# Patient Record
Sex: Female | Born: 1935 | Race: Black or African American | Hispanic: No | Smoking: Never smoker
Health system: Southern US, Community
[De-identification: ages and names within clinical notes are randomized; demographics above are authoritative.]

## PROBLEM LIST (undated history)

## (undated) DIAGNOSIS — E785 Hyperlipidemia, unspecified: Secondary | ICD-10-CM

## (undated) DIAGNOSIS — T7840XA Allergy, unspecified, initial encounter: Secondary | ICD-10-CM

## (undated) DIAGNOSIS — M1990 Unspecified osteoarthritis, unspecified site: Secondary | ICD-10-CM

## (undated) DIAGNOSIS — E119 Type 2 diabetes mellitus without complications: Secondary | ICD-10-CM

## (undated) DIAGNOSIS — I10 Essential (primary) hypertension: Secondary | ICD-10-CM

## (undated) HISTORY — DX: Hyperlipidemia, unspecified: E78.5

## (undated) HISTORY — DX: Unspecified osteoarthritis, unspecified site: M19.90

## (undated) HISTORY — DX: Essential (primary) hypertension: I10

## (undated) HISTORY — DX: Type 2 diabetes mellitus without complications: E11.9

## (undated) HISTORY — PX: BUNIONECTOMY: SHX129

## (undated) HISTORY — PX: COLONOSCOPY: SHX174

## (undated) HISTORY — DX: Allergy, unspecified, initial encounter: T78.40XA

## (undated) HISTORY — PX: ABDOMINAL HYSTERECTOMY: SHX81

## (undated) HISTORY — PX: HERNIA REPAIR: SHX51

---

## 2005-02-02 ENCOUNTER — Ambulatory Visit: Payer: Self-pay | Admitting: Family Medicine

## 2006-06-26 DIAGNOSIS — I10 Essential (primary) hypertension: Secondary | ICD-10-CM | POA: Insufficient documentation

## 2006-06-26 DIAGNOSIS — E7800 Pure hypercholesterolemia, unspecified: Secondary | ICD-10-CM | POA: Insufficient documentation

## 2007-09-06 ENCOUNTER — Ambulatory Visit: Payer: Self-pay | Admitting: Family Medicine

## 2007-09-06 IMAGING — CR DG CHEST 2V
1 series · 2 of 2 positions shown · non-contrast
Comparison: none

REASON FOR EXAM: bronchitis and cough
COMMENTS:

[Series 1: view not recorded · 0.17mm/px · 2 of 2 slices shown]
[im 1/2]
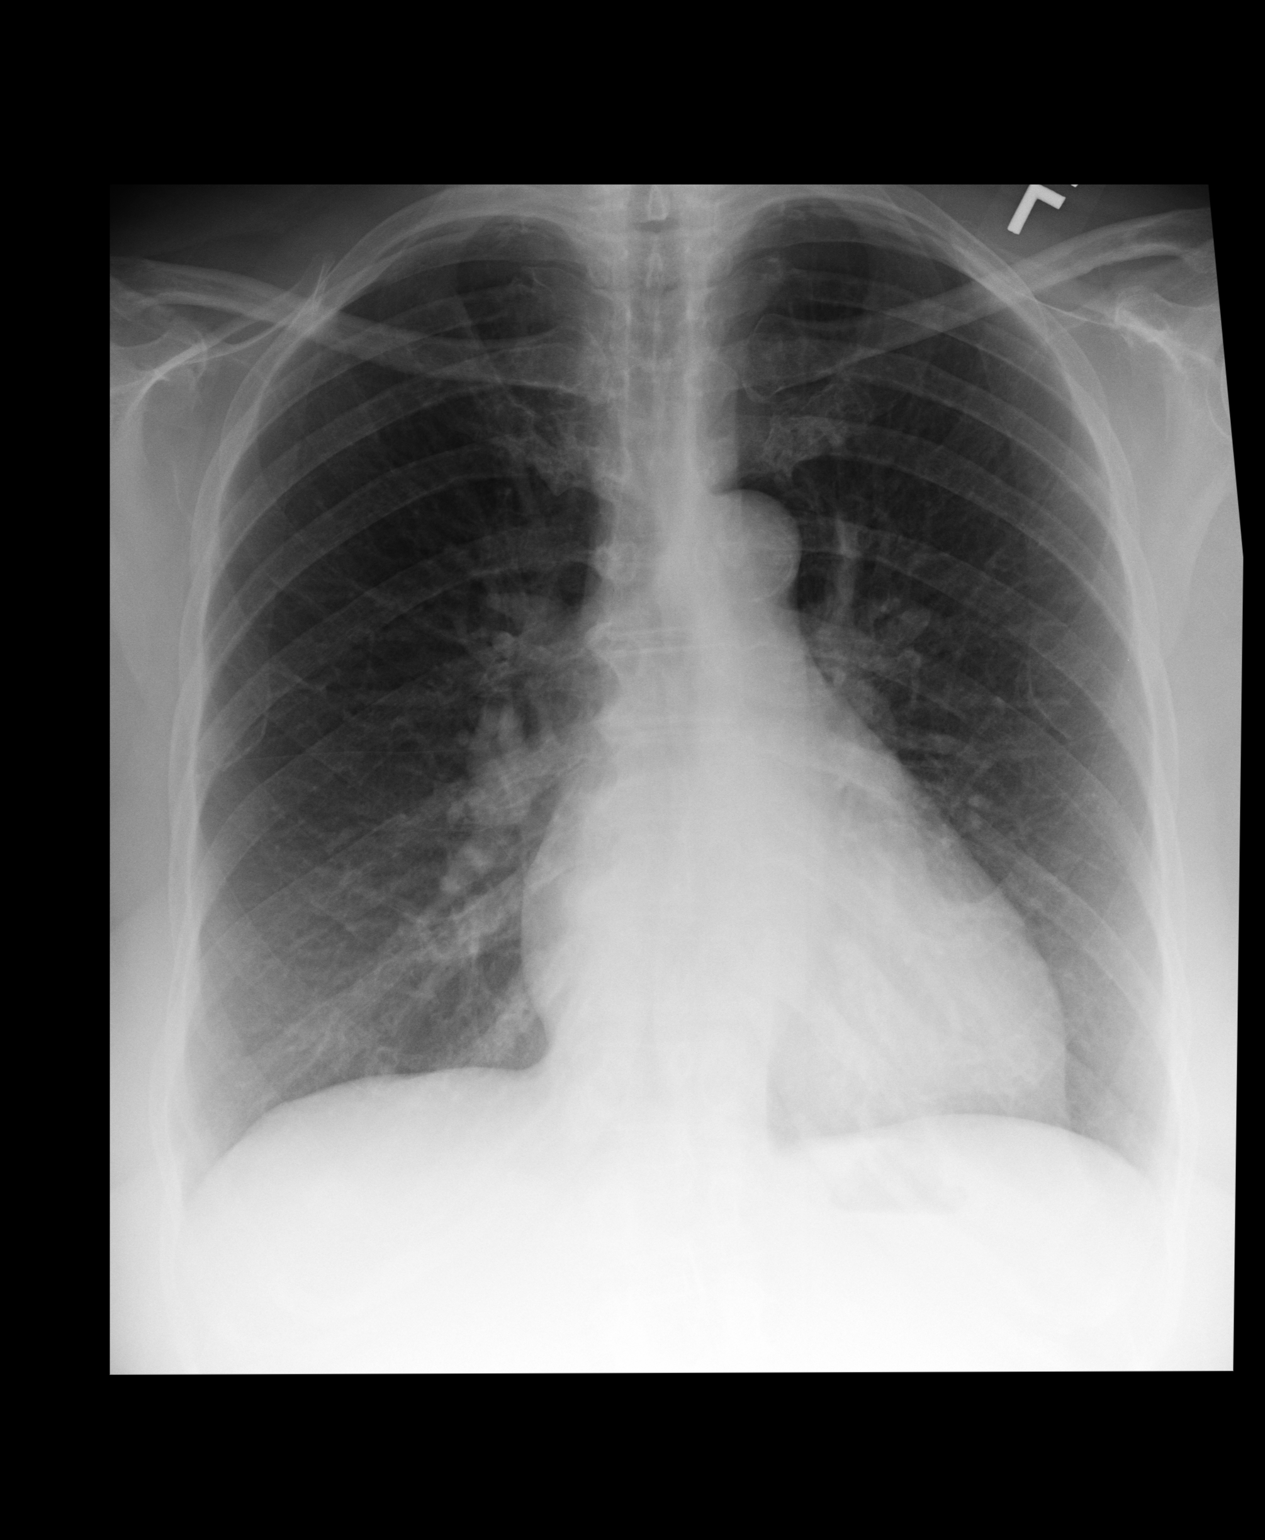
[im 2/2]
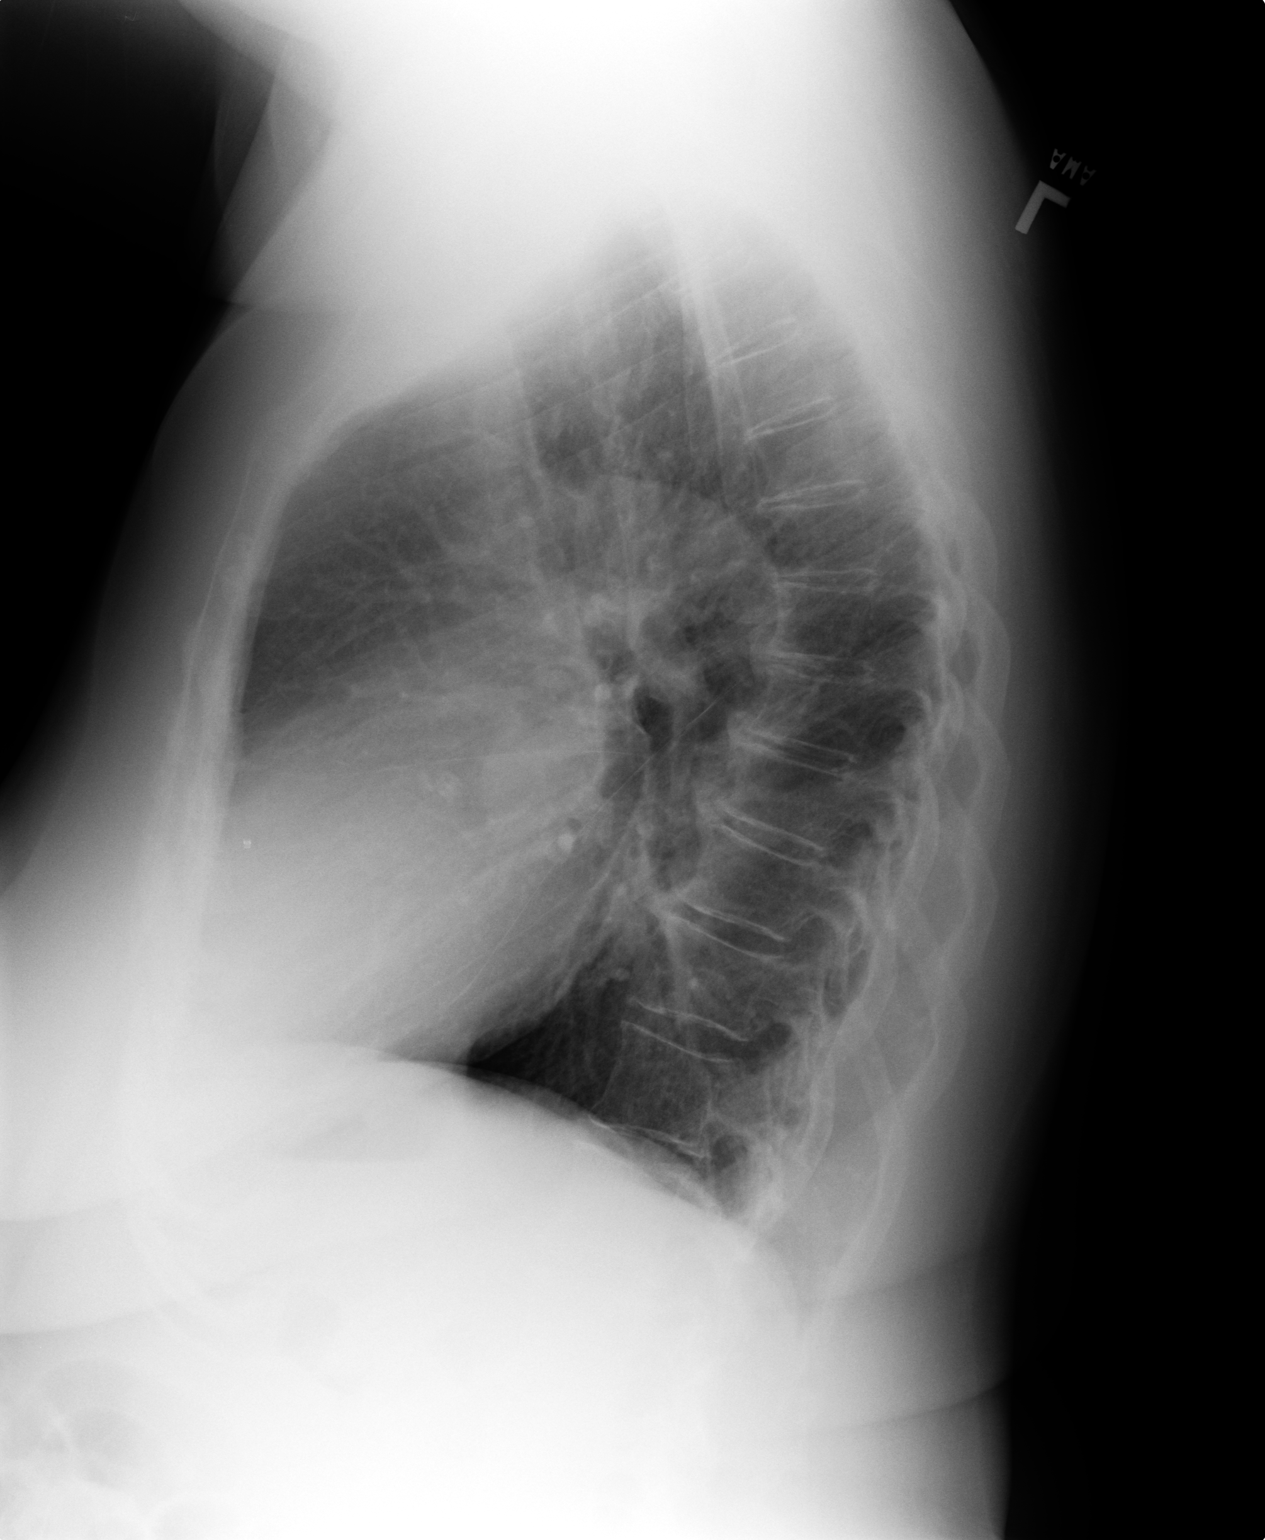

[2 of 2 positions shown; findings below may reference images not displayed]

PROCEDURE:     KDR - KDXR CHEST PA (OR AP) AND LAT  - [DATE] [DATE]

RESULT:     There is no previous exam for comparison. The heart is
borderline enlarged. Degenerative spurring is present within the spine.
Apical fibrotic changes are present in both lungs. There is no infiltrate,
effusion or pneumothorax. There is mild hyperinflation.
IMPRESSION: 1. Borderline cardiomegaly.
2. Degenerative spurring in the spine.
3. Mild hyperinflation.

## 2008-01-02 ENCOUNTER — Ambulatory Visit: Payer: Self-pay | Admitting: Ophthalmology

## 2008-01-09 ENCOUNTER — Ambulatory Visit: Payer: Self-pay | Admitting: Ophthalmology

## 2009-01-20 ENCOUNTER — Ambulatory Visit: Payer: Self-pay | Admitting: Ophthalmology

## 2009-02-03 ENCOUNTER — Ambulatory Visit: Payer: Self-pay | Admitting: Ophthalmology

## 2009-03-05 ENCOUNTER — Ambulatory Visit: Payer: Self-pay | Admitting: Ophthalmology

## 2009-03-17 ENCOUNTER — Ambulatory Visit: Payer: Self-pay | Admitting: Ophthalmology

## 2009-05-29 ENCOUNTER — Ambulatory Visit: Payer: Self-pay | Admitting: Ophthalmology

## 2009-06-10 ENCOUNTER — Ambulatory Visit: Payer: Self-pay | Admitting: Ophthalmology

## 2011-01-05 DIAGNOSIS — M5137 Other intervertebral disc degeneration, lumbosacral region: Secondary | ICD-10-CM | POA: Diagnosis not present

## 2011-01-05 DIAGNOSIS — M999 Biomechanical lesion, unspecified: Secondary | ICD-10-CM | POA: Diagnosis not present

## 2011-01-05 DIAGNOSIS — M546 Pain in thoracic spine: Secondary | ICD-10-CM | POA: Diagnosis not present

## 2011-01-28 DIAGNOSIS — B351 Tinea unguium: Secondary | ICD-10-CM | POA: Diagnosis not present

## 2011-01-28 DIAGNOSIS — L851 Acquired keratosis [keratoderma] palmaris et plantaris: Secondary | ICD-10-CM | POA: Diagnosis not present

## 2011-01-28 DIAGNOSIS — E119 Type 2 diabetes mellitus without complications: Secondary | ICD-10-CM | POA: Diagnosis not present

## 2011-02-08 DIAGNOSIS — M546 Pain in thoracic spine: Secondary | ICD-10-CM | POA: Diagnosis not present

## 2011-02-08 DIAGNOSIS — M999 Biomechanical lesion, unspecified: Secondary | ICD-10-CM | POA: Diagnosis not present

## 2011-02-08 DIAGNOSIS — M5137 Other intervertebral disc degeneration, lumbosacral region: Secondary | ICD-10-CM | POA: Diagnosis not present

## 2011-02-15 DIAGNOSIS — M546 Pain in thoracic spine: Secondary | ICD-10-CM | POA: Diagnosis not present

## 2011-02-15 DIAGNOSIS — M5137 Other intervertebral disc degeneration, lumbosacral region: Secondary | ICD-10-CM | POA: Diagnosis not present

## 2011-02-15 DIAGNOSIS — M999 Biomechanical lesion, unspecified: Secondary | ICD-10-CM | POA: Diagnosis not present

## 2011-02-23 DIAGNOSIS — M546 Pain in thoracic spine: Secondary | ICD-10-CM | POA: Diagnosis not present

## 2011-02-23 DIAGNOSIS — M999 Biomechanical lesion, unspecified: Secondary | ICD-10-CM | POA: Diagnosis not present

## 2011-02-23 DIAGNOSIS — M5137 Other intervertebral disc degeneration, lumbosacral region: Secondary | ICD-10-CM | POA: Diagnosis not present

## 2011-03-02 DIAGNOSIS — M546 Pain in thoracic spine: Secondary | ICD-10-CM | POA: Diagnosis not present

## 2011-03-02 DIAGNOSIS — M999 Biomechanical lesion, unspecified: Secondary | ICD-10-CM | POA: Diagnosis not present

## 2011-03-02 DIAGNOSIS — M5137 Other intervertebral disc degeneration, lumbosacral region: Secondary | ICD-10-CM | POA: Diagnosis not present

## 2011-03-09 DIAGNOSIS — M999 Biomechanical lesion, unspecified: Secondary | ICD-10-CM | POA: Diagnosis not present

## 2011-03-09 DIAGNOSIS — M5137 Other intervertebral disc degeneration, lumbosacral region: Secondary | ICD-10-CM | POA: Diagnosis not present

## 2011-03-09 DIAGNOSIS — M546 Pain in thoracic spine: Secondary | ICD-10-CM | POA: Diagnosis not present

## 2011-03-11 DIAGNOSIS — E1139 Type 2 diabetes mellitus with other diabetic ophthalmic complication: Secondary | ICD-10-CM | POA: Diagnosis not present

## 2011-03-11 DIAGNOSIS — E11359 Type 2 diabetes mellitus with proliferative diabetic retinopathy without macular edema: Secondary | ICD-10-CM | POA: Diagnosis not present

## 2011-03-16 DIAGNOSIS — M999 Biomechanical lesion, unspecified: Secondary | ICD-10-CM | POA: Diagnosis not present

## 2011-03-16 DIAGNOSIS — M5137 Other intervertebral disc degeneration, lumbosacral region: Secondary | ICD-10-CM | POA: Diagnosis not present

## 2011-03-16 DIAGNOSIS — M546 Pain in thoracic spine: Secondary | ICD-10-CM | POA: Diagnosis not present

## 2011-03-30 DIAGNOSIS — M5137 Other intervertebral disc degeneration, lumbosacral region: Secondary | ICD-10-CM | POA: Diagnosis not present

## 2011-03-30 DIAGNOSIS — M546 Pain in thoracic spine: Secondary | ICD-10-CM | POA: Diagnosis not present

## 2011-03-30 DIAGNOSIS — M999 Biomechanical lesion, unspecified: Secondary | ICD-10-CM | POA: Diagnosis not present

## 2011-04-04 DIAGNOSIS — E119 Type 2 diabetes mellitus without complications: Secondary | ICD-10-CM | POA: Diagnosis not present

## 2011-04-04 DIAGNOSIS — B351 Tinea unguium: Secondary | ICD-10-CM | POA: Diagnosis not present

## 2011-04-04 DIAGNOSIS — L851 Acquired keratosis [keratoderma] palmaris et plantaris: Secondary | ICD-10-CM | POA: Diagnosis not present

## 2011-04-05 DIAGNOSIS — M546 Pain in thoracic spine: Secondary | ICD-10-CM | POA: Diagnosis not present

## 2011-04-05 DIAGNOSIS — M999 Biomechanical lesion, unspecified: Secondary | ICD-10-CM | POA: Diagnosis not present

## 2011-04-05 DIAGNOSIS — M5137 Other intervertebral disc degeneration, lumbosacral region: Secondary | ICD-10-CM | POA: Diagnosis not present

## 2011-04-13 DIAGNOSIS — M999 Biomechanical lesion, unspecified: Secondary | ICD-10-CM | POA: Diagnosis not present

## 2011-04-13 DIAGNOSIS — M546 Pain in thoracic spine: Secondary | ICD-10-CM | POA: Diagnosis not present

## 2011-04-13 DIAGNOSIS — M5137 Other intervertebral disc degeneration, lumbosacral region: Secondary | ICD-10-CM | POA: Diagnosis not present

## 2011-04-19 DIAGNOSIS — E119 Type 2 diabetes mellitus without complications: Secondary | ICD-10-CM | POA: Diagnosis not present

## 2011-04-19 DIAGNOSIS — J301 Allergic rhinitis due to pollen: Secondary | ICD-10-CM | POA: Diagnosis not present

## 2011-04-19 DIAGNOSIS — E7800 Pure hypercholesterolemia, unspecified: Secondary | ICD-10-CM | POA: Diagnosis not present

## 2011-04-19 DIAGNOSIS — I10 Essential (primary) hypertension: Secondary | ICD-10-CM | POA: Diagnosis not present

## 2011-04-27 DIAGNOSIS — M999 Biomechanical lesion, unspecified: Secondary | ICD-10-CM | POA: Diagnosis not present

## 2011-04-27 DIAGNOSIS — M546 Pain in thoracic spine: Secondary | ICD-10-CM | POA: Diagnosis not present

## 2011-04-27 DIAGNOSIS — M5137 Other intervertebral disc degeneration, lumbosacral region: Secondary | ICD-10-CM | POA: Diagnosis not present

## 2011-06-06 DIAGNOSIS — L851 Acquired keratosis [keratoderma] palmaris et plantaris: Secondary | ICD-10-CM | POA: Diagnosis not present

## 2011-06-06 DIAGNOSIS — B351 Tinea unguium: Secondary | ICD-10-CM | POA: Diagnosis not present

## 2011-06-06 DIAGNOSIS — E119 Type 2 diabetes mellitus without complications: Secondary | ICD-10-CM | POA: Diagnosis not present

## 2011-08-15 DIAGNOSIS — E119 Type 2 diabetes mellitus without complications: Secondary | ICD-10-CM | POA: Diagnosis not present

## 2011-08-15 DIAGNOSIS — B351 Tinea unguium: Secondary | ICD-10-CM | POA: Diagnosis not present

## 2011-08-15 DIAGNOSIS — L851 Acquired keratosis [keratoderma] palmaris et plantaris: Secondary | ICD-10-CM | POA: Diagnosis not present

## 2011-08-22 DIAGNOSIS — E7800 Pure hypercholesterolemia, unspecified: Secondary | ICD-10-CM | POA: Diagnosis not present

## 2011-08-22 DIAGNOSIS — I10 Essential (primary) hypertension: Secondary | ICD-10-CM | POA: Diagnosis not present

## 2011-08-22 DIAGNOSIS — M109 Gout, unspecified: Secondary | ICD-10-CM | POA: Diagnosis not present

## 2011-10-03 DIAGNOSIS — Z23 Encounter for immunization: Secondary | ICD-10-CM | POA: Diagnosis not present

## 2011-10-17 DIAGNOSIS — B351 Tinea unguium: Secondary | ICD-10-CM | POA: Diagnosis not present

## 2011-10-17 DIAGNOSIS — E119 Type 2 diabetes mellitus without complications: Secondary | ICD-10-CM | POA: Diagnosis not present

## 2011-10-17 DIAGNOSIS — L851 Acquired keratosis [keratoderma] palmaris et plantaris: Secondary | ICD-10-CM | POA: Diagnosis not present

## 2011-12-19 DIAGNOSIS — E119 Type 2 diabetes mellitus without complications: Secondary | ICD-10-CM | POA: Diagnosis not present

## 2011-12-19 DIAGNOSIS — L851 Acquired keratosis [keratoderma] palmaris et plantaris: Secondary | ICD-10-CM | POA: Diagnosis not present

## 2011-12-19 DIAGNOSIS — B351 Tinea unguium: Secondary | ICD-10-CM | POA: Diagnosis not present

## 2012-01-02 DIAGNOSIS — E7800 Pure hypercholesterolemia, unspecified: Secondary | ICD-10-CM | POA: Diagnosis not present

## 2012-01-02 DIAGNOSIS — E119 Type 2 diabetes mellitus without complications: Secondary | ICD-10-CM | POA: Diagnosis not present

## 2012-01-02 DIAGNOSIS — M109 Gout, unspecified: Secondary | ICD-10-CM | POA: Diagnosis not present

## 2012-01-02 DIAGNOSIS — I10 Essential (primary) hypertension: Secondary | ICD-10-CM | POA: Diagnosis not present

## 2012-01-10 DIAGNOSIS — E7800 Pure hypercholesterolemia, unspecified: Secondary | ICD-10-CM | POA: Diagnosis not present

## 2012-01-10 DIAGNOSIS — M109 Gout, unspecified: Secondary | ICD-10-CM | POA: Diagnosis not present

## 2012-02-20 DIAGNOSIS — L851 Acquired keratosis [keratoderma] palmaris et plantaris: Secondary | ICD-10-CM | POA: Diagnosis not present

## 2012-02-20 DIAGNOSIS — B351 Tinea unguium: Secondary | ICD-10-CM | POA: Diagnosis not present

## 2012-02-20 DIAGNOSIS — E119 Type 2 diabetes mellitus without complications: Secondary | ICD-10-CM | POA: Diagnosis not present

## 2012-04-23 DIAGNOSIS — E119 Type 2 diabetes mellitus without complications: Secondary | ICD-10-CM | POA: Diagnosis not present

## 2012-04-23 DIAGNOSIS — B351 Tinea unguium: Secondary | ICD-10-CM | POA: Diagnosis not present

## 2012-04-23 DIAGNOSIS — L851 Acquired keratosis [keratoderma] palmaris et plantaris: Secondary | ICD-10-CM | POA: Diagnosis not present

## 2012-05-29 IMAGING — CR DG HIP COMPLETE 2+V*L*
1 series · 3 of 3 positions shown · non-contrast
Comparison: None.

CLINICAL DATA: Lower back pain radiating into the left hip.

EXAM:
LEFT HIP - COMPLETE 2+ VIEW

[Series 1: ap · 0.17mm/px · 3 of 3 slices shown]
[im 1/3]
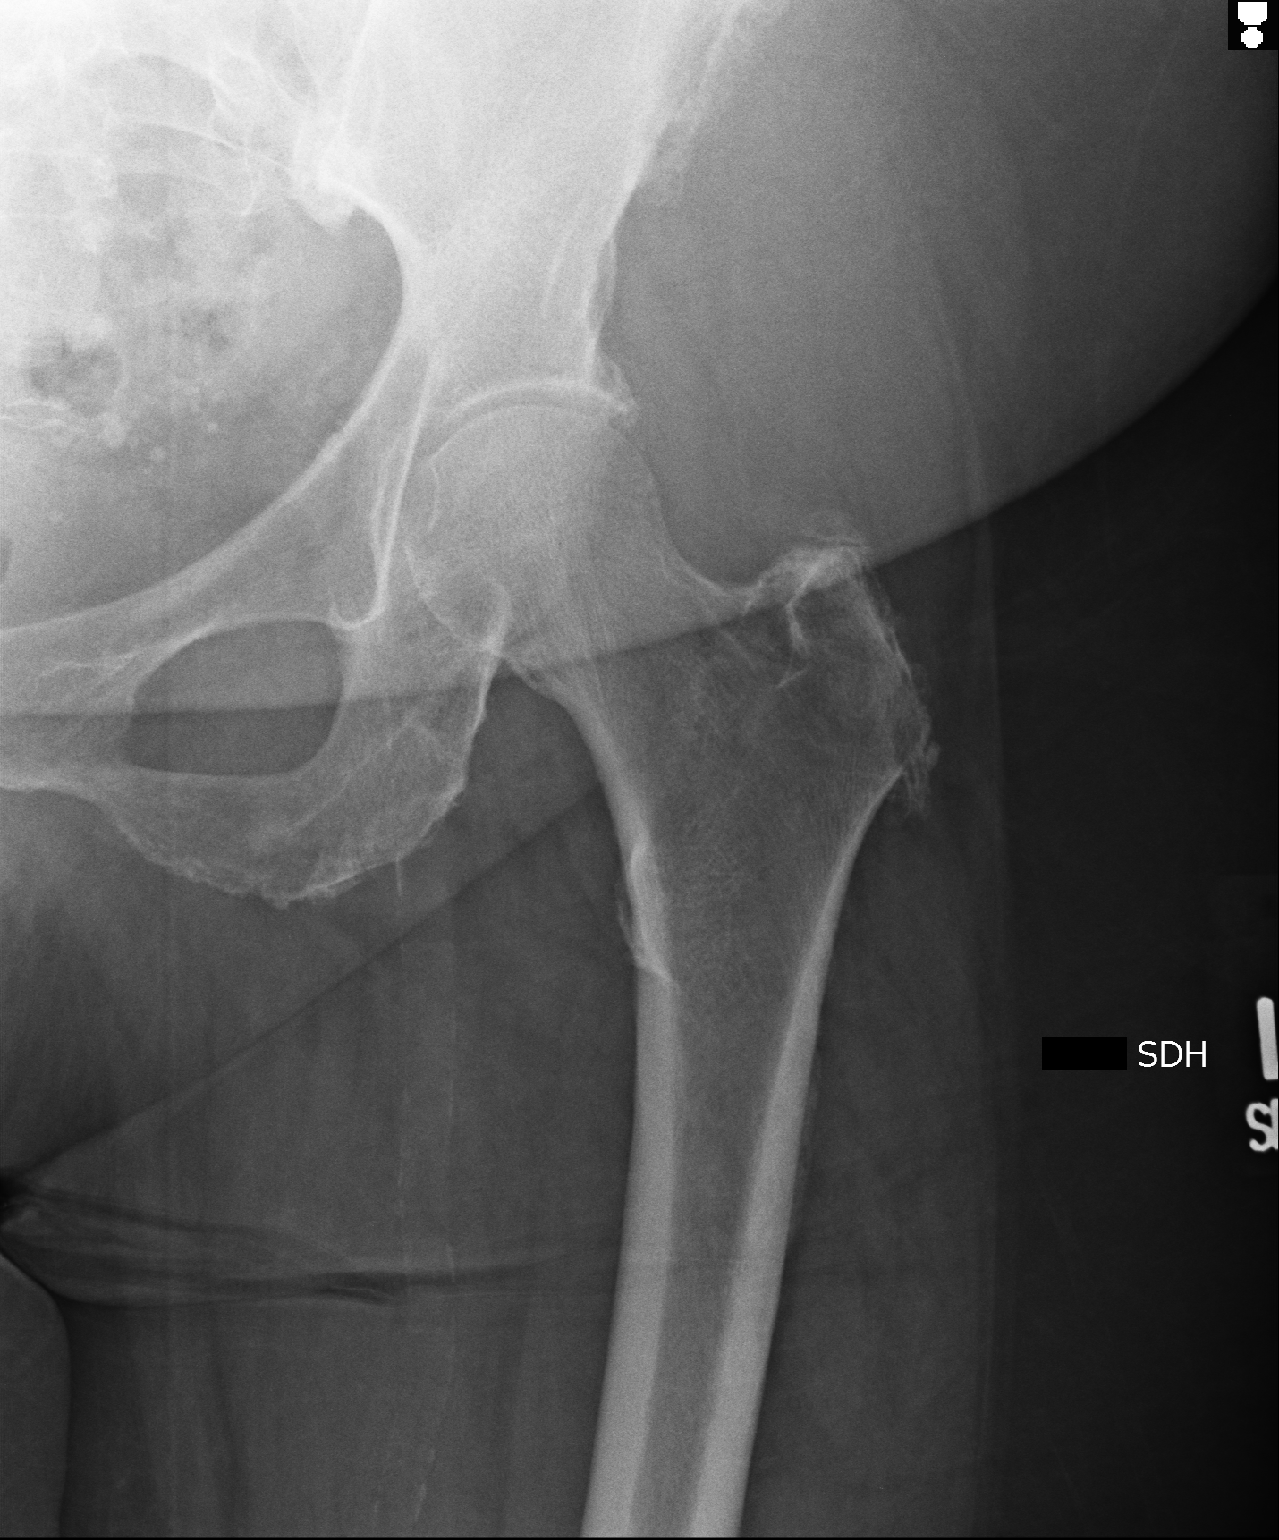
[im 2/3]
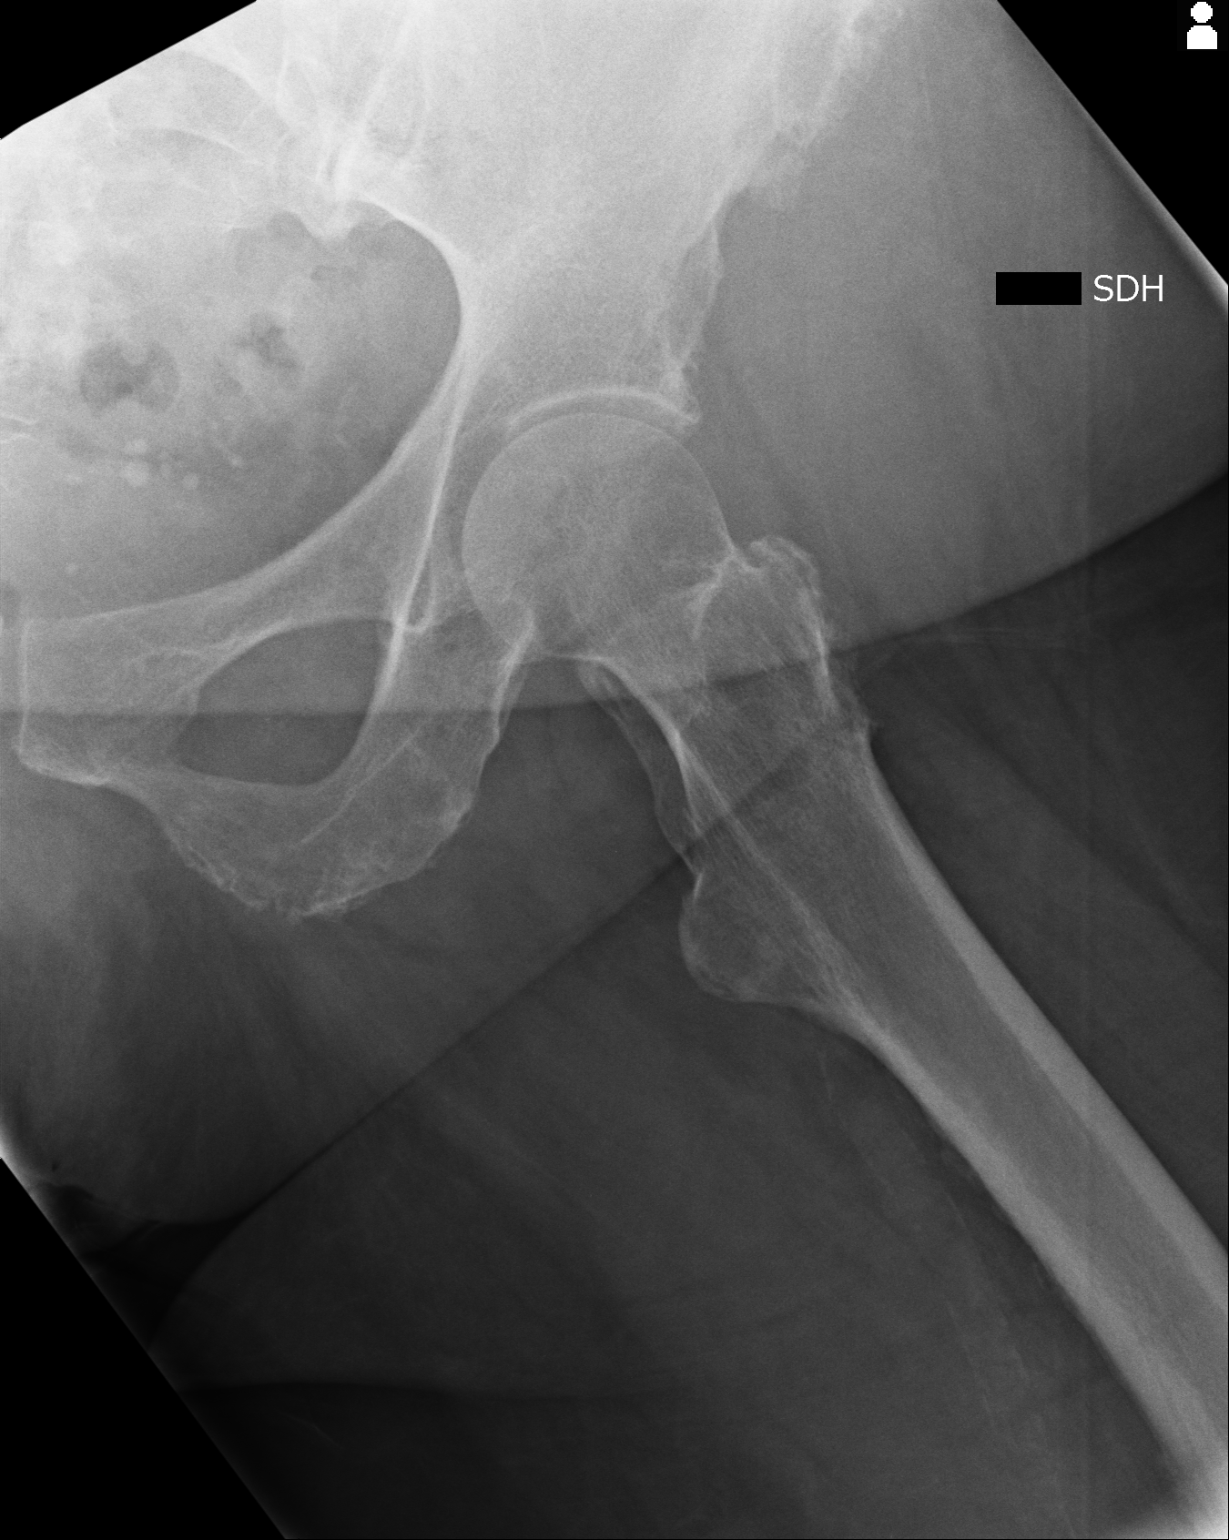
[im 3/3]
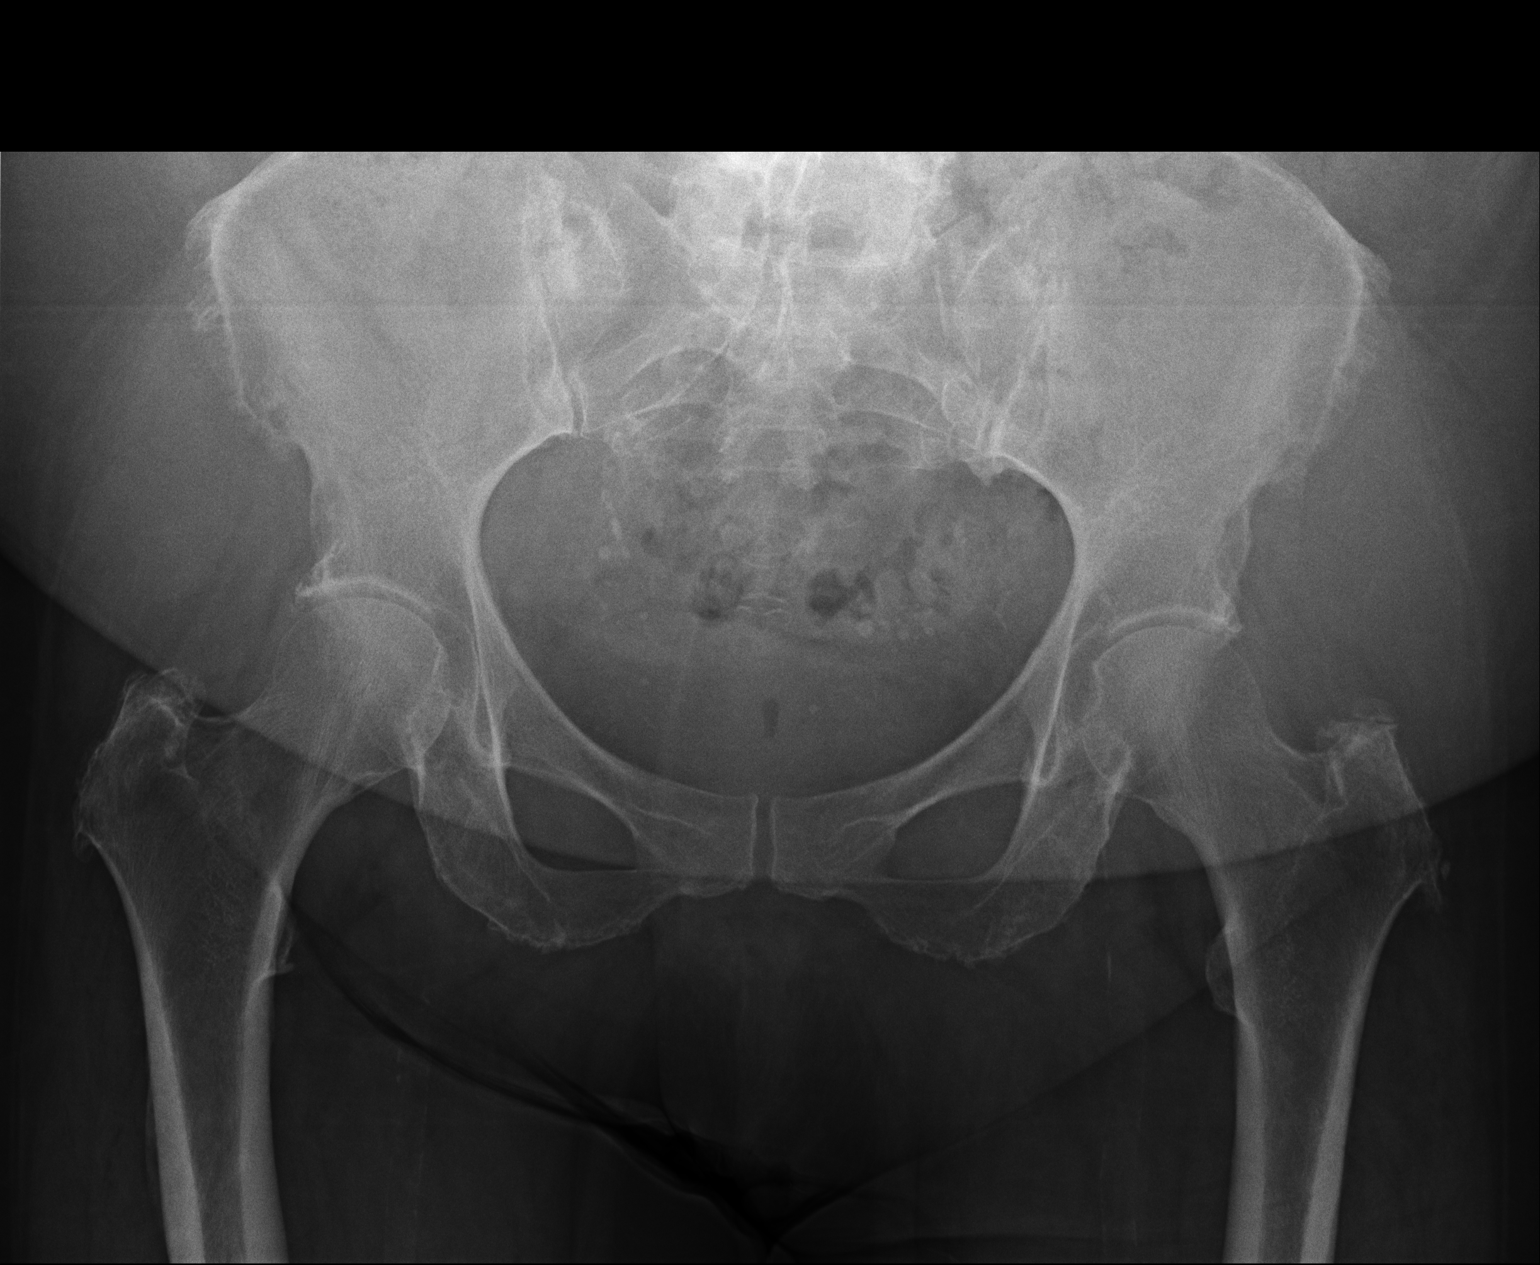

[3 of 3 positions shown; findings below may reference images not displayed]

FINDINGS: There is no evidence of hip fracture or dislocation. There is no
evidence of arthropathy or other focal bone abnormality.
Degenerative changes are seen in the lower lumbar spine. Phleboliths
are seen in the pelvis.
IMPRESSION: No evidence for acute  abnormality.

## 2012-06-25 DIAGNOSIS — E119 Type 2 diabetes mellitus without complications: Secondary | ICD-10-CM | POA: Diagnosis not present

## 2012-06-25 DIAGNOSIS — B351 Tinea unguium: Secondary | ICD-10-CM | POA: Diagnosis not present

## 2012-06-25 DIAGNOSIS — L851 Acquired keratosis [keratoderma] palmaris et plantaris: Secondary | ICD-10-CM | POA: Diagnosis not present

## 2012-08-28 ENCOUNTER — Ambulatory Visit: Payer: Self-pay | Admitting: Family Medicine

## 2012-08-28 DIAGNOSIS — R928 Other abnormal and inconclusive findings on diagnostic imaging of breast: Secondary | ICD-10-CM | POA: Diagnosis not present

## 2012-08-28 DIAGNOSIS — B351 Tinea unguium: Secondary | ICD-10-CM | POA: Diagnosis not present

## 2012-08-28 DIAGNOSIS — L851 Acquired keratosis [keratoderma] palmaris et plantaris: Secondary | ICD-10-CM | POA: Diagnosis not present

## 2012-08-28 DIAGNOSIS — Z1231 Encounter for screening mammogram for malignant neoplasm of breast: Secondary | ICD-10-CM | POA: Diagnosis not present

## 2012-08-28 DIAGNOSIS — E119 Type 2 diabetes mellitus without complications: Secondary | ICD-10-CM | POA: Diagnosis not present

## 2012-09-12 ENCOUNTER — Ambulatory Visit: Payer: Self-pay | Admitting: Family Medicine

## 2012-09-12 DIAGNOSIS — N63 Unspecified lump in unspecified breast: Secondary | ICD-10-CM | POA: Diagnosis not present

## 2012-10-02 DIAGNOSIS — L989 Disorder of the skin and subcutaneous tissue, unspecified: Secondary | ICD-10-CM | POA: Diagnosis not present

## 2012-10-02 DIAGNOSIS — Z23 Encounter for immunization: Secondary | ICD-10-CM | POA: Diagnosis not present

## 2012-10-17 DIAGNOSIS — L723 Sebaceous cyst: Secondary | ICD-10-CM | POA: Diagnosis not present

## 2012-10-30 DIAGNOSIS — B351 Tinea unguium: Secondary | ICD-10-CM | POA: Diagnosis not present

## 2012-10-30 DIAGNOSIS — E119 Type 2 diabetes mellitus without complications: Secondary | ICD-10-CM | POA: Diagnosis not present

## 2012-10-30 DIAGNOSIS — L851 Acquired keratosis [keratoderma] palmaris et plantaris: Secondary | ICD-10-CM | POA: Diagnosis not present

## 2013-01-08 DIAGNOSIS — L851 Acquired keratosis [keratoderma] palmaris et plantaris: Secondary | ICD-10-CM | POA: Diagnosis not present

## 2013-01-08 DIAGNOSIS — E669 Obesity, unspecified: Secondary | ICD-10-CM | POA: Diagnosis not present

## 2013-01-08 DIAGNOSIS — E7800 Pure hypercholesterolemia, unspecified: Secondary | ICD-10-CM | POA: Diagnosis not present

## 2013-01-08 DIAGNOSIS — B351 Tinea unguium: Secondary | ICD-10-CM | POA: Diagnosis not present

## 2013-01-08 DIAGNOSIS — E119 Type 2 diabetes mellitus without complications: Secondary | ICD-10-CM | POA: Diagnosis not present

## 2013-01-08 DIAGNOSIS — I10 Essential (primary) hypertension: Secondary | ICD-10-CM | POA: Diagnosis not present

## 2013-03-12 DIAGNOSIS — L851 Acquired keratosis [keratoderma] palmaris et plantaris: Secondary | ICD-10-CM | POA: Diagnosis not present

## 2013-03-12 DIAGNOSIS — B351 Tinea unguium: Secondary | ICD-10-CM | POA: Diagnosis not present

## 2013-03-12 DIAGNOSIS — E119 Type 2 diabetes mellitus without complications: Secondary | ICD-10-CM | POA: Diagnosis not present

## 2013-05-14 DIAGNOSIS — B351 Tinea unguium: Secondary | ICD-10-CM | POA: Diagnosis not present

## 2013-05-14 DIAGNOSIS — E119 Type 2 diabetes mellitus without complications: Secondary | ICD-10-CM | POA: Diagnosis not present

## 2013-05-14 DIAGNOSIS — E7800 Pure hypercholesterolemia, unspecified: Secondary | ICD-10-CM | POA: Diagnosis not present

## 2013-05-14 DIAGNOSIS — L851 Acquired keratosis [keratoderma] palmaris et plantaris: Secondary | ICD-10-CM | POA: Diagnosis not present

## 2013-05-14 DIAGNOSIS — I10 Essential (primary) hypertension: Secondary | ICD-10-CM | POA: Diagnosis not present

## 2013-06-10 ENCOUNTER — Ambulatory Visit: Payer: Self-pay | Admitting: Family Medicine

## 2013-06-10 DIAGNOSIS — R109 Unspecified abdominal pain: Secondary | ICD-10-CM | POA: Diagnosis not present

## 2013-06-10 DIAGNOSIS — M76899 Other specified enthesopathies of unspecified lower limb, excluding foot: Secondary | ICD-10-CM | POA: Diagnosis not present

## 2013-06-10 DIAGNOSIS — M25559 Pain in unspecified hip: Secondary | ICD-10-CM | POA: Diagnosis not present

## 2013-06-10 DIAGNOSIS — M5137 Other intervertebral disc degeneration, lumbosacral region: Secondary | ICD-10-CM | POA: Diagnosis not present

## 2013-06-10 DIAGNOSIS — M47817 Spondylosis without myelopathy or radiculopathy, lumbosacral region: Secondary | ICD-10-CM | POA: Diagnosis not present

## 2013-06-10 DIAGNOSIS — IMO0002 Reserved for concepts with insufficient information to code with codable children: Secondary | ICD-10-CM | POA: Diagnosis not present

## 2013-06-10 DIAGNOSIS — M4310 Spondylolisthesis, site unspecified: Secondary | ICD-10-CM | POA: Diagnosis not present

## 2013-06-10 IMAGING — CR DG LUMBAR SPINE COMPLETE 4+V
1 series · 5 of 5 positions shown · non-contrast
Comparison: None.

CLINICAL DATA: Lower back pain radiating into the left hip.

EXAM:
LUMBAR SPINE - COMPLETE 4+ VIEW

[Series 1: ap · 0.17mm/px · 5 of 5 slices shown]
[im 1/5]
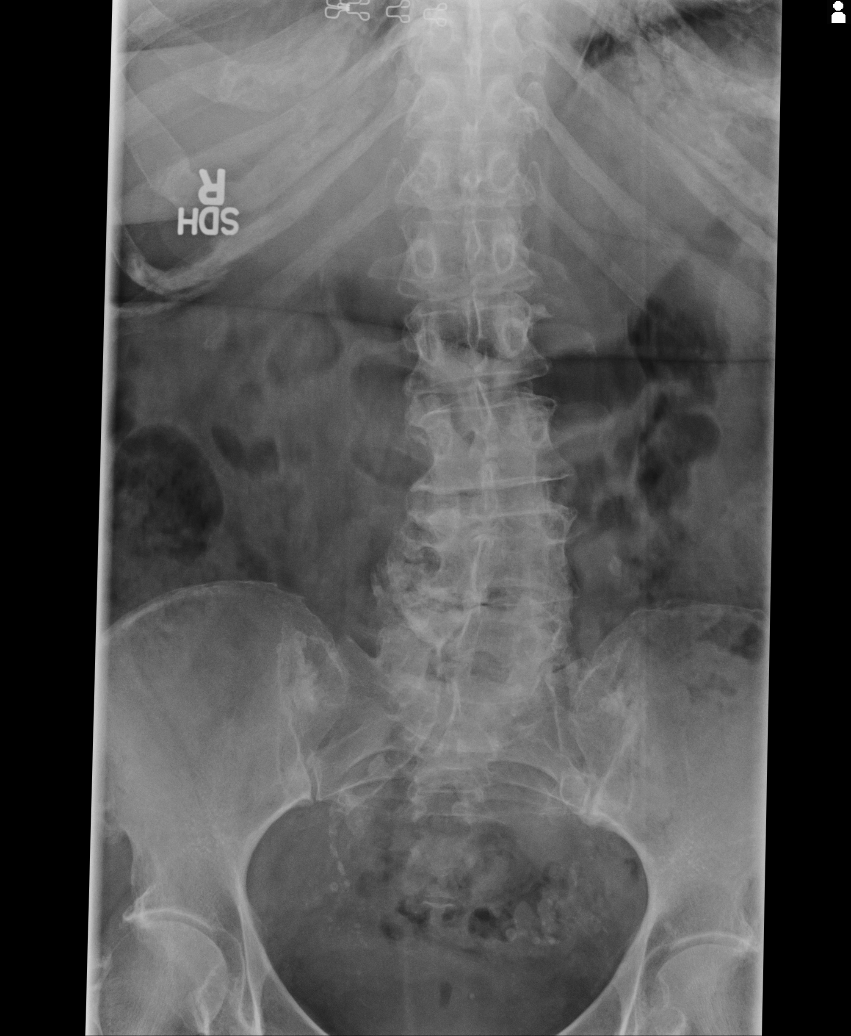
[im 2/5]
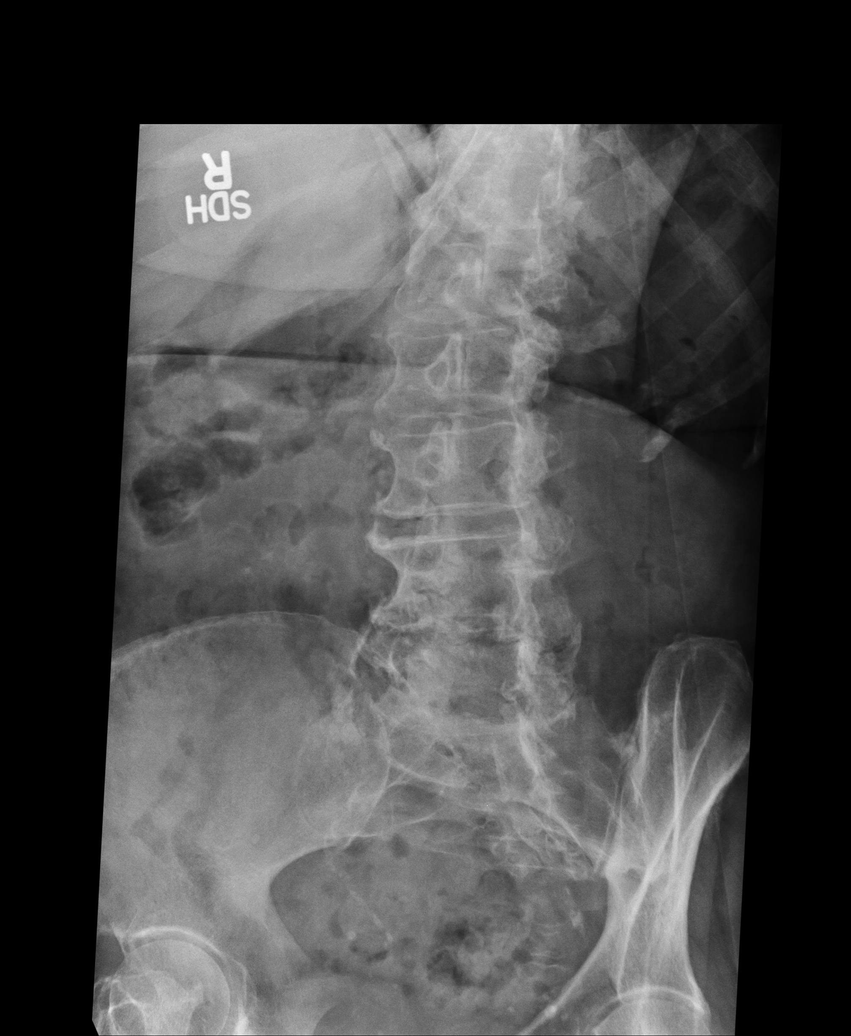
[im 3/5]
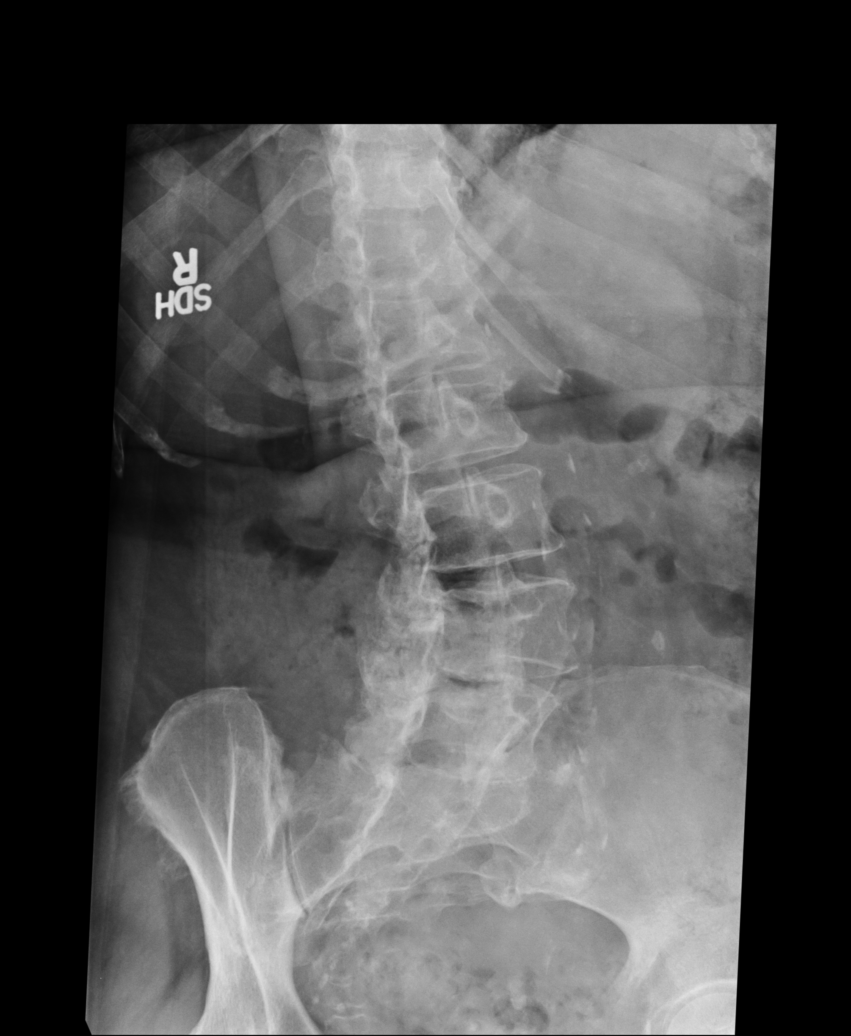
[im 4/5]
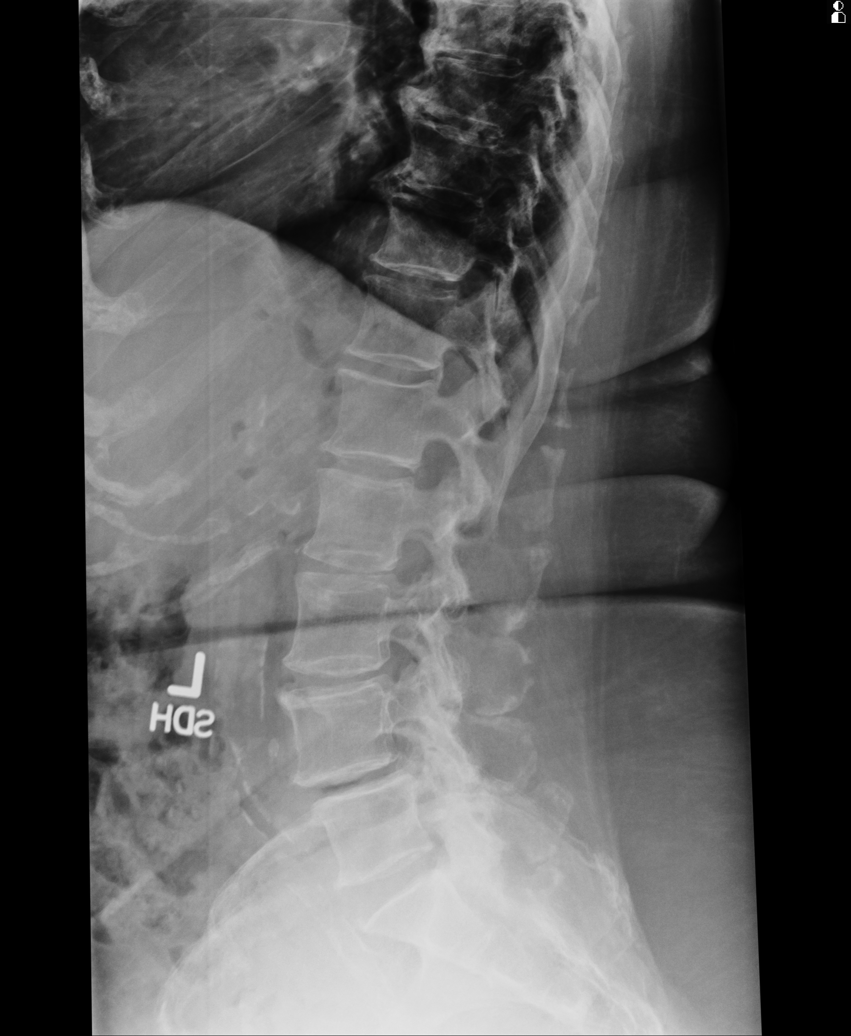
[im 5/5]
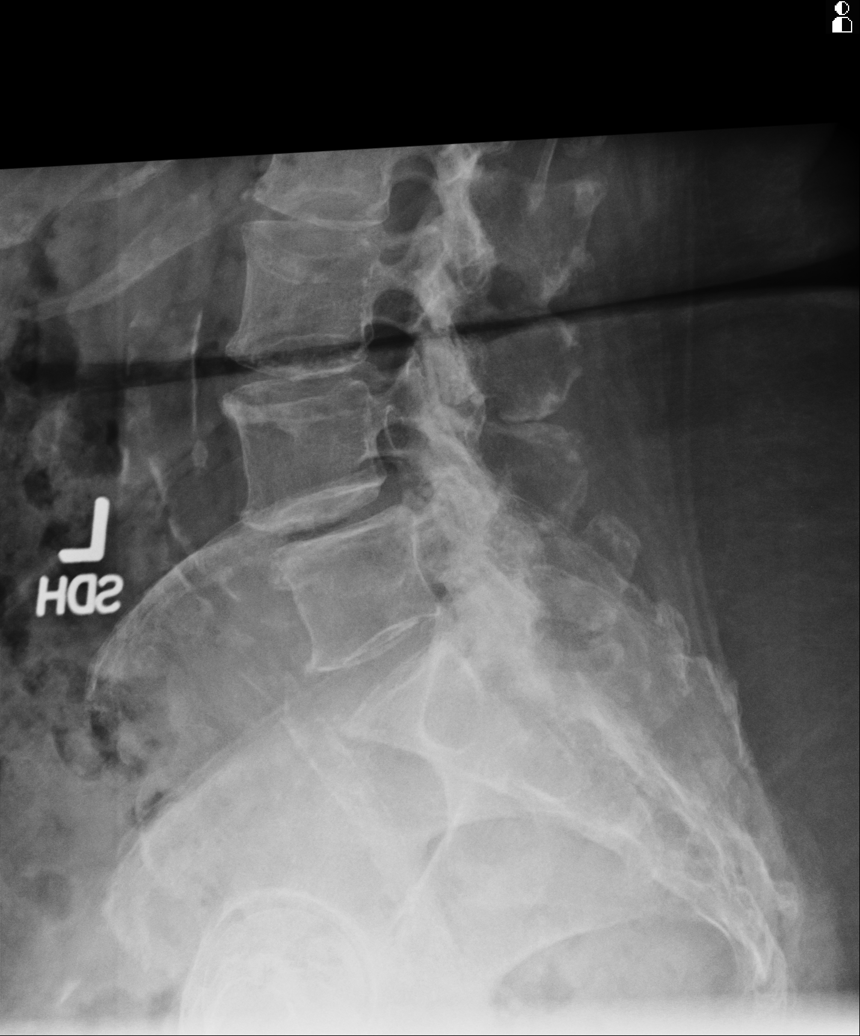

[5 of 5 positions shown; findings below may reference images not displayed]

FINDINGS: Degenerative changes are seen in the lower lumbar spine, with
notable significant facet hypertrophy at L3-4, L4-5, and L5-S1.
There is 5 mm of anterolisthesis of L4 on L5. No acute fracture. No
suspicious lytic or blastic lesions identified. There is
atherosclerotic calcification of the abdominal aorta.
IMPRESSION: 1. Degenerative changes.
2.  No evidence for acute  abnormality.

## 2013-06-12 DIAGNOSIS — M546 Pain in thoracic spine: Secondary | ICD-10-CM | POA: Diagnosis not present

## 2013-06-12 DIAGNOSIS — M9981 Other biomechanical lesions of cervical region: Secondary | ICD-10-CM | POA: Diagnosis not present

## 2013-06-12 DIAGNOSIS — M545 Low back pain, unspecified: Secondary | ICD-10-CM | POA: Diagnosis not present

## 2013-06-12 DIAGNOSIS — M999 Biomechanical lesion, unspecified: Secondary | ICD-10-CM | POA: Diagnosis not present

## 2013-06-12 DIAGNOSIS — M542 Cervicalgia: Secondary | ICD-10-CM | POA: Diagnosis not present

## 2013-06-14 DIAGNOSIS — M545 Low back pain, unspecified: Secondary | ICD-10-CM | POA: Diagnosis not present

## 2013-06-14 DIAGNOSIS — M999 Biomechanical lesion, unspecified: Secondary | ICD-10-CM | POA: Diagnosis not present

## 2013-06-14 DIAGNOSIS — M542 Cervicalgia: Secondary | ICD-10-CM | POA: Diagnosis not present

## 2013-06-14 DIAGNOSIS — M546 Pain in thoracic spine: Secondary | ICD-10-CM | POA: Diagnosis not present

## 2013-06-14 DIAGNOSIS — M9981 Other biomechanical lesions of cervical region: Secondary | ICD-10-CM | POA: Diagnosis not present

## 2013-06-17 DIAGNOSIS — M999 Biomechanical lesion, unspecified: Secondary | ICD-10-CM | POA: Diagnosis not present

## 2013-06-17 DIAGNOSIS — M542 Cervicalgia: Secondary | ICD-10-CM | POA: Diagnosis not present

## 2013-06-17 DIAGNOSIS — M546 Pain in thoracic spine: Secondary | ICD-10-CM | POA: Diagnosis not present

## 2013-06-17 DIAGNOSIS — M545 Low back pain, unspecified: Secondary | ICD-10-CM | POA: Diagnosis not present

## 2013-06-17 DIAGNOSIS — M9981 Other biomechanical lesions of cervical region: Secondary | ICD-10-CM | POA: Diagnosis not present

## 2013-06-19 DIAGNOSIS — M545 Low back pain, unspecified: Secondary | ICD-10-CM | POA: Diagnosis not present

## 2013-06-19 DIAGNOSIS — M546 Pain in thoracic spine: Secondary | ICD-10-CM | POA: Diagnosis not present

## 2013-06-19 DIAGNOSIS — M542 Cervicalgia: Secondary | ICD-10-CM | POA: Diagnosis not present

## 2013-06-19 DIAGNOSIS — M9981 Other biomechanical lesions of cervical region: Secondary | ICD-10-CM | POA: Diagnosis not present

## 2013-06-19 DIAGNOSIS — M999 Biomechanical lesion, unspecified: Secondary | ICD-10-CM | POA: Diagnosis not present

## 2013-06-21 DIAGNOSIS — M546 Pain in thoracic spine: Secondary | ICD-10-CM | POA: Diagnosis not present

## 2013-06-21 DIAGNOSIS — M999 Biomechanical lesion, unspecified: Secondary | ICD-10-CM | POA: Diagnosis not present

## 2013-06-21 DIAGNOSIS — M545 Low back pain, unspecified: Secondary | ICD-10-CM | POA: Diagnosis not present

## 2013-06-21 DIAGNOSIS — M9981 Other biomechanical lesions of cervical region: Secondary | ICD-10-CM | POA: Diagnosis not present

## 2013-06-21 DIAGNOSIS — M542 Cervicalgia: Secondary | ICD-10-CM | POA: Diagnosis not present

## 2013-06-24 DIAGNOSIS — M9981 Other biomechanical lesions of cervical region: Secondary | ICD-10-CM | POA: Diagnosis not present

## 2013-06-24 DIAGNOSIS — M545 Low back pain, unspecified: Secondary | ICD-10-CM | POA: Diagnosis not present

## 2013-06-24 DIAGNOSIS — M542 Cervicalgia: Secondary | ICD-10-CM | POA: Diagnosis not present

## 2013-06-24 DIAGNOSIS — M546 Pain in thoracic spine: Secondary | ICD-10-CM | POA: Diagnosis not present

## 2013-06-24 DIAGNOSIS — M999 Biomechanical lesion, unspecified: Secondary | ICD-10-CM | POA: Diagnosis not present

## 2013-06-25 DIAGNOSIS — M999 Biomechanical lesion, unspecified: Secondary | ICD-10-CM | POA: Diagnosis not present

## 2013-06-25 DIAGNOSIS — M9981 Other biomechanical lesions of cervical region: Secondary | ICD-10-CM | POA: Diagnosis not present

## 2013-06-25 DIAGNOSIS — M545 Low back pain, unspecified: Secondary | ICD-10-CM | POA: Diagnosis not present

## 2013-06-25 DIAGNOSIS — M542 Cervicalgia: Secondary | ICD-10-CM | POA: Diagnosis not present

## 2013-06-25 DIAGNOSIS — M546 Pain in thoracic spine: Secondary | ICD-10-CM | POA: Diagnosis not present

## 2013-06-26 DIAGNOSIS — M546 Pain in thoracic spine: Secondary | ICD-10-CM | POA: Diagnosis not present

## 2013-06-26 DIAGNOSIS — M999 Biomechanical lesion, unspecified: Secondary | ICD-10-CM | POA: Diagnosis not present

## 2013-06-26 DIAGNOSIS — M545 Low back pain, unspecified: Secondary | ICD-10-CM | POA: Diagnosis not present

## 2013-06-26 DIAGNOSIS — M542 Cervicalgia: Secondary | ICD-10-CM | POA: Diagnosis not present

## 2013-06-26 DIAGNOSIS — M9981 Other biomechanical lesions of cervical region: Secondary | ICD-10-CM | POA: Diagnosis not present

## 2013-07-01 DIAGNOSIS — M542 Cervicalgia: Secondary | ICD-10-CM | POA: Diagnosis not present

## 2013-07-01 DIAGNOSIS — M9981 Other biomechanical lesions of cervical region: Secondary | ICD-10-CM | POA: Diagnosis not present

## 2013-07-01 DIAGNOSIS — M546 Pain in thoracic spine: Secondary | ICD-10-CM | POA: Diagnosis not present

## 2013-07-01 DIAGNOSIS — M999 Biomechanical lesion, unspecified: Secondary | ICD-10-CM | POA: Diagnosis not present

## 2013-07-01 DIAGNOSIS — M545 Low back pain, unspecified: Secondary | ICD-10-CM | POA: Diagnosis not present

## 2013-07-03 DIAGNOSIS — M545 Low back pain, unspecified: Secondary | ICD-10-CM | POA: Diagnosis not present

## 2013-07-03 DIAGNOSIS — M546 Pain in thoracic spine: Secondary | ICD-10-CM | POA: Diagnosis not present

## 2013-07-03 DIAGNOSIS — M542 Cervicalgia: Secondary | ICD-10-CM | POA: Diagnosis not present

## 2013-07-03 DIAGNOSIS — M999 Biomechanical lesion, unspecified: Secondary | ICD-10-CM | POA: Diagnosis not present

## 2013-07-03 DIAGNOSIS — M9981 Other biomechanical lesions of cervical region: Secondary | ICD-10-CM | POA: Diagnosis not present

## 2013-07-05 DIAGNOSIS — M545 Low back pain, unspecified: Secondary | ICD-10-CM | POA: Diagnosis not present

## 2013-07-05 DIAGNOSIS — M542 Cervicalgia: Secondary | ICD-10-CM | POA: Diagnosis not present

## 2013-07-05 DIAGNOSIS — M9981 Other biomechanical lesions of cervical region: Secondary | ICD-10-CM | POA: Diagnosis not present

## 2013-07-05 DIAGNOSIS — M999 Biomechanical lesion, unspecified: Secondary | ICD-10-CM | POA: Diagnosis not present

## 2013-07-05 DIAGNOSIS — M546 Pain in thoracic spine: Secondary | ICD-10-CM | POA: Diagnosis not present

## 2013-08-20 DIAGNOSIS — L851 Acquired keratosis [keratoderma] palmaris et plantaris: Secondary | ICD-10-CM | POA: Diagnosis not present

## 2013-08-20 DIAGNOSIS — B351 Tinea unguium: Secondary | ICD-10-CM | POA: Diagnosis not present

## 2013-08-20 DIAGNOSIS — E119 Type 2 diabetes mellitus without complications: Secondary | ICD-10-CM | POA: Diagnosis not present

## 2013-09-17 DIAGNOSIS — Z23 Encounter for immunization: Secondary | ICD-10-CM | POA: Diagnosis not present

## 2013-10-15 ENCOUNTER — Ambulatory Visit: Payer: Self-pay | Admitting: Family Medicine

## 2013-10-15 DIAGNOSIS — E785 Hyperlipidemia, unspecified: Secondary | ICD-10-CM | POA: Diagnosis not present

## 2013-10-15 DIAGNOSIS — E669 Obesity, unspecified: Secondary | ICD-10-CM | POA: Diagnosis not present

## 2013-10-15 DIAGNOSIS — I10 Essential (primary) hypertension: Secondary | ICD-10-CM | POA: Diagnosis not present

## 2013-10-15 DIAGNOSIS — E119 Type 2 diabetes mellitus without complications: Secondary | ICD-10-CM | POA: Diagnosis not present

## 2013-10-15 DIAGNOSIS — Z713 Dietary counseling and surveillance: Secondary | ICD-10-CM | POA: Diagnosis not present

## 2013-10-22 DIAGNOSIS — L851 Acquired keratosis [keratoderma] palmaris et plantaris: Secondary | ICD-10-CM | POA: Diagnosis not present

## 2013-10-22 DIAGNOSIS — B351 Tinea unguium: Secondary | ICD-10-CM | POA: Diagnosis not present

## 2013-10-22 DIAGNOSIS — E119 Type 2 diabetes mellitus without complications: Secondary | ICD-10-CM | POA: Diagnosis not present

## 2013-10-23 DIAGNOSIS — Z0000 Encounter for general adult medical examination without abnormal findings: Secondary | ICD-10-CM | POA: Diagnosis not present

## 2013-10-23 DIAGNOSIS — E780 Pure hypercholesterolemia: Secondary | ICD-10-CM | POA: Diagnosis not present

## 2013-10-23 DIAGNOSIS — M1990 Unspecified osteoarthritis, unspecified site: Secondary | ICD-10-CM | POA: Diagnosis not present

## 2013-10-23 DIAGNOSIS — E119 Type 2 diabetes mellitus without complications: Secondary | ICD-10-CM | POA: Diagnosis not present

## 2013-10-23 DIAGNOSIS — I10 Essential (primary) hypertension: Secondary | ICD-10-CM | POA: Diagnosis not present

## 2013-10-23 DIAGNOSIS — L299 Pruritus, unspecified: Secondary | ICD-10-CM | POA: Diagnosis not present

## 2013-11-06 ENCOUNTER — Ambulatory Visit: Payer: Self-pay | Admitting: Family Medicine

## 2013-11-06 DIAGNOSIS — M85852 Other specified disorders of bone density and structure, left thigh: Secondary | ICD-10-CM | POA: Diagnosis not present

## 2013-11-06 DIAGNOSIS — M810 Age-related osteoporosis without current pathological fracture: Secondary | ICD-10-CM | POA: Diagnosis not present

## 2013-11-07 ENCOUNTER — Ambulatory Visit: Payer: Self-pay | Admitting: Family Medicine

## 2014-01-09 DIAGNOSIS — B351 Tinea unguium: Secondary | ICD-10-CM | POA: Diagnosis not present

## 2014-01-09 DIAGNOSIS — L851 Acquired keratosis [keratoderma] palmaris et plantaris: Secondary | ICD-10-CM | POA: Diagnosis not present

## 2014-01-09 DIAGNOSIS — E119 Type 2 diabetes mellitus without complications: Secondary | ICD-10-CM | POA: Diagnosis not present

## 2014-01-22 DIAGNOSIS — E780 Pure hypercholesterolemia: Secondary | ICD-10-CM | POA: Diagnosis not present

## 2014-01-22 DIAGNOSIS — E119 Type 2 diabetes mellitus without complications: Secondary | ICD-10-CM | POA: Diagnosis not present

## 2014-01-22 DIAGNOSIS — I10 Essential (primary) hypertension: Secondary | ICD-10-CM | POA: Diagnosis not present

## 2014-01-28 DIAGNOSIS — E780 Pure hypercholesterolemia: Secondary | ICD-10-CM | POA: Diagnosis not present

## 2014-02-06 DIAGNOSIS — E11359 Type 2 diabetes mellitus with proliferative diabetic retinopathy without macular edema: Secondary | ICD-10-CM | POA: Diagnosis not present

## 2014-04-10 DIAGNOSIS — B351 Tinea unguium: Secondary | ICD-10-CM | POA: Diagnosis not present

## 2014-04-10 DIAGNOSIS — E119 Type 2 diabetes mellitus without complications: Secondary | ICD-10-CM | POA: Diagnosis not present

## 2014-04-10 DIAGNOSIS — L851 Acquired keratosis [keratoderma] palmaris et plantaris: Secondary | ICD-10-CM | POA: Diagnosis not present

## 2014-05-27 DIAGNOSIS — E119 Type 2 diabetes mellitus without complications: Secondary | ICD-10-CM | POA: Diagnosis not present

## 2014-05-27 DIAGNOSIS — M179 Osteoarthritis of knee, unspecified: Secondary | ICD-10-CM | POA: Diagnosis not present

## 2014-05-27 DIAGNOSIS — I10 Essential (primary) hypertension: Secondary | ICD-10-CM | POA: Diagnosis not present

## 2014-05-27 DIAGNOSIS — E780 Pure hypercholesterolemia: Secondary | ICD-10-CM | POA: Diagnosis not present

## 2014-06-12 DIAGNOSIS — E119 Type 2 diabetes mellitus without complications: Secondary | ICD-10-CM | POA: Diagnosis not present

## 2014-06-12 DIAGNOSIS — B351 Tinea unguium: Secondary | ICD-10-CM | POA: Diagnosis not present

## 2014-06-12 DIAGNOSIS — L851 Acquired keratosis [keratoderma] palmaris et plantaris: Secondary | ICD-10-CM | POA: Diagnosis not present

## 2014-06-15 ENCOUNTER — Other Ambulatory Visit: Payer: Self-pay | Admitting: Family Medicine

## 2014-06-28 ENCOUNTER — Other Ambulatory Visit: Payer: Self-pay | Admitting: Family Medicine

## 2014-07-02 ENCOUNTER — Other Ambulatory Visit: Payer: Self-pay | Admitting: Family Medicine

## 2014-07-15 ENCOUNTER — Other Ambulatory Visit: Payer: Self-pay

## 2014-07-15 MED ORDER — METFORMIN HCL 1000 MG PO TABS: 1000.0000 mg | ORAL_TABLET | Freq: Two times a day (BID) | ORAL | Status: DC

## 2014-07-18 ENCOUNTER — Other Ambulatory Visit: Payer: Self-pay | Admitting: Family Medicine

## 2014-07-22 ENCOUNTER — Other Ambulatory Visit: Payer: Self-pay | Admitting: Family Medicine

## 2014-07-24 ENCOUNTER — Other Ambulatory Visit: Payer: Self-pay

## 2014-07-24 MED ORDER — LOSARTAN POTASSIUM-HCTZ 100-12.5 MG PO TABS: 1.0000 | ORAL_TABLET | Freq: Every day | ORAL | Status: DC

## 2014-08-14 DIAGNOSIS — L851 Acquired keratosis [keratoderma] palmaris et plantaris: Secondary | ICD-10-CM | POA: Diagnosis not present

## 2014-08-14 DIAGNOSIS — E119 Type 2 diabetes mellitus without complications: Secondary | ICD-10-CM | POA: Diagnosis not present

## 2014-08-14 DIAGNOSIS — B351 Tinea unguium: Secondary | ICD-10-CM | POA: Diagnosis not present

## 2014-10-30 ENCOUNTER — Other Ambulatory Visit: Payer: Self-pay | Admitting: Family Medicine

## 2014-11-06 DIAGNOSIS — E113493 Type 2 diabetes mellitus with severe nonproliferative diabetic retinopathy without macular edema, bilateral: Secondary | ICD-10-CM | POA: Diagnosis not present

## 2014-11-13 DIAGNOSIS — E119 Type 2 diabetes mellitus without complications: Secondary | ICD-10-CM | POA: Diagnosis not present

## 2014-11-13 DIAGNOSIS — B351 Tinea unguium: Secondary | ICD-10-CM | POA: Diagnosis not present

## 2014-11-13 DIAGNOSIS — L851 Acquired keratosis [keratoderma] palmaris et plantaris: Secondary | ICD-10-CM | POA: Diagnosis not present

## 2014-12-20 ENCOUNTER — Other Ambulatory Visit: Payer: Self-pay | Admitting: Family Medicine

## 2014-12-23 ENCOUNTER — Other Ambulatory Visit: Payer: Self-pay | Admitting: Family Medicine

## 2015-01-16 ENCOUNTER — Other Ambulatory Visit: Payer: Self-pay | Admitting: Family Medicine

## 2015-01-19 ENCOUNTER — Other Ambulatory Visit: Payer: Self-pay | Admitting: Family Medicine

## 2015-01-19 DIAGNOSIS — L851 Acquired keratosis [keratoderma] palmaris et plantaris: Secondary | ICD-10-CM | POA: Diagnosis not present

## 2015-01-19 DIAGNOSIS — B351 Tinea unguium: Secondary | ICD-10-CM | POA: Diagnosis not present

## 2015-01-19 DIAGNOSIS — E119 Type 2 diabetes mellitus without complications: Secondary | ICD-10-CM | POA: Diagnosis not present

## 2015-02-03 ENCOUNTER — Other Ambulatory Visit: Payer: Self-pay | Admitting: Family Medicine

## 2015-03-01 ENCOUNTER — Other Ambulatory Visit: Payer: Self-pay | Admitting: Family Medicine

## 2015-03-02 ENCOUNTER — Other Ambulatory Visit: Payer: Self-pay | Admitting: Family Medicine

## 2015-03-09 ENCOUNTER — Telehealth: Payer: Self-pay | Admitting: Family Medicine

## 2015-03-09 NOTE — Telephone Encounter (Signed)
Patient scheduled appointment for 05-12-15 and is requesting refills on Losartan 100-.12.5mg , Repaglinide 2mg , Montelukast 10mg . Please send to [facility]

## 2015-03-10 ENCOUNTER — Other Ambulatory Visit: Payer: Self-pay | Admitting: Family Medicine

## 2015-03-13 ENCOUNTER — Encounter: Payer: Self-pay | Admitting: *Deleted

## 2015-03-26 DIAGNOSIS — B351 Tinea unguium: Secondary | ICD-10-CM | POA: Diagnosis not present

## 2015-03-26 DIAGNOSIS — E1149 Type 2 diabetes mellitus with other diabetic neurological complication: Secondary | ICD-10-CM | POA: Diagnosis not present

## 2015-03-26 DIAGNOSIS — L851 Acquired keratosis [keratoderma] palmaris et plantaris: Secondary | ICD-10-CM | POA: Diagnosis not present

## 2015-04-16 ENCOUNTER — Other Ambulatory Visit: Payer: Self-pay | Admitting: Family Medicine

## 2015-04-20 ENCOUNTER — Other Ambulatory Visit: Payer: Self-pay | Admitting: Family Medicine

## 2015-04-30 ENCOUNTER — Other Ambulatory Visit: Payer: Self-pay | Admitting: Family Medicine

## 2015-05-07 DIAGNOSIS — H40003 Preglaucoma, unspecified, bilateral: Secondary | ICD-10-CM | POA: Diagnosis not present

## 2015-05-12 ENCOUNTER — Ambulatory Visit: Payer: Self-pay | Admitting: Family Medicine

## 2015-05-15 DIAGNOSIS — E113553 Type 2 diabetes mellitus with stable proliferative diabetic retinopathy, bilateral: Secondary | ICD-10-CM | POA: Diagnosis not present

## 2015-06-02 ENCOUNTER — Ambulatory Visit (INDEPENDENT_AMBULATORY_CARE_PROVIDER_SITE_OTHER): Payer: Medicare Other | Admitting: Family Medicine

## 2015-06-02 ENCOUNTER — Encounter: Payer: Self-pay | Admitting: Family Medicine

## 2015-06-02 VITALS — BP 128/66 | HR 100 | Temp 98.4°F | Resp 18 | Ht 65.0 in | Wt 225.0 lb

## 2015-06-02 DIAGNOSIS — J309 Allergic rhinitis, unspecified: Secondary | ICD-10-CM | POA: Diagnosis not present

## 2015-06-02 DIAGNOSIS — M9979 Connective tissue and disc stenosis of intervertebral foramina of abdomen and other regions: Secondary | ICD-10-CM | POA: Insufficient documentation

## 2015-06-02 DIAGNOSIS — I87303 Chronic venous hypertension (idiopathic) without complications of bilateral lower extremity: Secondary | ICD-10-CM | POA: Diagnosis not present

## 2015-06-02 DIAGNOSIS — B351 Tinea unguium: Secondary | ICD-10-CM | POA: Diagnosis not present

## 2015-06-02 DIAGNOSIS — E7800 Pure hypercholesterolemia, unspecified: Secondary | ICD-10-CM

## 2015-06-02 DIAGNOSIS — E1169 Type 2 diabetes mellitus with other specified complication: Secondary | ICD-10-CM | POA: Insufficient documentation

## 2015-06-02 DIAGNOSIS — I10 Essential (primary) hypertension: Secondary | ICD-10-CM | POA: Diagnosis not present

## 2015-06-02 DIAGNOSIS — E785 Hyperlipidemia, unspecified: Secondary | ICD-10-CM

## 2015-06-02 DIAGNOSIS — L851 Acquired keratosis [keratoderma] palmaris et plantaris: Secondary | ICD-10-CM | POA: Diagnosis not present

## 2015-06-02 DIAGNOSIS — E1165 Type 2 diabetes mellitus with hyperglycemia: Secondary | ICD-10-CM

## 2015-06-02 DIAGNOSIS — J3089 Other allergic rhinitis: Secondary | ICD-10-CM

## 2015-06-02 DIAGNOSIS — M8580 Other specified disorders of bone density and structure, unspecified site: Secondary | ICD-10-CM | POA: Diagnosis not present

## 2015-06-02 DIAGNOSIS — E119 Type 2 diabetes mellitus without complications: Secondary | ICD-10-CM | POA: Diagnosis not present

## 2015-06-02 DIAGNOSIS — E669 Obesity, unspecified: Secondary | ICD-10-CM | POA: Insufficient documentation

## 2015-06-02 DIAGNOSIS — M1990 Unspecified osteoarthritis, unspecified site: Secondary | ICD-10-CM | POA: Insufficient documentation

## 2015-06-02 LAB — POCT UA - MICROALBUMIN: Microalbumin Ur, POC: 20 mg/L

## 2015-06-02 LAB — POCT GLYCOSYLATED HEMOGLOBIN (HGB A1C): HEMOGLOBIN A1C: 7.6

## 2015-06-02 MED ORDER — POTASSIUM CHLORIDE ER 20 MEQ PO TBCR: 20.0000 meq | EXTENDED_RELEASE_TABLET | Freq: Every day | ORAL | Status: DC | PRN

## 2015-06-02 MED ORDER — FUROSEMIDE 40 MG PO TABS: 40.0000 mg | ORAL_TABLET | Freq: Every day | ORAL | Status: DC | PRN

## 2015-06-02 MED ORDER — METFORMIN HCL ER 750 MG PO TB24: 750.0000 mg | ORAL_TABLET | Freq: Every day | ORAL | Status: DC

## 2015-06-02 MED ORDER — MONTELUKAST SODIUM 10 MG PO TABS: 10.0000 mg | ORAL_TABLET | Freq: Every evening | ORAL | Status: DC

## 2015-06-02 MED ORDER — LOSARTAN POTASSIUM-HCTZ 100-12.5 MG PO TABS: 1.0000 | ORAL_TABLET | Freq: Every day | ORAL | Status: DC

## 2015-06-02 MED ORDER — ATORVASTATIN CALCIUM 40 MG PO TABS: 40.0000 mg | ORAL_TABLET | Freq: Every day | ORAL | Status: DC

## 2015-06-02 NOTE — Progress Notes (Signed)
Name: Beverly [name]   MRN: [id number]    DOB: [birth year]   Date:06/02/2015       Progress Note  Subjective  Chief Complaint  Chief Complaint  Patient presents with  . Medication Refill    6 month F/U  . Hypertension    Bilateral edema in feet checks BP at home and gets around 116/60's  . Diabetes    checks every 4 days, Averages-95-105 Fasting and Postprandial 150  . Allergic Rhinitis     Well controlled  . Hyperlipidemia    HPI  DMII: she has been taking only one pill of Metformin 1000 mg daily and also Prandin before meals. She states glucose has been well controlled, usually 100's fasting, at mostly around 150-160's post-prandially. Eye exam is up to date, we will obtain copy from [facility]. She denies polyphagia or polydipsia but she has nocturia. No side effects of medication, no recent hypoglycemic episode. No blurred vision. HgbA1C was 7.6% and is at goal for her, we will change to Metformin ER 750 mg to get a 24 hour coverage, discussed hypoglycemic episodes. Urine micro negative  HTN: well controlled, no chest pain, no palpitation. Taking medication as prescribed, no side effects  Hyperlipidemia: taking pravachol, but because of DM, we will change to Lipitor, no myalgias. Due for labs  AR: well controlled, taking singulair and also an OTC allergy medication but she is not sure of the name. She denies rhinorrhea or nasal congestion    Patient Active Problem List   Diagnosis Date Noted  . Morbid obesity ([place]) 06/02/2015  . Osteoarthritis 06/02/2015  . Osteopenia 06/02/2015  . Narrowing of intervertebral disc space 06/02/2015  . Type 2 diabetes mellitus with hyperglycemia, without long-term current use of insulin ([place]) 06/02/2015  . Perennial allergic rhinitis 02/28/2007  . Benign essential HTN 06/26/2006  . Pure hypercholesterolemia 06/26/2006    Past Surgical History  Procedure Laterality Date  . Abdominal hysterectomy      Family History  Problem Relation  Age of Onset  . Hypertension Mother   . Raynaud syndrome Daughter   . Hypertension Daughter     Social History   Social History  . Marital Status: Married    Spouse Name: N/A  . Number of Children: N/A  . Years of Education: N/A   Occupational History  . Not on file.   Social History Main Topics  . Smoking status: Never Smoker   . Smokeless tobacco: Never Used  . Alcohol Use: No  . Drug Use: No  . Sexual Activity: Not Currently   Other Topics Concern  . Not on file   Social History Narrative  . No narrative on file     Current outpatient prescriptions:  .  alendronate (FOSAMAX) 35 MG tablet, TAKE 1 TABLET BY MOUTH WEEKLY, Disp: 12 tablet, Rfl: 2 .  aspirin 81 MG tablet, Take by mouth., Disp: , Rfl:  .  furosemide (LASIX) 40 MG tablet, Take 1 tablet (40 mg total) by mouth daily as needed., Disp: 90 tablet, Rfl: 0 .  Glucosamine Sulfate 500 MG TABS, Take by mouth., Disp: , Rfl:  .  losartan-hydrochlorothiazide (HYZAAR) 100-12.5 MG tablet, Take 1 tablet by mouth daily., Disp: 90 tablet, Rfl: 1 .  montelukast (SINGULAIR) 10 MG tablet, Take 1 tablet (10 mg total) by mouth every evening., Disp: 90 tablet, Rfl: 1 .  potassium chloride 20 MEQ TBCR, Take 20 mEq by mouth daily as needed. With lasix, Disp: 90 tablet, Rfl: 0 .  repaglinide (PRANDIN) 2 MG tablet, TAKE 1 TABLET BY MOUTH 3 TIMES A DAY, Disp: 90 tablet, Rfl: 6 .  Vitamin D, Ergocalciferol, (DRISDOL) 50000 units CAPS capsule, TAKE ONE CAPSULE BY MOUTH WEEKLY FOR 8 WEEKS THEN 1 CAPSULE EVERY MONTH, Disp: 12 capsule, Rfl: 0 .  atorvastatin (LIPITOR) 40 MG tablet, Take 1 tablet (40 mg total) by mouth daily. In place of Pravastatin 80mg , Disp: 90 tablet, Rfl: 0 .  metFORMIN (GLUCOPHAGE-XR) 750 MG 24 hr tablet, Take 1 tablet (750 mg total) by mouth daily with breakfast., Disp: 90 tablet, Rfl: 1  Allergies  Allergen Reactions  . Lisinopril      ROS  Constitutional: Negative for fever or weight change.  Respiratory:  Negative for cough and shortness of breath.   Cardiovascular: Negative for chest pain or palpitations.  Gastrointestinal: Negative for abdominal pain, no bowel changes.  Musculoskeletal: Negative for gait problem or joint swelling.  Skin: Negative for rash.  Neurological: Negative for dizziness or headache.  No other specific complaints in a complete review of systems (except as listed in HPI above).  Objective  Filed Vitals:   06/02/15 1347  BP: 128/66  Pulse: 100  Temp: 98.4 F (36.9 C)  TempSrc: Oral  Resp: 18  Height: 5\' 5"  (1.651 m)  Weight: 225 lb (102.059 kg)  SpO2: 96%    Body mass index is 37.44 kg/(m^2).  Physical Exam  Constitutional: Patient appears well-developed and well-nourished. Obese  No distress.  HEENT: head atraumatic, normocephalic, pupils equal and reactive to light,neck supple, throat within normal limits Cardiovascular: Normal rate, regular rhythm and normal heart sounds.  No murmur heard. Trace  BLE edema. Pulmonary/Chest: Effort normal and breath sounds normal. No respiratory distress. Abdominal: Soft.  There is no tenderness. Psychiatric: Patient has a normal mood and affect. behavior is normal. Judgment and thought content normal.  Recent Results (from the past 2160 hour(s))  POCT HgB A1C     Status: Abnormal   Collection Time: 06/02/15  1:50 PM  Result Value Ref Range   Hemoglobin A1C 7.6   POCT UA - Microalbumin     Status: None   Collection Time: 06/02/15  1:52 PM  Result Value Ref Range   Microalbumin Ur, POC 20 mg/L   Creatinine, POC  mg/dL   Albumin/Creatinine Ratio, Urine, POC      Diabetic Foot Exam: Diabetic Foot Exam - Simple   Simple Foot Form  Diabetic Foot exam was performed with the following findings:  Yes 06/02/2015  2:09 PM  Visual Inspection  See comments:  Yes  Sensation Testing  Intact to touch and monofilament testing bilaterally:  Yes  Pulse Check  Posterior Tibialis and Dorsalis pulse intact bilaterally:  Yes   Comments  Seen by Dr. Jens Som today, dry skin, thick nails and some callus formation on plantar aspect       PHQ2/9: Depression screen PHQ 2/9 06/02/2015  Decreased Interest 0  Down, Depressed, Hopeless 0  PHQ - 2 Score 0     Fall Risk: Fall Risk  06/02/2015  Falls in the past year? No     Functional Status Survey: Is the patient deaf or have difficulty hearing?: No Does the patient have difficulty seeing, even when wearing glasses/contacts?: No Does the patient have difficulty concentrating, remembering, or making decisions?: No Does the patient have difficulty walking or climbing stairs?: No Does the patient have difficulty dressing or bathing?: No Does the patient have difficulty doing errands alone such as visiting a doctor's  office or shopping?: No    Assessment & Plan  1. Type 2 diabetes mellitus with hyperglycemia, without long-term current use of insulin (HCC)  - POCT HgB A1C 7.6% - POCT UA - Microalbumin normal  - metFORMIN (GLUCOPHAGE-XR) 750 MG 24 hr tablet; Take 1 tablet (750 mg total) by mouth daily with breakfast.  Dispense: 90 tablet; Refill: 1  2. Pure hypercholesterolemia  - atorvastatin (LIPITOR) 40 MG tablet; Take 1 tablet (40 mg total) by mouth daily. In place of Pravastatin 80mg   Dispense: 90 tablet; Refill: 0 - Lipid panel  3. Morbid obesity, unspecified obesity type [facility])  Discussed life style modification Discussed with the patient the risk posed by an increased BMI. Discussed importance of portion control, calorie counting and at least 150 minutes of physical activity weekly. Avoid sweet beverages and drink more water. Eat at least 6 servings of fruit and vegetables daily   4. Perennial allergic rhinitis  - montelukast (SINGULAIR) 10 MG tablet; Take 1 tablet (10 mg total) by mouth every evening.  Dispense: 90 tablet; Refill: 1  5. Benign essential HTN  - losartan-hydrochlorothiazide (HYZAAR) 100-12.5 MG tablet; Take 1 tablet by mouth daily.   Dispense: 90 tablet; Refill: 1 - CBC with Differential/Platelet - Comprehensive metabolic panel  6. Stasis edema of both lower extremities  - potassium chloride 20 MEQ TBCR; Take 20 mEq by mouth daily as needed. With lasix  Dispense: 90 tablet; Refill: 0 - furosemide (LASIX) 40 MG tablet; Take 1 tablet (40 mg total) by mouth daily as needed.  Dispense: 90 tablet; Refill: 0  7. Osteopenia  - VITAMIN D 25 Hydroxy (Vit-D Deficiency, Fractures)

## 2015-06-05 DIAGNOSIS — E7800 Pure hypercholesterolemia, unspecified: Secondary | ICD-10-CM | POA: Diagnosis not present

## 2015-06-05 DIAGNOSIS — M8580 Other specified disorders of bone density and structure, unspecified site: Secondary | ICD-10-CM | POA: Diagnosis not present

## 2015-06-05 DIAGNOSIS — I10 Essential (primary) hypertension: Secondary | ICD-10-CM | POA: Diagnosis not present

## 2015-06-06 LAB — CBC WITH DIFFERENTIAL/PLATELET
BASOS ABS: 0 10*3/uL (ref 0.0–0.2)
Basos: 0 %
EOS (ABSOLUTE): 0.1 10*3/uL (ref 0.0–0.4)
Eos: 1 %
Hematocrit: 35.4 % (ref 34.0–46.6)
Hemoglobin: 11.5 g/dL (ref 11.1–15.9)
IMMATURE GRANS (ABS): 0 10*3/uL (ref 0.0–0.1)
Immature Granulocytes: 0 %
LYMPHS: 65 %
Lymphocytes Absolute: 4.1 10*3/uL — ABNORMAL HIGH (ref 0.7–3.1)
MCH: 29.9 pg (ref 26.6–33.0)
MCHC: 32.5 g/dL (ref 31.5–35.7)
MCV: 92 fL (ref 79–97)
Monocytes Absolute: 0.4 10*3/uL (ref 0.1–0.9)
Monocytes: 5 %
NEUTROS ABS: 1.9 10*3/uL (ref 1.4–7.0)
NEUTROS PCT: 29 %
PLATELETS: 226 10*3/uL (ref 150–379)
RBC: 3.84 x10E6/uL (ref 3.77–5.28)
RDW: 15.5 % — ABNORMAL HIGH (ref 12.3–15.4)
WBC: 6.5 10*3/uL (ref 3.4–10.8)

## 2015-06-06 LAB — COMPREHENSIVE METABOLIC PANEL
ALK PHOS: 69 IU/L (ref 39–117)
ALT: 7 IU/L (ref 0–32)
AST: 13 IU/L (ref 0–40)
Albumin/Globulin Ratio: 1.3 (ref 1.2–2.2)
Albumin: 3.8 g/dL (ref 3.5–4.8)
BILIRUBIN TOTAL: 0.5 mg/dL (ref 0.0–1.2)
BUN/Creatinine Ratio: 14 (ref 12–28)
BUN: 13 mg/dL (ref 8–27)
CHLORIDE: 100 mmol/L (ref 96–106)
CO2: 25 mmol/L (ref 18–29)
Calcium: 9.2 mg/dL (ref 8.7–10.3)
Creatinine, Ser: 0.93 mg/dL (ref 0.57–1.00)
GFR calc non Af Amer: 59 mL/min/{1.73_m2} — ABNORMAL LOW (ref >59)
GFR, EST AFRICAN AMERICAN: 68 mL/min/{1.73_m2} (ref >59)
GLUCOSE: 115 mg/dL — AB (ref 65–99)
Globulin, Total: 2.9 g/dL (ref 1.5–4.5)
Potassium: 4 mmol/L (ref 3.5–5.2)
Sodium: 141 mmol/L (ref 134–144)
TOTAL PROTEIN: 6.7 g/dL (ref 6.0–8.5)

## 2015-06-06 LAB — LIPID PANEL
CHOL/HDL RATIO: 2.6 ratio (ref 0.0–4.4)
Cholesterol, Total: 135 mg/dL (ref 100–199)
HDL: 51 mg/dL (ref >39)
LDL CALC: 69 mg/dL (ref 0–99)
TRIGLYCERIDES: 77 mg/dL (ref 0–149)
VLDL Cholesterol Cal: 15 mg/dL (ref 5–40)

## 2015-06-06 LAB — VITAMIN D 25 HYDROXY (VIT D DEFICIENCY, FRACTURES): VIT D 25 HYDROXY: 44.7 ng/mL (ref 30.0–100.0)

## 2015-07-22 ENCOUNTER — Other Ambulatory Visit: Payer: Self-pay | Admitting: Family Medicine

## 2015-08-04 DIAGNOSIS — E119 Type 2 diabetes mellitus without complications: Secondary | ICD-10-CM | POA: Diagnosis not present

## 2015-08-04 DIAGNOSIS — L851 Acquired keratosis [keratoderma] palmaris et plantaris: Secondary | ICD-10-CM | POA: Diagnosis not present

## 2015-08-04 DIAGNOSIS — B351 Tinea unguium: Secondary | ICD-10-CM | POA: Diagnosis not present

## 2015-08-23 ENCOUNTER — Other Ambulatory Visit: Payer: Self-pay | Admitting: Family Medicine

## 2015-10-02 ENCOUNTER — Other Ambulatory Visit: Payer: Self-pay | Admitting: Family Medicine

## 2015-10-02 ENCOUNTER — Telehealth: Payer: Self-pay | Admitting: Family Medicine

## 2015-10-02 MED ORDER — ALENDRONATE SODIUM 35 MG PO TABS: 35.0000 mg | ORAL_TABLET | ORAL | 2 refills | Status: DC

## 2015-10-02 NOTE — Telephone Encounter (Signed)
Pt needs refill on Alendronate sodium 35mg  tabs to be sent to [facility].

## 2015-10-06 ENCOUNTER — Ambulatory Visit (INDEPENDENT_AMBULATORY_CARE_PROVIDER_SITE_OTHER): Payer: Medicare Other | Admitting: Family Medicine

## 2015-10-06 ENCOUNTER — Encounter: Payer: Self-pay | Admitting: Family Medicine

## 2015-10-06 VITALS — BP 124/68 | HR 96 | Temp 98.0°F | Resp 16 | Ht 65.0 in | Wt 228.6 lb

## 2015-10-06 DIAGNOSIS — E1169 Type 2 diabetes mellitus with other specified complication: Secondary | ICD-10-CM

## 2015-10-06 DIAGNOSIS — J3089 Other allergic rhinitis: Secondary | ICD-10-CM

## 2015-10-06 DIAGNOSIS — E785 Hyperlipidemia, unspecified: Secondary | ICD-10-CM

## 2015-10-06 DIAGNOSIS — I10 Essential (primary) hypertension: Secondary | ICD-10-CM | POA: Diagnosis not present

## 2015-10-06 DIAGNOSIS — Z23 Encounter for immunization: Secondary | ICD-10-CM | POA: Diagnosis not present

## 2015-10-06 DIAGNOSIS — I87303 Chronic venous hypertension (idiopathic) without complications of bilateral lower extremity: Secondary | ICD-10-CM | POA: Diagnosis not present

## 2015-10-06 LAB — POCT GLYCOSYLATED HEMOGLOBIN (HGB A1C): HEMOGLOBIN A1C: 8.5

## 2015-10-06 MED ORDER — LORATADINE 10 MG PO TABS: 10.0000 mg | ORAL_TABLET | Freq: Every day | ORAL | 1 refills | Status: AC

## 2015-10-06 MED ORDER — LOSARTAN POTASSIUM-HCTZ 100-12.5 MG PO TABS: 1.0000 | ORAL_TABLET | Freq: Every day | ORAL | 1 refills | Status: DC

## 2015-10-06 MED ORDER — METFORMIN HCL ER 750 MG PO TB24: 750.0000 mg | ORAL_TABLET | Freq: Every day | ORAL | 1 refills | Status: DC

## 2015-10-06 MED ORDER — ATORVASTATIN CALCIUM 40 MG PO TABS: 40.0000 mg | ORAL_TABLET | Freq: Every day | ORAL | 1 refills | Status: DC

## 2015-10-06 MED ORDER — POTASSIUM CHLORIDE ER 20 MEQ PO TBCR: 10.0000 meq | EXTENDED_RELEASE_TABLET | Freq: Every day | ORAL | Status: DC | PRN

## 2015-10-06 MED ORDER — FUROSEMIDE 40 MG PO TABS: 40.0000 mg | ORAL_TABLET | Freq: Every day | ORAL | 0 refills | Status: DC | PRN

## 2015-10-06 MED ORDER — MONTELUKAST SODIUM 10 MG PO TABS: 10.0000 mg | ORAL_TABLET | Freq: Every evening | ORAL | 1 refills | Status: DC

## 2015-10-06 MED ORDER — REPAGLINIDE 2 MG PO TABS: 2.0000 mg | ORAL_TABLET | Freq: Three times a day (TID) | ORAL | 1 refills | Status: DC

## 2015-10-06 NOTE — Progress Notes (Signed)
Name: Beverly [name]   MRN: [id number]    DOB: 17-Jul-1934   Date:10/06/2015       Progress Note  Subjective  Chief Complaint  Chief Complaint  Patient presents with  . Diabetes    low 95 avg 95-105 high 160  . Hyperlipidemia  . Hypertension    pt here for 4 month follow up    HPI  DMII: she has been taking only one pill of Metformin 750 XR  daily and also Prandin before meals, she was on metformin 1000 before last visit. HgbA1 C has gone up, but she will try to cut down on carbohydrates. Fasting 95-105, post- prandially 150-160. Eye exam is up to date She denies polyphagia or polydipsia but she has nocturia. No side effects of medication, no recent hypoglycemic episode. No blurred vision. HgbA1C was 7.6% but is now up to 8.5%. Urine micro done last visit was normal   HTN: well controlled, no chest pain, no palpitation. Taking medication as prescribed, no side effects. Takes Furosemide prn only for severe swelling, discussed compression stocking hoses to control swelling - she states symptoms usually present during the Summer  Hyperlipidemia: taking pravachol, but because of DM, we will change to Lipitor, no myalgias.   AR: well controlled, taking singulair and also an Benadryl otc, discussed BEARS list and we will try Loratadine instead. She is doing well at this time,  denies rhinorrhea, sneezing  or nasal congestion   Obesity: she states her weight has been going up lately, she states average is 220 lbs. Discussed life style modification, she has been going to the gym and will add some walking also   OA: she has right knee pain, it started after a fall 3 years ago, she takes arthritis pain pill otc, not sure of the name but will bring it on her next visit. Pain is described as dull and aching, intermittent   Patient Active Problem List   Diagnosis Date Noted  . Morbid obesity ([place]) 06/02/2015  . Osteoarthritis 06/02/2015  . Osteopenia 06/02/2015  . Narrowing of intervertebral  disc space 06/02/2015  . Dyslipidemia associated with type 2 diabetes mellitus ([place]) 06/02/2015  . Perennial allergic rhinitis 02/28/2007  . Benign essential HTN 06/26/2006  . Pure hypercholesterolemia 06/26/2006    Past Surgical History:  Procedure Laterality Date  . ABDOMINAL HYSTERECTOMY      Family History  Problem Relation Age of Onset  . Hypertension Mother   . Raynaud syndrome Daughter   . Hypertension Daughter     Social History   Social History  . Marital status: Married    Spouse name: N/A  . Number of children: N/A  . Years of education: N/A   Occupational History  . Not on file.   Social History Main Topics  . Smoking status: Never Smoker  . Smokeless tobacco: Never Used  . Alcohol use No  . Drug use: No  . Sexual activity: Not Currently   Other Topics Concern  . Not on file   Social History Narrative  . No narrative on file     Current Outpatient Prescriptions:  .  alendronate (FOSAMAX) 35 MG tablet, Take 1 tablet (35 mg total) by mouth once a week. Take with a full glass of water on an empty stomach., Disp: 12 tablet, Rfl: 2 .  aspirin 81 MG tablet, Take by mouth., Disp: , Rfl:  .  atorvastatin (LIPITOR) 40 MG tablet, Take 1 tablet (40 mg total) by mouth daily., Disp:  90 tablet, Rfl: 1 .  furosemide (LASIX) 40 MG tablet, Take 1 tablet (40 mg total) by mouth daily as needed., Disp: 90 tablet, Rfl: 0 .  Glucosamine Sulfate 500 MG TABS, Take by mouth., Disp: , Rfl:  .  loratadine (CLARITIN) 10 MG tablet, Take 1 tablet (10 mg total) by mouth daily., Disp: 90 tablet, Rfl: 1 .  losartan-hydrochlorothiazide (HYZAAR) 100-12.5 MG tablet, Take 1 tablet by mouth daily., Disp: 90 tablet, Rfl: 1 .  metFORMIN (GLUCOPHAGE-XR) 750 MG 24 hr tablet, Take 1 tablet (750 mg total) by mouth daily with breakfast., Disp: 90 tablet, Rfl: 1 .  montelukast (SINGULAIR) 10 MG tablet, Take 1 tablet (10 mg total) by mouth every evening., Disp: 90 tablet, Rfl: 1 .  Potassium  Chloride ER 20 MEQ TBCR, Take 10 mEq by mouth daily as needed. With lasix, Disp: , Rfl:  .  repaglinide (PRANDIN) 2 MG tablet, Take 1 tablet (2 mg total) by mouth 3 (three) times daily., Disp: 270 tablet, Rfl: 1 .  Vitamin D, Ergocalciferol, (DRISDOL) 50000 units CAPS capsule, TAKE ONE CAPSULE BY MOUTH WEEKLY FOR 8 WEEKS THEN 1 CAPSULE EVERY MONTH, Disp: 12 capsule, Rfl: 0  Allergies  Allergen Reactions  . Lisinopril      ROS  Constitutional: Negative for fever or significant weight change.  Respiratory: Negative for cough and shortness of breath.   Cardiovascular: Negative for chest pain or palpitations.  Gastrointestinal: Negative for abdominal pain, no bowel changes.  Musculoskeletal: Negative for gait problem or joint swelling.  Skin: Negative for rash.  Neurological: Negative for dizziness or headache.  No other specific complaints in a complete review of systems (except as listed in HPI above).   Objective  Vitals:   10/06/15 1018  BP: 124/68  Pulse: 96  Resp: 16  Temp: 98 F (36.7 C)  SpO2: 98%  Weight: 228 lb 9 oz (103.7 kg)  Height: 5\' 5"  (1.651 m)    Body mass index is 38.03 kg/m.  Physical Exam  Constitutional: Patient appears well-developed and well-nourished. Obese No distress.  HEENT: head atraumatic, normocephalic, pupils equal and reactive to light, neck supple, throat within normal limits Cardiovascular: Normal rate, regular rhythm and normal heart sounds.  No murmur heard. non-pitting  BLE edema. Pulmonary/Chest: Effort normal and breath sounds normal. No respiratory distress. Abdominal: Soft.  There is no tenderness. Psychiatric: Patient has a normal mood and affect. behavior is normal. Judgment and thought content normal.  Recent Results (from the past 2160 hour(s))  POCT HgB A1C     Status: None   Collection Time: 10/06/15 10:31 AM  Result Value Ref Range   Hemoglobin A1C 8.5      PHQ2/9: Depression screen [facility] 2/9 10/06/2015 06/02/2015   Decreased Interest 0 0  Down, Depressed, Hopeless 0 0  PHQ - 2 Score 0 0     Fall Risk: Fall Risk  10/06/2015 06/02/2015  Falls in the past year? No No     Functional Status Survey: Is the patient deaf or have difficulty hearing?: No Does the patient have difficulty seeing, even when wearing glasses/contacts?: No Does the patient have difficulty concentrating, remembering, or making decisions?: No Does the patient have difficulty walking or climbing stairs?: Yes Does the patient have difficulty dressing or bathing?: No Does the patient have difficulty doing errands alone such as visiting a doctor's office or shopping?: No   Assessment & Plan  1. Dyslipidemia associated with type 2 diabetes mellitus (HCC)  - POCT HgB A1C -  it has gone up, she seems to be eating a lot of starches, discussed a diabetic diet again - and she will try to get it better controlled - metFORMIN (GLUCOPHAGE-XR) 750 MG 24 hr tablet; Take 1 tablet (750 mg total) by mouth daily with breakfast.  Dispense: 90 tablet; Refill: 1 - atorvastatin (LIPITOR) 40 MG tablet; Take 1 tablet (40 mg total) by mouth daily.  Dispense: 90 tablet; Refill: 1 - repaglinide (PRANDIN) 2 MG tablet; Take 1 tablet (2 mg total) by mouth 3 (three) times daily.  Dispense: 270 tablet; Refill: 1  2. Need for influenza vaccination  - Flu vaccine HIGH DOSE PF (Fluzone High dose)  3. Perennial allergic rhinitis  - montelukast (SINGULAIR) 10 MG tablet; Take 1 tablet (10 mg total) by mouth every evening.  Dispense: 90 tablet; Refill: 1 - loratadine (CLARITIN) 10 MG tablet; Take 1 tablet (10 mg total) by mouth daily.  Dispense: 90 tablet; Refill: 1  4. Benign essential HTN  - losartan-hydrochlorothiazide (HYZAAR) 100-12.5 MG tablet; Take 1 tablet by mouth daily.  Dispense: 90 tablet; Refill: 1  5. Stasis edema of both lower extremities  Continue prn medication  - Potassium Chloride ER 20 MEQ TBCR; Take 10 mEq by mouth daily as needed.  With lasix - furosemide (LASIX) 40 MG tablet; Take 1 tablet (40 mg total) by mouth daily as needed.  Dispense: 90 tablet; Refill: 0  6. Morbid obesity ([place])  She has been going to [facility] and exercising 3 times a week for about 40 minutes,    8. Dyslipidemia  - atorvastatin (LIPITOR) 40 MG tablet; Take 1 tablet (40 mg total) by mouth daily.  Dispense: 90 tablet; Refill: 1

## 2015-10-08 DIAGNOSIS — E119 Type 2 diabetes mellitus without complications: Secondary | ICD-10-CM | POA: Diagnosis not present

## 2015-10-08 DIAGNOSIS — B351 Tinea unguium: Secondary | ICD-10-CM | POA: Diagnosis not present

## 2015-10-08 DIAGNOSIS — L851 Acquired keratosis [keratoderma] palmaris et plantaris: Secondary | ICD-10-CM | POA: Diagnosis not present

## 2015-11-03 DIAGNOSIS — H40003 Preglaucoma, unspecified, bilateral: Secondary | ICD-10-CM | POA: Diagnosis not present

## 2015-11-10 DIAGNOSIS — H40003 Preglaucoma, unspecified, bilateral: Secondary | ICD-10-CM | POA: Diagnosis not present

## 2015-11-12 ENCOUNTER — Encounter: Payer: Self-pay | Admitting: Family Medicine

## 2015-11-12 DIAGNOSIS — E11319 Type 2 diabetes mellitus with unspecified diabetic retinopathy without macular edema: Secondary | ICD-10-CM | POA: Insufficient documentation

## 2015-12-17 DIAGNOSIS — B351 Tinea unguium: Secondary | ICD-10-CM | POA: Diagnosis not present

## 2015-12-17 DIAGNOSIS — L851 Acquired keratosis [keratoderma] palmaris et plantaris: Secondary | ICD-10-CM | POA: Diagnosis not present

## 2015-12-17 DIAGNOSIS — E119 Type 2 diabetes mellitus without complications: Secondary | ICD-10-CM | POA: Diagnosis not present

## 2015-12-22 ENCOUNTER — Other Ambulatory Visit: Payer: Self-pay

## 2015-12-22 MED ORDER — VITAMIN D (ERGOCALCIFEROL) 1.25 MG (50000 UNIT) PO CAPS
ORAL_CAPSULE | ORAL | 0 refills | Status: DC
Start: 2015-12-22 — End: 2016-10-14

## 2015-12-22 NOTE — Telephone Encounter (Signed)
Patient requesting refill of Vit D to [facility] to [facility].

## 2016-02-09 ENCOUNTER — Encounter: Payer: Self-pay | Admitting: Family Medicine

## 2016-02-09 ENCOUNTER — Ambulatory Visit (INDEPENDENT_AMBULATORY_CARE_PROVIDER_SITE_OTHER): Payer: BLUE CROSS/BLUE SHIELD | Admitting: Family Medicine

## 2016-02-09 VITALS — BP 118/64 | HR 103 | Temp 98.0°F | Resp 16 | Ht 65.0 in | Wt 220.3 lb

## 2016-02-09 DIAGNOSIS — J3089 Other allergic rhinitis: Secondary | ICD-10-CM | POA: Diagnosis not present

## 2016-02-09 DIAGNOSIS — I10 Essential (primary) hypertension: Secondary | ICD-10-CM

## 2016-02-09 DIAGNOSIS — E1169 Type 2 diabetes mellitus with other specified complication: Secondary | ICD-10-CM | POA: Diagnosis not present

## 2016-02-09 DIAGNOSIS — E785 Hyperlipidemia, unspecified: Secondary | ICD-10-CM

## 2016-02-09 LAB — POCT GLYCOSYLATED HEMOGLOBIN (HGB A1C): Hemoglobin A1C: 6.8

## 2016-02-09 MED ORDER — LOSARTAN POTASSIUM-HCTZ 100-12.5 MG PO TABS: 1.0000 | ORAL_TABLET | Freq: Every day | ORAL | 1 refills | Status: DC

## 2016-02-09 MED ORDER — MONTELUKAST SODIUM 10 MG PO TABS: 10.0000 mg | ORAL_TABLET | Freq: Every evening | ORAL | 1 refills | Status: DC

## 2016-02-09 MED ORDER — ATORVASTATIN CALCIUM 40 MG PO TABS: 40.0000 mg | ORAL_TABLET | Freq: Every day | ORAL | 1 refills | Status: DC

## 2016-02-09 MED ORDER — REPAGLINIDE 2 MG PO TABS: 2.0000 mg | ORAL_TABLET | Freq: Three times a day (TID) | ORAL | 1 refills | Status: DC

## 2016-02-09 MED ORDER — METFORMIN HCL ER 750 MG PO TB24: 750.0000 mg | ORAL_TABLET | Freq: Every day | ORAL | 1 refills | Status: DC

## 2016-02-09 NOTE — Progress Notes (Signed)
Name: Beverly [name]   MRN: [id number]    DOB: Oct 17, 1934   Date:02/09/2016       Progress Note  Subjective  Chief Complaint  Chief Complaint  Patient presents with  . Diabetes  . Hyperlipidemia  . Obesity    HPI  DMII: she has been taking only one pill of Metformin 750 XR  daily and also Prandin before meals, she has changed her diet and has lost weight since last visit. She is avoiding carbohydrates. Fasting 95-105, post- prandially 150. Eye exam is up to date She denies polyphagia or polydipsia but she has nocturia. No side effects of medication, no recent hypoglycemic episodes. No blurred vision. HgbA1C was 7.6% but is now up to 8.5% and now is down to 6.8%. Urine micro done last visit was normal   HTN: well controlled, no chest pain, no palpitation. Taking medication as prescribed, no side effects. Takes Furosemide prn only for severe swelling, discussed compression stocking hoses to control swelling - she states symptoms usually present during the Summer and has not taken medication lately for swelling   Hyperlipidemia: taking Atorvastatin, no myalgias.   AR: well controlled, taking singulair and is now off Benadryl and taking Loratadine daily.  She is doing well at this time,  denies rhinorrhea, sneezing  or nasal congestion   Obesity: her weight was up, but weight is usually 220 lbs and with life style modification she is back to 220 lbs.   OA: she has right knee pain, it started after a fall 3 years ago, she takes arthritis pain pill otc  Pain is described as dull and aching, intermittent but is doing well on tylenol   Patient Active Problem List   Diagnosis Date Noted  . Diabetic retinopathy ([place]) 11/12/2015  . Morbid obesity ([place]) 06/02/2015  . Osteoarthritis 06/02/2015  . Osteopenia 06/02/2015  . Narrowing of intervertebral disc space 06/02/2015  . Dyslipidemia associated with type 2 diabetes mellitus ([place]) 06/02/2015  . Perennial allergic rhinitis 02/28/2007  .  Benign essential HTN 06/26/2006  . Pure hypercholesterolemia 06/26/2006    Past Surgical History:  Procedure Laterality Date  . ABDOMINAL HYSTERECTOMY      Family History  Problem Relation Age of Onset  . Hypertension Mother   . Raynaud syndrome Daughter   . Hypertension Daughter     Social History   Social History  . Marital status: Married    Spouse name: N/A  . Number of children: N/A  . Years of education: N/A   Occupational History  . Not on file.   Social History Main Topics  . Smoking status: Never Smoker  . Smokeless tobacco: Never Used  . Alcohol use No  . Drug use: No  . Sexual activity: Not Currently   Other Topics Concern  . Not on file   Social History Narrative  . No narrative on file     Current Outpatient Prescriptions:  .  alendronate (FOSAMAX) 35 MG tablet, Take 1 tablet (35 mg total) by mouth once a week. Take with a full glass of water on an empty stomach., Disp: 12 tablet, Rfl: 2 .  aspirin 81 MG tablet, Take by mouth., Disp: , Rfl:  .  atorvastatin (LIPITOR) 40 MG tablet, Take 1 tablet (40 mg total) by mouth daily., Disp: 90 tablet, Rfl: 1 .  furosemide (LASIX) 40 MG tablet, Take 1 tablet (40 mg total) by mouth daily as needed., Disp: 90 tablet, Rfl: 0 .  Glucosamine Sulfate 500 MG TABS,  Take by mouth., Disp: , Rfl:  .  loratadine (CLARITIN) 10 MG tablet, Take 1 tablet (10 mg total) by mouth daily., Disp: 90 tablet, Rfl: 1 .  losartan-hydrochlorothiazide (HYZAAR) 100-12.5 MG tablet, Take 1 tablet by mouth daily., Disp: 90 tablet, Rfl: 1 .  metFORMIN (GLUCOPHAGE-XR) 750 MG 24 hr tablet, Take 1 tablet (750 mg total) by mouth daily with breakfast., Disp: 90 tablet, Rfl: 1 .  montelukast (SINGULAIR) 10 MG tablet, Take 1 tablet (10 mg total) by mouth every evening., Disp: 90 tablet, Rfl: 1 .  Potassium Chloride ER 20 MEQ TBCR, Take 10 mEq by mouth daily as needed. With lasix, Disp: , Rfl:  .  repaglinide (PRANDIN) 2 MG tablet, Take 1 tablet (2 mg  total) by mouth 3 (three) times daily., Disp: 270 tablet, Rfl: 1 .  Vitamin D, Ergocalciferol, (DRISDOL) 50000 units CAPS capsule, TAKE ONE CAPSULE BY MOUTH WEEKLY FOR 8 WEEKS THEN 1 CAPSULE EVERY MONTH, Disp: 12 capsule, Rfl: 0  Allergies  Allergen Reactions  . Lisinopril      ROS  Constitutional: Negative for fever , positive for weight change.  Respiratory: Negative for cough and shortness of breath.   Cardiovascular: Negative for chest pain or palpitations.  Gastrointestinal: Negative for abdominal pain, no bowel changes.  Musculoskeletal: Negative for gait problem or joint swelling.  Skin: Negative for rash.  Neurological: Negative for dizziness or headache.  No other specific complaints in a complete review of systems (except as listed in HPI above).  Objective  Vitals:   02/09/16 1025  BP: 118/64  Pulse: (!) 103  Resp: 16  Temp: 98 F (36.7 C)  SpO2: 99%  Weight: 220 lb 5 oz (99.9 kg)  Height: 5\' 5"  (1.651 m)    Body mass index is 36.66 kg/m.  Physical Exam  Constitutional: Patient appears well-developed and well-nourished. Obese  No distress.  HEENT: head atraumatic, normocephalic, pupils equal and reactive to light,neck supple, throat within normal limits Cardiovascular: Normal rate, regular rhythm and normal heart sounds.  No murmur heard. BLE non-pitting edema. Pulmonary/Chest: Effort normal and breath sounds normal. No respiratory distress. Abdominal: Soft.  There is no tenderness. Psychiatric: Patient has a normal mood and affect. behavior is normal. Judgment and thought content normal.  PHQ2/9: Depression screen [facility] 2/9 02/09/2016 10/06/2015 06/02/2015  Decreased Interest 0 0 0  Down, Depressed, Hopeless 0 0 0  PHQ - 2 Score 0 0 0     Fall Risk: Fall Risk  02/09/2016 10/06/2015 06/02/2015  Falls in the past year? No No No    Functional Status Survey: Is the patient deaf or have difficulty hearing?: No Does the patient have difficulty seeing, even  when wearing glasses/contacts?: No Does the patient have difficulty concentrating, remembering, or making decisions?: No Does the patient have difficulty walking or climbing stairs?: No Does the patient have difficulty dressing or bathing?: No Does the patient have difficulty doing errands alone such as visiting a doctor's office or shopping?: No    Assessment & Plan  1. Dyslipidemia associated with type 2 diabetes mellitus (HCC)  - metFORMIN (GLUCOPHAGE-XR) 750 MG 24 hr tablet; Take 1 tablet (750 mg total) by mouth daily with breakfast.  Dispense: 90 tablet; Refill: 1 - atorvastatin (LIPITOR) 40 MG tablet; Take 1 tablet (40 mg total) by mouth daily.  Dispense: 90 tablet; Refill: 1 - repaglinide (PRANDIN) 2 MG tablet; Take 1 tablet (2 mg total) by mouth 3 (three) times daily.  Dispense: 270 tablet; Refill: 1 -  POCT HgB A1C  2. Benign essential HTN  - losartan-hydrochlorothiazide (HYZAAR) 100-12.5 MG tablet; Take 1 tablet by mouth daily.  Dispense: 90 tablet; Refill: 1  3. Morbid obesity ([place])  Losing weight, discussed getting medication but she wants to hold off for now, states secondary to life style changes  4. Perennial allergic rhinitis  - montelukast (SINGULAIR) 10 MG tablet; Take 1 tablet (10 mg total) by mouth every evening.  Dispense: 90 tablet; Refill: 1  5. Dyslipidemia  - atorvastatin (LIPITOR) 40 MG tablet; Take 1 tablet (40 mg total) by mouth daily.  Dispense: 90 tablet; Refill: 1

## 2016-04-11 ENCOUNTER — Ambulatory Visit (INDEPENDENT_AMBULATORY_CARE_PROVIDER_SITE_OTHER): Payer: Medicare Other

## 2016-04-11 VITALS — BP 126/68 | HR 80 | Temp 97.0°F | Ht 65.0 in | Wt 221.0 lb

## 2016-04-11 DIAGNOSIS — Z0000 Encounter for general adult medical examination without abnormal findings: Secondary | ICD-10-CM

## 2016-04-11 NOTE — Patient Instructions (Signed)
[name] , Thank you for taking time to come for your Medicare Wellness Visit. I appreciate your ongoing commitment to your health goals. Please review the following plan we discussed and let me know if I can assist you in the future.   Screening recommendations/referrals: Bone Density: last completed 11/06/13 Recommended yearly ophthalmology/optometry visit for glaucoma screening and checkup Recommended yearly dental visit for hygiene and checkup  Vaccinations: Influenza vaccine: done 10/06/15 Pneumococcal vaccine: completed series Tdap vaccine: done 05/25/10  Advanced directives: completed  Next appointment: 06/14/16 @ 10:20 AM   Preventive Care 2 Years and Older, Female Preventive care refers to lifestyle choices and visits with your health care provider that can promote health and wellness. What does preventive care include?  A yearly physical exam. This is also called an annual well check.  Dental exams once or twice a year.  Routine eye exams. Ask your health care provider how often you should have your eyes checked.  Personal lifestyle choices, including:  Daily care of your teeth and gums.  Regular physical activity.  Eating a healthy diet.  Avoiding tobacco and drug use.  Limiting alcohol use.  Practicing safe sex.  Taking low-dose aspirin every day.  Taking vitamin and mineral supplements as recommended by your health care provider. What happens during an annual well check? The services and screenings done by your health care provider during your annual well check will depend on your age, overall health, lifestyle risk factors, and family history of disease. Counseling  Your health care provider may ask you questions about your:  Alcohol use.  Tobacco use.  Drug use.  Emotional well-being.  Home and relationship well-being.  Sexual activity.  Eating habits.  History of falls.  Memory and ability to understand (cognition).  Work and work  Statistician.  Reproductive health. Screening  You may have the following tests or measurements:  Height, weight, and BMI.  Blood pressure.  Lipid and cholesterol levels. These may be checked every 5 years, or more frequently if you are over 47 years old.  Skin check.  Lung cancer screening. You may have this screening every year starting at age 88 if you have a 30-pack-year history of smoking and currently smoke or have quit within the past 15 years.  Fecal occult blood test (FOBT) of the stool. You may have this test every year starting at age 40.  Flexible sigmoidoscopy or colonoscopy. You may have a sigmoidoscopy every 5 years or a colonoscopy every 10 years starting at age 49.  Hepatitis C blood test.  Hepatitis B blood test.  Sexually transmitted disease (STD) testing.  Diabetes screening. This is done by checking your blood sugar (glucose) after you have not eaten for a while (fasting). You may have this done every 1-3 years.  Bone density scan. This is done to screen for osteoporosis. You may have this done starting at age 13.  Mammogram. This may be done every 1-2 years. Talk to your health care provider about how often you should have regular mammograms. Talk with your health care provider about your test results, treatment options, and if necessary, the need for more tests. Vaccines  Your health care provider may recommend certain vaccines, such as:  Influenza vaccine. This is recommended every year.  Tetanus, diphtheria, and acellular pertussis (Tdap, Td) vaccine. You may need a Td booster every 10 years.  Zoster vaccine. You may need this after age 35.  Pneumococcal 13-valent conjugate (PCV13) vaccine. One dose is recommended after age  65.  Pneumococcal polysaccharide (PPSV23) vaccine. One dose is recommended after age 41. Talk to your health care provider about which screenings and vaccines you need and how often you need them. This information is not  intended to replace advice given to you by your health care provider. Make sure you discuss any questions you have with your health care provider. Document Released: 01/16/2015 Document Revised: 09/09/2015 Document Reviewed: 10/21/2014 Elsevier Interactive Patient Education  2017 [place] Prevention in the Home Falls can cause injuries. They can happen to people of all ages. There are many things you can do to make your home safe and to help prevent falls. What can I do on the outside of my home?  Regularly fix the edges of walkways and driveways and fix any cracks.  Remove anything that might make you trip as you walk through a door, such as a raised step or threshold.  Trim any bushes or trees on the path to your home.  Use bright outdoor lighting.  Clear any walking paths of anything that might make someone trip, such as rocks or tools.  Regularly check to see if handrails are loose or broken. Make sure that both sides of any steps have handrails.  Any raised decks and porches should have guardrails on the edges.  Have any leaves, snow, or ice cleared regularly.  Use sand or salt on walking paths during winter.  Clean up any spills in your garage right away. This includes oil or grease spills. What can I do in the bathroom?  Use night lights.  Install grab bars by the toilet and in the tub and shower. Do not use towel bars as grab bars.  Use non-skid mats or decals in the tub or shower.  If you need to sit down in the shower, use a plastic, non-slip stool.  Keep the floor dry. Clean up any water that spills on the floor as soon as it happens.  Remove soap buildup in the tub or shower regularly.  Attach bath mats securely with double-sided non-slip rug tape.  Do not have throw rugs and other things on the floor that can make you trip. What can I do in the bedroom?  Use night lights.  Make sure that you have a light by your bed that is easy to reach.  Do  not use any sheets or blankets that are too big for your bed. They should not hang down onto the floor.  Have a firm chair that has side arms. You can use this for support while you get dressed.  Do not have throw rugs and other things on the floor that can make you trip. What can I do in the kitchen?  Clean up any spills right away.  Avoid walking on wet floors.  Keep items that you use a lot in easy-to-reach places.  If you need to reach something above you, use a strong step stool that has a grab bar.  Keep electrical cords out of the way.  Do not use floor polish or wax that makes floors slippery. If you must use wax, use non-skid floor wax.  Do not have throw rugs and other things on the floor that can make you trip. What can I do with my stairs?  Do not leave any items on the stairs.  Make sure that there are handrails on both sides of the stairs and use them. Fix handrails that are broken or loose. Make sure that handrails  are as long as the stairways.  Check any carpeting to make sure that it is firmly attached to the stairs. Fix any carpet that is loose or worn.  Avoid having throw rugs at the top or bottom of the stairs. If you do have throw rugs, attach them to the floor with carpet tape.  Make sure that you have a light switch at the top of the stairs and the bottom of the stairs. If you do not have them, ask someone to add them for you. What else can I do to help prevent falls?  Wear shoes that:  Do not have high heels.  Have rubber bottoms.  Are comfortable and fit you well.  Are closed at the toe. Do not wear sandals.  If you use a stepladder:  Make sure that it is fully opened. Do not climb a closed stepladder.  Make sure that both sides of the stepladder are locked into place.  Ask someone to hold it for you, if possible.  Clearly mark and make sure that you can see:  Any grab bars or handrails.  First and last steps.  Where the edge of each  step is.  Use tools that help you move around (mobility aids) if they are needed. These include:  Canes.  Walkers.  Scooters.  Crutches.  Turn on the lights when you go into a dark area. Replace any light bulbs as soon as they burn out.  Set up your furniture so you have a clear path. Avoid moving your furniture around.  If any of your floors are uneven, fix them.  If there are any pets around you, be aware of where they are.  Review your medicines with your doctor. Some medicines can make you feel dizzy. This can increase your chance of falling. Ask your doctor what other things that you can do to help prevent falls. This information is not intended to replace advice given to you by your health care provider. Make sure you discuss any questions you have with your health care provider. Document Released: 10/16/2008 Document Revised: 05/28/2015 Document Reviewed: 01/24/2014 Elsevier Interactive Patient Education  2017 [employer].

## 2016-04-11 NOTE — Progress Notes (Signed)
Subjective:   Beverly [name] is a 82 y.o. female who presents for an Initial Medicare Annual Wellness Visit.  Review of Systems    N/A  Cardiac Risk Factors include: advanced age (>46men, >85 women);diabetes mellitus;dyslipidemia;hypertension;obesity (BMI >30kg/m2)   Functional ability/safety issues: No Issues Hearing issues: No issues  Activities of daily living: Discussed Home safety issues: No Issues  End Of Life Planning: Completed  Preventative care, Health maintenance, Preventative health measures discussed.  Preventative screenings discussed today: lab work, colonoscopy,  mammogram, DEXA.  Low Dose CT Chest recommended if Age 80-80 years, 30 pack-year currently smoking OR have quit w/in 15years.   Lifestyle risk factor issued reviewed: Diet, exercise, weight management, advised patient smoking is not healthy, nutrition/diet.  Preventative health measures discussed (5-10 year plan).  Reviewed and recommended vaccinations: - Pneumovax  - Prevnar  - Annual Influenza - Zostavax - Tdap   Depression screening: Done Fall risk screening: Done Discuss ADLs/IADLs: Done  Current medical providers: See HPI  Other health risk factors identified this visit: No other issues Cognitive impairment issues: None identified  All above discussed with patient. Appropriate education, counseling and referral will be made based upon the above.       Objective:    Today's Vitals   04/11/16 1025 04/11/16 1031  BP: 126/68   Pulse: 80   Temp: 97 F (36.1 C)   TempSrc: Oral   Weight: 221 lb (100.2 kg)   Height: 5\' 5"  (1.651 m)   PainSc: 0-No pain 0-No pain   Body mass index is 36.78 kg/m.   Current Medications (verified) Outpatient Encounter Prescriptions as of 04/11/2016  Medication Sig  . alendronate (FOSAMAX) 35 MG tablet Take 1 tablet (35 mg total) by mouth once a week. Take with a full glass of water on an empty stomach.  Marland Kitchen aspirin 81 MG tablet Take by mouth.  Marland Kitchen  atorvastatin (LIPITOR) 40 MG tablet Take 1 tablet (40 mg total) by mouth daily.  . furosemide (LASIX) 40 MG tablet Take 1 tablet (40 mg total) by mouth daily as needed.  . Glucosamine Sulfate 500 MG TABS Take by mouth.  . loratadine (CLARITIN) 10 MG tablet Take 1 tablet (10 mg total) by mouth daily.  Marland Kitchen losartan-hydrochlorothiazide (HYZAAR) 100-12.5 MG tablet Take 1 tablet by mouth daily.  . metFORMIN (GLUCOPHAGE-XR) 750 MG 24 hr tablet Take 1 tablet (750 mg total) by mouth daily with breakfast.  . montelukast (SINGULAIR) 10 MG tablet Take 1 tablet (10 mg total) by mouth every evening.  . Multiple Vitamin (MULTI-VITAMINS) TABS Take by mouth.  . Potassium Chloride ER 20 MEQ TBCR Take 10 mEq by mouth daily as needed. With lasix  . repaglinide (PRANDIN) 2 MG tablet Take 1 tablet (2 mg total) by mouth 3 (three) times daily. (Patient taking differently: Take 2 mg by mouth 2 (two) times daily before a meal. )  . Vitamin D, Ergocalciferol, (DRISDOL) 50000 units CAPS capsule TAKE ONE CAPSULE BY MOUTH WEEKLY FOR 8 WEEKS THEN 1 CAPSULE EVERY MONTH  . Zinc Acetate 50 MG CAPS Take by mouth daily.    No facility-administered encounter medications on file as of 04/11/2016.     Allergies (verified) Lisinopril   History: Past Medical History:  Diagnosis Date  . Allergy   . Arthritis   . Diabetes mellitus without complication ([name])   . Hyperlipidemia   . Hypertension    Past Surgical History:  Procedure Laterality Date  . ABDOMINAL HYSTERECTOMY     Family  History  Problem Relation Age of Onset  . Hypertension Mother   . Raynaud syndrome Daughter   . Hypertension Daughter    Social History   Occupational History  . Not on file.   Social History Main Topics  . Smoking status: Never Smoker  . Smokeless tobacco: Never Used  . Alcohol use No  . Drug use: No  . Sexual activity: Not Currently    Tobacco Counseling Counseling given: Not Answered   Activities of Daily Living In your  present state of health, do you have any difficulty performing the following activities: 04/11/2016 02/09/2016  Hearing? N N  Vision? N N  Difficulty concentrating or making decisions? N N  Walking or climbing stairs? N N  Dressing or bathing? N N  Doing errands, shopping? N N  Preparing Food and eating ? N -  Using the Toilet? N -  In the past six months, have you accidently leaked urine? N -  Do you have problems with loss of bowel control? N -  Managing your Medications? N -  Managing your Finances? N -  Housekeeping or managing your Housekeeping? N -  Some recent data might be hidden    Immunizations and Health Maintenance Immunization History  Administered Date(s) Administered  . Influenza, High Dose Seasonal PF 10/06/2015  . Influenza-Unspecified 09/17/2013  . Pneumococcal Conjugate-13 09/17/2013  . Pneumococcal Polysaccharide-23 01/03/2001, 11/01/2012  . Tdap 05/25/2010  . Zoster 05/26/2010   There are no preventive care reminders to display for this patient.  Patient Care Team: Steele Sizer, MD as PCP - General (Family Medicine)  Indicate any recent Medical Services you may have received from other than [facility] providers in the past year (date may be approximate).     Assessment:   This is a routine wellness examination for [name].   Hearing/Vision screen Vision Screening Comments: Pt goes to [facility] vision checks once yearly.   Dietary issues and exercise activities discussed: Current Exercise Habits: Structured exercise class, Type of exercise: treadmill;Other - see comments;walking (cardio), Time (Minutes): 40, Frequency (Times/Week): 3, Weekly Exercise (Minutes/Week): 120, Intensity: Mild  Goals    . d    . Eat more vegetables          Recommend eating at least 2 servings of vegetables a day.      Depression Screen PHQ 2/9 Scores 04/11/2016 02/09/2016 10/06/2015 06/02/2015  PHQ - 2 Score 0 0 0 0    Fall Risk Fall Risk  04/11/2016 02/09/2016  10/06/2015 06/02/2015  Falls in the past year? No No No No    Cognitive Function:        Screening Tests Health Maintenance  Topic Date Due  . FOOT EXAM  06/01/2016  . INFLUENZA VACCINE  08/03/2016  . HEMOGLOBIN A1C  08/08/2016  . OPHTHALMOLOGY EXAM  11/09/2016  . TETANUS/TDAP  05/24/2020  . DEXA SCAN  Completed  . PNA vac Low Risk Adult  Completed      Plan:  I have personally reviewed and addressed the Medicare Annual Wellness questionnaire and have noted the following in the patient's chart:  A. Medical and social history B. Use of alcohol, tobacco or illicit drugs  C. Current medications and supplements D. Functional ability and status E.  Nutritional status F.  Physical activity G. Advance directives H. List of other physicians I.  Hospitalizations, surgeries, and ER visits in previous 12 months J.  [place] such as hearing and vision if needed, cognitive and  depression L. Referrals and appointments - none  In addition, I have reviewed and discussed with patient certain preventive protocols, quality metrics, and best practice recommendations. A written personalized care plan for preventive services as well as general preventive health recommendations were provided to patient.  See attached scanned questionnaire for additional information.   Signed,  Fabio Neighbors, LPN Nurse Health Advisor   MD Recommendations: None.   Records reviewed and agree with assessment and plan.  Steele Sizer

## 2016-06-05 ENCOUNTER — Other Ambulatory Visit: Payer: Self-pay | Admitting: Family Medicine

## 2016-06-05 DIAGNOSIS — M8580 Other specified disorders of bone density and structure, unspecified site: Secondary | ICD-10-CM

## 2016-06-06 ENCOUNTER — Encounter: Payer: Self-pay | Admitting: Family Medicine

## 2016-06-12 ENCOUNTER — Other Ambulatory Visit: Payer: Self-pay | Admitting: Family Medicine

## 2016-06-14 ENCOUNTER — Encounter: Payer: Self-pay | Admitting: Family Medicine

## 2016-06-14 ENCOUNTER — Ambulatory Visit (INDEPENDENT_AMBULATORY_CARE_PROVIDER_SITE_OTHER): Payer: Medicare Other | Admitting: Family Medicine

## 2016-06-14 VITALS — BP 124/70 | HR 100 | Temp 97.9°F | Resp 18 | Ht 65.0 in | Wt 223.0 lb

## 2016-06-14 DIAGNOSIS — E1169 Type 2 diabetes mellitus with other specified complication: Secondary | ICD-10-CM | POA: Diagnosis not present

## 2016-06-14 DIAGNOSIS — I10 Essential (primary) hypertension: Secondary | ICD-10-CM | POA: Diagnosis not present

## 2016-06-14 DIAGNOSIS — J3089 Other allergic rhinitis: Secondary | ICD-10-CM

## 2016-06-14 DIAGNOSIS — E538 Deficiency of other specified B group vitamins: Secondary | ICD-10-CM

## 2016-06-14 DIAGNOSIS — E785 Hyperlipidemia, unspecified: Secondary | ICD-10-CM | POA: Diagnosis not present

## 2016-06-14 LAB — POCT UA - MICROALBUMIN: Microalbumin Ur, POC: 20 mg/L

## 2016-06-14 LAB — POCT GLYCOSYLATED HEMOGLOBIN (HGB A1C): Hemoglobin A1C: 8.2

## 2016-06-14 MED ORDER — METFORMIN HCL ER 750 MG PO TB24: 750.0000 mg | ORAL_TABLET | Freq: Every day | ORAL | 1 refills | Status: DC

## 2016-06-14 MED ORDER — CYANOCOBALAMIN 1000 MCG/ML IJ SOLN
1000.0000 ug | Freq: Once | INTRAMUSCULAR | Status: DC
Start: 2016-06-14 — End: 2016-06-14

## 2016-06-14 MED ORDER — ATORVASTATIN CALCIUM 40 MG PO TABS: 40.0000 mg | ORAL_TABLET | Freq: Every day | ORAL | 1 refills | Status: DC

## 2016-06-14 MED ORDER — LOSARTAN POTASSIUM-HCTZ 100-12.5 MG PO TABS: 1.0000 | ORAL_TABLET | Freq: Every day | ORAL | 1 refills | Status: DC

## 2016-06-14 MED ORDER — MONTELUKAST SODIUM 10 MG PO TABS: 10.0000 mg | ORAL_TABLET | Freq: Every evening | ORAL | 1 refills | Status: DC

## 2016-06-14 NOTE — Progress Notes (Signed)
Name: Beverly [name]   MRN: [id number]    DOB: 20-Nov-1934   Date:06/14/2016       Progress Note  Subjective  Chief Complaint  Chief Complaint  Patient presents with  . Medication Refill    4 month F/U  . Diabetes    Checks every other day, Fasting sugar runs around 150-160  . Hypertension    Edema in the heat  . Hyperlipidemia  . Allergic Rhinitis     Well controlled    HPI  DMII: she has been taking only one pill of Metformin 750 XR daily and also Prandin before meals, she has added dry fruit on her oatmeal. She is avoiding carbohydrates. Fasting 90's post- prandially 140-150. Eye exam is up to date She denies polyphagia or polydipsia but she has nocturia - twice per night - stable. No side effects of medication, no recent hypoglycemic episodes. No blurred vision. HgbA1C was 7.6% but is now up to 8.5% and now is down to 6.8% and now up again to 8.2% . We will check urine micro today. Explained importance of compliance with her diet.   HTN: well controlled, no chest pain, no palpitation. Taking medication as prescribed, no side effects. Takes Furosemide prn only for severe swelling, discussed compression stocking hoses to control swelling - she states symptoms usually present during the Summer and has not taken medication lately for swelling . She does not need refills at this time  Hyperlipidemia: taking Atorvastatin, no myalgias. We will recheck levels  AR: well controlled, taking singulair and is now off Benadryl and taking Loratadine daily. She is doing well at this time, denies rhinorrhea, sneezing or nasal congestion   Obesity: her weight has been stable, only a few pounds of weight gain, but explained importance of losing weight, but following a strict diabetic diet  OA: she has right knee pain, it started after a fall 3 years ago, she takes arthritis pain pill otc  Pain is described as dull and aching, intermittent but is doing well on tylenol    Patient Active  Problem List   Diagnosis Date Noted  . Diabetic retinopathy ([place]) 11/12/2015  . Morbid obesity ([place]) 06/02/2015  . Osteoarthritis 06/02/2015  . Osteopenia 06/02/2015  . Narrowing of intervertebral disc space 06/02/2015  . Dyslipidemia associated with type 2 diabetes mellitus ([place]) 06/02/2015  . Perennial allergic rhinitis 02/28/2007  . Benign essential HTN 06/26/2006  . Pure hypercholesterolemia 06/26/2006    Past Surgical History:  Procedure Laterality Date  . ABDOMINAL HYSTERECTOMY      Family History  Problem Relation Age of Onset  . Hypertension Mother   . Raynaud syndrome Daughter   . Hypertension Daughter   . Stroke Sister     Social History   Social History  . Marital status: Married    Spouse name: N/A  . Number of children: N/A  . Years of education: N/A   Occupational History  . Not on file.   Social History Main Topics  . Smoking status: Never Smoker  . Smokeless tobacco: Never Used  . Alcohol use No  . Drug use: No  . Sexual activity: Not Currently   Other Topics Concern  . Not on file   Social History Narrative  . No narrative on file     Current Outpatient Prescriptions:  .  alendronate (FOSAMAX) 35 MG tablet, TAKE 1 TABLET BY MOUTH EVERY WEEK. TAKE WITH WATER ON EMPTY STOMACH, Disp: 12 tablet, Rfl: 0 .  aspirin 81  MG tablet, Take by mouth., Disp: , Rfl:  .  atorvastatin (LIPITOR) 40 MG tablet, Take 1 tablet (40 mg total) by mouth daily., Disp: 90 tablet, Rfl: 1 .  furosemide (LASIX) 40 MG tablet, Take 1 tablet (40 mg total) by mouth daily as needed., Disp: 90 tablet, Rfl: 0 .  Glucosamine Sulfate 500 MG TABS, Take 1 tablet by mouth once a week. , Disp: , Rfl:  .  loratadine (CLARITIN) 10 MG tablet, Take 1 tablet (10 mg total) by mouth daily., Disp: 90 tablet, Rfl: 1 .  losartan-hydrochlorothiazide (HYZAAR) 100-12.5 MG tablet, Take 1 tablet by mouth daily., Disp: 90 tablet, Rfl: 1 .  metFORMIN (GLUCOPHAGE-XR) 750 MG 24 hr tablet, Take 1 tablet  (750 mg total) by mouth daily with breakfast., Disp: 90 tablet, Rfl: 1 .  montelukast (SINGULAIR) 10 MG tablet, Take 1 tablet (10 mg total) by mouth every evening., Disp: 90 tablet, Rfl: 1 .  Multiple Vitamin (MULTI-VITAMINS) TABS, Take by mouth., Disp: , Rfl:  .  Potassium Chloride ER 20 MEQ TBCR, Take 10 mEq by mouth daily as needed. With lasix, Disp: , Rfl:  .  repaglinide (PRANDIN) 2 MG tablet, Take 1 tablet (2 mg total) by mouth 3 (three) times daily. (Patient taking differently: Take 2 mg by mouth 2 (two) times daily before a meal. ), Disp: 270 tablet, Rfl: 1 .  Vitamin D, Ergocalciferol, (DRISDOL) 50000 units CAPS capsule, TAKE ONE CAPSULE BY MOUTH WEEKLY FOR 8 WEEKS THEN 1 CAPSULE EVERY MONTH (Patient taking differently: Take 50,000 Units by mouth every 30 (thirty) days. TAKE ONE CAPSULE BY MOUTH WEEKLY FOR 8 WEEKS THEN 1 CAPSULE EVERY MONTH), Disp: 12 capsule, Rfl: 0 .  Zinc Acetate 50 MG CAPS, Take by mouth daily. , Disp: , Rfl:   Allergies  Allergen Reactions  . Lisinopril      ROS  Constitutional: Negative for fever or weight change.  Respiratory: Negative for cough and shortness of breath.   Cardiovascular: Negative for chest pain or palpitations.  Gastrointestinal: Negative for abdominal pain, no bowel changes.  Musculoskeletal: Negative for gait problem or joint swelling.  Skin: Negative for rash.  Neurological: Negative for dizziness or headache.  No other specific complaints in a complete review of systems (except as listed in HPI above).  Objective  Vitals:   06/14/16 1048  BP: 124/70  Pulse: 100  Resp: 18  Temp: 97.9 F (36.6 C)  TempSrc: Oral  SpO2: 98%  Weight: 223 lb (101.2 kg)  Height: 5\' 5"  (1.651 m)    Body mass index is 37.11 kg/m.  Physical Exam  Constitutional: Patient appears well-developed and well-nourished. Obese  No distress.  HEENT: head atraumatic, normocephalic, pupils equal and reactive to light, neck supple, throat within normal  limits Cardiovascular: Normal rate, regular rhythm and normal heart sounds.  No murmur heard. non-pitting  BLE edema. Pulmonary/Chest: Effort normal and breath sounds normal. No respiratory distress. Abdominal: Soft.  There is no tenderness. Psychiatric: Patient has a normal mood and affect. behavior is normal. Judgment and thought content normal.  Diabetic Foot Exam: Diabetic Foot Exam - Simple   Simple Foot Form Diabetic Foot exam was performed with the following findings:  Yes 06/14/2016 11:05 AM  Visual Inspection See comments:  Yes Sensation Testing Intact to touch and monofilament testing bilaterally:  Yes Pulse Check Posterior Tibialis and Dorsalis pulse intact bilaterally:  Yes Comments Thick toenails and dry skin       PHQ2/9: Depression screen [facility] 2/9 06/14/2016  04/11/2016 02/09/2016 10/06/2015 06/02/2015  Decreased Interest 0 0 0 0 0  Down, Depressed, Hopeless 0 0 0 0 0  PHQ - 2 Score 0 0 0 0 0     Fall Risk: Fall Risk  06/14/2016 04/11/2016 02/09/2016 10/06/2015 06/02/2015  Falls in the past year? No No No No No     Functional Status Survey: Is the patient deaf or have difficulty hearing?: No Does the patient have difficulty seeing, even when wearing glasses/contacts?: No Does the patient have difficulty concentrating, remembering, or making decisions?: No Does the patient have difficulty walking or climbing stairs?: No Does the patient have difficulty dressing or bathing?: No Does the patient have difficulty doing errands alone such as visiting a doctor's office or shopping?: No    Assessment & Plan  1. Dyslipidemia associated with type 2 diabetes mellitus (HCC)  - POCT HgB A1C - POCT UA - Microalbumin - atorvastatin (LIPITOR) 40 MG tablet; Take 1 tablet (40 mg total) by mouth daily.  Dispense: 90 tablet; Refill: 1 - metFORMIN (GLUCOPHAGE-XR) 750 MG 24 hr tablet; Take 1 tablet (750 mg total) by mouth daily with breakfast.  Dispense: 90 tablet; Refill: 1 Resume  diet  2. Benign essential HTN  - losartan-hydrochlorothiazide (HYZAAR) 100-12.5 MG tablet; Take 1 tablet by mouth daily.  Dispense: 90 tablet; Refill: 1 - COMPLETE METABOLIC PANEL WITH GFR  3. Morbid obesity ([place])  Discussed with the patient the risk posed by an increased BMI. Discussed importance of portion control, calorie counting and at least 150 minutes of physical activity weekly. Avoid sweet beverages and drink more water. Eat at least 6 servings of fruit and vegetables daily   4. Perennial allergic rhinitis  - montelukast (SINGULAIR) 10 MG tablet; Take 1 tablet (10 mg total) by mouth every evening.  Dispense: 90 tablet; Refill: 1  5. Dyslipidemia  - atorvastatin (LIPITOR) 40 MG tablet; Take 1 tablet (40 mg total) by mouth daily.  Dispense: 90 tablet; Refill: 1 - Lipid panel

## 2016-06-14 NOTE — Addendum Note (Signed)
Addended by: Inda Coke on: 06/14/2016 12:18 PM   Modules accepted: Orders

## 2016-06-17 LAB — COMPLETE METABOLIC PANEL WITH GFR
ALT: 11 U/L (ref 6–29)
AST: 18 U/L (ref 10–35)
Albumin: 3.9 g/dL (ref 3.6–5.1)
Alkaline Phosphatase: 84 U/L (ref 33–130)
BUN: 14 mg/dL (ref 7–25)
CALCIUM: 9.3 mg/dL (ref 8.6–10.4)
CHLORIDE: 102 mmol/L (ref 98–110)
CO2: 28 mmol/L (ref 20–31)
Creat: 0.95 mg/dL — ABNORMAL HIGH (ref 0.60–0.88)
GFR, EST AFRICAN AMERICAN: 65 mL/min (ref >=60)
GFR, Est Non African American: 57 mL/min — ABNORMAL LOW (ref >=60)
Glucose, Bld: 159 mg/dL — ABNORMAL HIGH (ref 65–99)
POTASSIUM: 3.9 mmol/L (ref 3.5–5.3)
Sodium: 140 mmol/L (ref 135–146)
Total Bilirubin: 0.5 mg/dL (ref 0.2–1.2)
Total Protein: 6.5 g/dL (ref 6.1–8.1)

## 2016-06-17 LAB — LIPID PANEL
CHOL/HDL RATIO: 2.4 ratio (ref <5.0)
CHOLESTEROL: 114 mg/dL (ref <200)
HDL: 48 mg/dL — AB (ref >50)
LDL Cholesterol: 48 mg/dL (ref <100)
TRIGLYCERIDES: 91 mg/dL (ref <150)
VLDL: 18 mg/dL (ref <30)

## 2016-08-13 ENCOUNTER — Other Ambulatory Visit: Payer: Self-pay | Admitting: Family Medicine

## 2016-08-13 DIAGNOSIS — E785 Hyperlipidemia, unspecified: Principal | ICD-10-CM

## 2016-08-13 DIAGNOSIS — E1169 Type 2 diabetes mellitus with other specified complication: Secondary | ICD-10-CM

## 2016-09-06 ENCOUNTER — Other Ambulatory Visit: Payer: Self-pay | Admitting: Family Medicine

## 2016-09-06 DIAGNOSIS — M8580 Other specified disorders of bone density and structure, unspecified site: Secondary | ICD-10-CM

## 2016-10-14 ENCOUNTER — Ambulatory Visit (INDEPENDENT_AMBULATORY_CARE_PROVIDER_SITE_OTHER): Payer: Medicare Other | Admitting: Family Medicine

## 2016-10-14 ENCOUNTER — Encounter: Payer: Self-pay | Admitting: Family Medicine

## 2016-10-14 VITALS — BP 116/50 | HR 86 | Resp 14 | Ht 65.0 in | Wt 217.3 lb

## 2016-10-14 DIAGNOSIS — E785 Hyperlipidemia, unspecified: Secondary | ICD-10-CM | POA: Diagnosis not present

## 2016-10-14 DIAGNOSIS — E113553 Type 2 diabetes mellitus with stable proliferative diabetic retinopathy, bilateral: Secondary | ICD-10-CM

## 2016-10-14 DIAGNOSIS — Z23 Encounter for immunization: Secondary | ICD-10-CM | POA: Diagnosis not present

## 2016-10-14 DIAGNOSIS — I10 Essential (primary) hypertension: Secondary | ICD-10-CM | POA: Diagnosis not present

## 2016-10-14 DIAGNOSIS — E1169 Type 2 diabetes mellitus with other specified complication: Secondary | ICD-10-CM | POA: Diagnosis not present

## 2016-10-14 DIAGNOSIS — E538 Deficiency of other specified B group vitamins: Secondary | ICD-10-CM

## 2016-10-14 DIAGNOSIS — M8580 Other specified disorders of bone density and structure, unspecified site: Secondary | ICD-10-CM

## 2016-10-14 DIAGNOSIS — Z79899 Other long term (current) drug therapy: Secondary | ICD-10-CM

## 2016-10-14 DIAGNOSIS — E1165 Type 2 diabetes mellitus with hyperglycemia: Secondary | ICD-10-CM | POA: Diagnosis not present

## 2016-10-14 DIAGNOSIS — I87303 Chronic venous hypertension (idiopathic) without complications of bilateral lower extremity: Secondary | ICD-10-CM

## 2016-10-14 LAB — POCT GLYCOSYLATED HEMOGLOBIN (HGB A1C): Hemoglobin A1C: 8.4

## 2016-10-14 MED ORDER — VITAMIN D 50 MCG (2000 UT) PO CAPS: 1.0000 | ORAL_CAPSULE | Freq: Every day | ORAL | 0 refills | Status: AC

## 2016-10-14 MED ORDER — ALENDRONATE SODIUM 35 MG PO TABS: ORAL_TABLET | ORAL | 3 refills | Status: DC

## 2016-10-14 MED ORDER — LOSARTAN POTASSIUM-HCTZ 100-12.5 MG PO TABS: 1.0000 | ORAL_TABLET | Freq: Every day | ORAL | 1 refills | Status: DC

## 2016-10-14 MED ORDER — B-12 1000 MCG SL SUBL: 1.0000 | SUBLINGUAL_TABLET | Freq: Every day | SUBLINGUAL | 2 refills | Status: DC

## 2016-10-14 MED ORDER — METFORMIN HCL ER 750 MG PO TB24: 1500.0000 mg | ORAL_TABLET | Freq: Every day | ORAL | 1 refills | Status: DC

## 2016-10-14 MED ORDER — ATORVASTATIN CALCIUM 40 MG PO TABS: 40.0000 mg | ORAL_TABLET | Freq: Every day | ORAL | 1 refills | Status: DC

## 2016-10-14 NOTE — Progress Notes (Signed)
Name: Beverly [name]   MRN: [id number]    DOB: 25-Mar-1934   Date:10/14/2016       Progress Note  Subjective  Chief Complaint  Chief Complaint  Patient presents with  . Hyperlipidemia  . Diabetes  . Hypertension    HPI   DMII: she has been taking only one pill of Metformin 750 XR , hgbA1c has gone up and we will increase Metformin to 1500 mg daily, taking Prandin before breakfast and dinner ( usually eats a very light lunch) She is avoiding bread . Fasting 95-105  post- prandially 150's Eye exam is up to date She denies polyphagia or polydipsia but she has nocturia - twice per night - stable. No side effects of medication, no recent hypoglycemic episodes. No blurred vision. HgbA1C was 7.6%,to 8.5%  down to 6.8% up to  8.2%, today is 8.4%. . Explained importance of compliance with her diet and we will adjust medications.   HTN: towards the low end of normal, no chest pain, no palpitation. Taking medication as prescribed, no side effects. Takes Furosemide prn only for severe swelling, discussed compression stocking hoses to control swelling - she states symptoms usually present during the Summer and has not taken medication lately for swelling .   Hyperlipidemia: taking Atorvastatin, no myalgias. Reviewed last labs with patient today   AR: well controlled, taking singulair and is now off Benadryl and taking Loratadine daily. She is doing well at this time, denies rhinorrhea, sneezing or nasal congestion    Obesity: she is doing well, walking a few times a week with her brother and going to the gym 3 times a week, she has lost 5 lbs since last visit.   OA: she has right knee pain, it started after a fall 3 years ago, she takes arthritis pain pill otc Pain is described as dull and aching, intermittent but is doing well on tylenol    Patient Active Problem List   Diagnosis Date Noted  . Diabetic retinopathy ([place]) 11/12/2015  . Morbid obesity ([place]) 06/02/2015  . Osteoarthritis  06/02/2015  . Osteopenia 06/02/2015  . Narrowing of intervertebral disc space 06/02/2015  . Dyslipidemia associated with type 2 diabetes mellitus ([place]) 06/02/2015  . Perennial allergic rhinitis 02/28/2007  . Benign essential HTN 06/26/2006  . Pure hypercholesterolemia 06/26/2006    Past Surgical History:  Procedure Laterality Date  . ABDOMINAL HYSTERECTOMY      Family History  Problem Relation Age of Onset  . Hypertension Mother   . Raynaud syndrome Daughter   . Hypertension Daughter   . Stroke Sister     Social History   Social History  . Marital status: Married    Spouse name: N/A  . Number of children: N/A  . Years of education: N/A   Occupational History  . Not on file.   Social History Main Topics  . Smoking status: Never Smoker  . Smokeless tobacco: Never Used  . Alcohol use No  . Drug use: No  . Sexual activity: Not Currently   Other Topics Concern  . Not on file   Social History Narrative  . No narrative on file     Current Outpatient Prescriptions:  .  alendronate (FOSAMAX) 35 MG tablet, TAKE 1 TABLET BY MOUTH EVERY WEEK. TAKE WITH WATER ON EMPTY STOMACH, Disp: 12 tablet, Rfl: 3 .  aspirin 81 MG tablet, Take by mouth., Disp: , Rfl:  .  atorvastatin (LIPITOR) 40 MG tablet, Take 1 tablet (40 mg total) by  mouth daily., Disp: 90 tablet, Rfl: 1 .  furosemide (LASIX) 40 MG tablet, Take 1 tablet (40 mg total) by mouth daily as needed., Disp: 90 tablet, Rfl: 0 .  Glucosamine Sulfate 500 MG TABS, Take 1 tablet by mouth once a week. , Disp: , Rfl:  .  loratadine (CLARITIN) 10 MG tablet, Take 1 tablet (10 mg total) by mouth daily., Disp: 90 tablet, Rfl: 1 .  losartan-hydrochlorothiazide (HYZAAR) 100-12.5 MG tablet, Take 1 tablet by mouth daily., Disp: 90 tablet, Rfl: 1 .  metFORMIN (GLUCOPHAGE-XR) 750 MG 24 hr tablet, Take 2 tablets (1,500 mg total) by mouth daily with breakfast., Disp: 180 tablet, Rfl: 1 .  montelukast (SINGULAIR) 10 MG tablet, Take 1 tablet  (10 mg total) by mouth every evening., Disp: 90 tablet, Rfl: 1 .  Multiple Vitamin (MULTI-VITAMINS) TABS, Take by mouth., Disp: , Rfl:  .  Potassium Chloride ER 20 MEQ TBCR, Take 10 mEq by mouth daily as needed. With lasix, Disp: , Rfl:  .  repaglinide (PRANDIN) 2 MG tablet, TAKE 1 TABLET (2 MG TOTAL) BY MOUTH 3 (THREE) TIMES DAILY., Disp: 270 tablet, Rfl: 1 .  Zinc Acetate 50 MG CAPS, Take by mouth daily. , Disp: , Rfl:  .  Cholecalciferol (VITAMIN D) 2000 units CAPS, Take 1 capsule (2,000 Units total) by mouth daily., Disp: 30 capsule, Rfl: 0  Allergies  Allergen Reactions  . Lisinopril      ROS  Constitutional: Negative for fever , positive for mild  weight change.  Respiratory: Negative for cough and shortness of breath.   Cardiovascular: Negative for chest pain or palpitations.  Gastrointestinal: Negative for abdominal pain, no bowel changes.  Musculoskeletal: Negative for gait problem or joint swelling.  Skin: Negative for rash.  Neurological: Negative for dizziness or headache.  No other specific complaints in a complete review of systems (except as listed in HPI above  Objective  Vitals:   10/14/16 1039  BP: (!) 116/50  Pulse: 86  Resp: 14  SpO2: 98%  Weight: 217 lb 4.8 oz (98.6 kg)  Height: 5\' 5"  (1.651 m)    Body mass index is 36.16 kg/m.  Physical Exam   Constitutional: Patient appears well-developed and well-nourished. Obese  No distress.  HEENT: head atraumatic, normocephalic, pupils equal and reactive to light, neck supple, throat within normal limits Cardiovascular: Normal rate, regular rhythm and normal heart sounds.  No murmur heard. non-pitting  BLE edema. Pulmonary/Chest: Effort normal and breath sounds normal. No respiratory distress. Abdominal: Soft.  There is no tenderness. Psychiatric: Patient has a normal mood and affect. behavior is normal. Judgment and thought content normal.  Recent Results (from the past 2160 hour(s))  POCT HgB A1C      Status: Abnormal   Collection Time: 10/14/16 10:55 AM  Result Value Ref Range   Hemoglobin A1C 8.4      PHQ2/9: Depression screen [facility] 2/9 06/14/2016 04/11/2016 02/09/2016 10/06/2015 06/02/2015  Decreased Interest 0 0 0 0 0  Down, Depressed, Hopeless 0 0 0 0 0  PHQ - 2 Score 0 0 0 0 0     Fall Risk: Fall Risk  10/14/2016 06/14/2016 04/11/2016 02/09/2016 10/06/2015  Falls in the past year? No No No No No    Assessment & Plan  1. Dyslipidemia associated with type 2 diabetes mellitus (HCC)  Increase dose of Metformin  - POCT HgB A1C - atorvastatin (LIPITOR) 40 MG tablet; Take 1 tablet (40 mg total) by mouth daily.  Dispense: 90 tablet; Refill:  1 - metFORMIN (GLUCOPHAGE-XR) 750 MG 24 hr tablet; Take 2 tablets (1,500 mg total) by mouth daily with breakfast.  Dispense: 180 tablet; Refill: 1  2. Need for immunization against influenza  - Flu Vaccine QUAD 36+ mos IM  3. Benign essential HTN  - losartan-hydrochlorothiazide (HYZAAR) 100-12.5 MG tablet; Take 1 tablet by mouth daily.  Dispense: 90 tablet; Refill: 1 She states bp at home is normal 120-130's and denies dizziness, she only takes lasix prn and told her to take half dose of losartan/hctz the days she takes lasix  4. Morbid obesity ([place])  Discussed with the patient the risk posed by an increased BMI. Discussed importance of portion control, calorie counting and at least 150 minutes of physical activity weekly. Avoid sweet beverages and drink more water. Eat at least 6 servings of fruit and vegetables daily   5. B12 deficiency  - B12 SL daily   6. Stasis edema of both lower extremities   7. Type 2 diabetes mellitus with hyperglycemia, without long-term current use of insulin (HCC)   8. Uncontrolled type 2 diabetes mellitus with hyperglycemia (HCC)  We will increase dose of Metformin and recheck comp panel is one month, last GFR was normal   9. Osteopenia, unspecified location  - Cholecalciferol (VITAMIN D) 2000 units CAPS;  Take 1 capsule (2,000 Units total) by mouth daily.  Dispense: 30 capsule; Refill: 0 - alendronate (FOSAMAX) 35 MG tablet; TAKE 1 TABLET BY MOUTH EVERY WEEK. TAKE WITH WATER ON EMPTY STOMACH  Dispense: 12 tablet; Refill: 3  10. Dyslipidemia  - atorvastatin (LIPITOR) 40 MG tablet; Take 1 tablet (40 mg total) by mouth daily.  Dispense: 90 tablet; Refill: 1  11. Long-term use of high-risk medication  - COMPLETE METABOLIC PANEL WITH GFR

## 2016-10-14 NOTE — Patient Instructions (Signed)
Start over the counter vitamin D 2000 units daily, and also B12 sub-lingual 1000 mcg daily

## 2016-11-07 ENCOUNTER — Other Ambulatory Visit: Payer: Self-pay

## 2016-11-07 DIAGNOSIS — E1169 Type 2 diabetes mellitus with other specified complication: Secondary | ICD-10-CM

## 2016-11-07 DIAGNOSIS — E785 Hyperlipidemia, unspecified: Principal | ICD-10-CM

## 2016-11-07 MED ORDER — METFORMIN HCL ER 750 MG PO TB24: 1500.0000 mg | ORAL_TABLET | Freq: Every day | ORAL | 0 refills | Status: DC

## 2016-11-07 NOTE — Telephone Encounter (Signed)
Refill request for diabetic medication:  Metformin XR 750 mg  Last office visit pertaining to diabetes: 10/14/2016   Lab Results  Component Value Date   HGBA1C 8.4 10/14/2016    Follow up visit: 02/14/2017

## 2016-11-09 ENCOUNTER — Other Ambulatory Visit: Payer: Self-pay

## 2016-11-09 DIAGNOSIS — E1169 Type 2 diabetes mellitus with other specified complication: Secondary | ICD-10-CM

## 2016-11-09 DIAGNOSIS — E785 Hyperlipidemia, unspecified: Principal | ICD-10-CM

## 2016-11-09 MED ORDER — METFORMIN HCL ER 750 MG PO TB24: 1500.0000 mg | ORAL_TABLET | Freq: Every day | ORAL | 0 refills | Status: DC

## 2016-11-09 NOTE — Telephone Encounter (Signed)
Refill request for diabetic medication:  Metformin XR  Last office visit pertaining to diabetes: 06/14/2016  Lab Results  Component Value Date   HGBA1C 8.4 10/14/2016    Follow up visit: 02/14/2017

## 2016-11-14 LAB — COMPLETE METABOLIC PANEL WITH GFR
AG RATIO: 1.4 (calc) (ref 1.0–2.5)
ALT: 9 U/L (ref 6–29)
AST: 19 U/L (ref 10–35)
Albumin: 4 g/dL (ref 3.6–5.1)
Alkaline phosphatase (APISO): 59 U/L (ref 33–130)
BILIRUBIN TOTAL: 0.5 mg/dL (ref 0.2–1.2)
BUN/Creatinine Ratio: 23 (calc) — ABNORMAL HIGH (ref 6–22)
BUN: 21 mg/dL (ref 7–25)
CHLORIDE: 104 mmol/L (ref 98–110)
CO2: 29 mmol/L (ref 20–32)
Calcium: 9.4 mg/dL (ref 8.6–10.4)
Creat: 0.93 mg/dL — ABNORMAL HIGH (ref 0.60–0.88)
GFR, Est African American: 67 mL/min/{1.73_m2} (ref >=60)
GFR, Est Non African American: 58 mL/min/{1.73_m2} — ABNORMAL LOW (ref >=60)
Globulin: 2.8 g/dL (calc) (ref 1.9–3.7)
Glucose, Bld: 92 mg/dL (ref 65–99)
POTASSIUM: 4 mmol/L (ref 3.5–5.3)
Sodium: 141 mmol/L (ref 135–146)
Total Protein: 6.8 g/dL (ref 6.1–8.1)

## 2016-11-28 LAB — HM DIABETES EYE EXAM

## 2016-11-30 ENCOUNTER — Encounter: Payer: Self-pay | Admitting: Family Medicine

## 2016-12-08 ENCOUNTER — Other Ambulatory Visit: Payer: Self-pay | Admitting: Family Medicine

## 2016-12-08 DIAGNOSIS — M8580 Other specified disorders of bone density and structure, unspecified site: Secondary | ICD-10-CM

## 2016-12-08 NOTE — Telephone Encounter (Signed)
Patient requesting refill of Fosamax to [facility].  

## 2016-12-17 ENCOUNTER — Other Ambulatory Visit: Payer: Self-pay

## 2016-12-17 DIAGNOSIS — M8580 Other specified disorders of bone density and structure, unspecified site: Secondary | ICD-10-CM

## 2016-12-17 NOTE — Telephone Encounter (Signed)
Refill request for general medication: Fosamax 35 mg  Last office visit: 10/14/2016  Last physical exam: 04/092018  Follow up visit: 02/14/2017

## 2016-12-18 MED ORDER — ALENDRONATE SODIUM 35 MG PO TABS: ORAL_TABLET | ORAL | 0 refills | Status: DC

## 2016-12-23 ENCOUNTER — Other Ambulatory Visit: Payer: Self-pay | Admitting: Family Medicine

## 2016-12-23 DIAGNOSIS — M8580 Other specified disorders of bone density and structure, unspecified site: Secondary | ICD-10-CM

## 2017-02-14 ENCOUNTER — Ambulatory Visit: Payer: Medicare Other | Admitting: Family Medicine

## 2017-02-15 ENCOUNTER — Other Ambulatory Visit: Payer: Self-pay | Admitting: Family Medicine

## 2017-02-15 DIAGNOSIS — E785 Hyperlipidemia, unspecified: Secondary | ICD-10-CM

## 2017-02-15 DIAGNOSIS — E1169 Type 2 diabetes mellitus with other specified complication: Secondary | ICD-10-CM

## 2017-02-20 ENCOUNTER — Other Ambulatory Visit: Payer: Self-pay | Admitting: Family Medicine

## 2017-02-20 DIAGNOSIS — E785 Hyperlipidemia, unspecified: Secondary | ICD-10-CM

## 2017-02-20 DIAGNOSIS — E1169 Type 2 diabetes mellitus with other specified complication: Secondary | ICD-10-CM

## 2017-02-20 NOTE — Telephone Encounter (Signed)
Refill Request for Cholesterol medication. Atorvastatin to [facility]  Last visit: 10/14/2016   Lab Results  Component Value Date   CHOL 114 06/17/2016   HDL 48 (L) 06/17/2016   LDLCALC 48 06/17/2016   TRIG 91 06/17/2016   CHOLHDL 2.4 06/17/2016    Follow up on 02/23/17

## 2017-02-23 ENCOUNTER — Encounter: Payer: Self-pay | Admitting: Family Medicine

## 2017-02-23 ENCOUNTER — Ambulatory Visit: Payer: Medicare Other | Admitting: Family Medicine

## 2017-02-23 VITALS — BP 108/60 | HR 79 | Resp 14 | Ht 65.0 in | Wt 213.5 lb

## 2017-02-23 DIAGNOSIS — E113553 Type 2 diabetes mellitus with stable proliferative diabetic retinopathy, bilateral: Secondary | ICD-10-CM | POA: Diagnosis not present

## 2017-02-23 DIAGNOSIS — I10 Essential (primary) hypertension: Secondary | ICD-10-CM

## 2017-02-23 DIAGNOSIS — E1169 Type 2 diabetes mellitus with other specified complication: Secondary | ICD-10-CM

## 2017-02-23 DIAGNOSIS — J3089 Other allergic rhinitis: Secondary | ICD-10-CM | POA: Diagnosis not present

## 2017-02-23 DIAGNOSIS — E785 Hyperlipidemia, unspecified: Secondary | ICD-10-CM | POA: Diagnosis not present

## 2017-02-23 DIAGNOSIS — M8580 Other specified disorders of bone density and structure, unspecified site: Secondary | ICD-10-CM

## 2017-02-23 DIAGNOSIS — I87303 Chronic venous hypertension (idiopathic) without complications of bilateral lower extremity: Secondary | ICD-10-CM

## 2017-02-23 LAB — POCT GLYCOSYLATED HEMOGLOBIN (HGB A1C): Hemoglobin A1C: 6.7

## 2017-02-23 MED ORDER — ALENDRONATE SODIUM 35 MG PO TABS: ORAL_TABLET | ORAL | 1 refills | Status: DC

## 2017-02-23 MED ORDER — ATORVASTATIN CALCIUM 40 MG PO TABS: 40.0000 mg | ORAL_TABLET | Freq: Every day | ORAL | 1 refills | Status: DC

## 2017-02-23 MED ORDER — METFORMIN HCL ER 750 MG PO TB24: 1500.0000 mg | ORAL_TABLET | Freq: Every day | ORAL | 1 refills | Status: DC

## 2017-02-23 MED ORDER — REPAGLINIDE 2 MG PO TABS
2.0000 mg | ORAL_TABLET | Freq: Three times a day (TID) | ORAL | 1 refills | Status: DC
Start: 2017-02-23 — End: 2017-06-23

## 2017-02-23 MED ORDER — MONTELUKAST SODIUM 10 MG PO TABS: 10.0000 mg | ORAL_TABLET | Freq: Every evening | ORAL | 1 refills | Status: DC

## 2017-02-23 MED ORDER — LOSARTAN POTASSIUM-HCTZ 50-12.5 MG PO TABS
1.0000 | ORAL_TABLET | Freq: Every day | ORAL | 3 refills | Status: DC
Start: 2017-02-23 — End: 2017-10-24

## 2017-02-23 NOTE — Progress Notes (Signed)
Name: Beverly [name]   MRN: [id number]    DOB: 20-Jan-1934   Date:02/23/2017       Progress Note  Subjective  Chief Complaint  Chief Complaint  Patient presents with  . Diabetes  . Hypertension  . Hyperlipidemia    HPI   DMII: she has been taking  Metformin to 1500 mg daily, taking Prandin before breakfast and dinner ( usually eats a very light lunch) . Fasting 95-105 post- prandially 150's Eye exam is up to date She denies polyphagia or polydipsia but she has nocturia - twice per night - stable. No side effects of medication, no recent hypoglycemic episodes. No blurred vision. HgbA1C was 7.6%,to 8.5%  down to 6.8% up to  8.2%, today is 8.4%, today it is down to 6.7%, she stopped eating dry fruit. . Explained importance of compliance with her diet and we will adjust medications.  HTN: towards the low end of normal, no chest pain, no palpitation. Taking medication as prescribed, no side effects. Takes Furosemide prn only for severe swelling, discussed compression stocking hoses to control swelling - she states symptoms usually present during the Summer and has not taken medication lately for swelling . We will decrease dose of Losartan/hctz today   Hyperlipidemia: taking Atorvastatin, no myalgias. Reviewed last labs with patient today   AR: well controlled, taking singulair and is now off Benadryl and taking Loratadine daily. She is doing well at this time, denies rhinorrhea, sneezing or nasal congestion    Obesity: she is doing well, she lost 7 lbs since last visit, she is eating healthier and avoiding dry fruit because of diabetes.   OA: she has right knee pain, it started after a fall 3 years ago, she takes arthritis pain pill otc Pain is described as dull and aching, intermittent but is doing well on tylenol     Patient Active Problem List   Diagnosis Date Noted  . Diabetic retinopathy ([place]) 11/12/2015  . Morbid obesity ([place]) 06/02/2015  . Osteoarthritis 06/02/2015  .  Osteopenia 06/02/2015  . Narrowing of intervertebral disc space 06/02/2015  . Dyslipidemia associated with type 2 diabetes mellitus ([place]) 06/02/2015  . Perennial allergic rhinitis 02/28/2007  . Benign essential HTN 06/26/2006  . Pure hypercholesterolemia 06/26/2006    Past Surgical History:  Procedure Laterality Date  . ABDOMINAL HYSTERECTOMY      Family History  Problem Relation Age of Onset  . Hypertension Mother   . Raynaud syndrome Daughter   . Hypertension Daughter   . Stroke Sister     Social History   Socioeconomic History  . Marital status: Married    Spouse name: Not on file  . Number of children: Not on file  . Years of education: Not on file  . Highest education level: Not on file  Social Needs  . Financial resource strain: Not on file  . Food insecurity - worry: Not on file  . Food insecurity - inability: Not on file  . Transportation needs - medical: Not on file  . Transportation needs - non-medical: Not on file  Occupational History  . Not on file  Tobacco Use  . Smoking status: Never Smoker  . Smokeless tobacco: Never Used  Substance and Sexual Activity  . Alcohol use: No    Alcohol/week: 0.0 oz  . Drug use: No  . Sexual activity: Not Currently  Other Topics Concern  . Not on file  Social History Narrative  . Not on file     Current  Outpatient Medications:  .  alendronate (FOSAMAX) 35 MG tablet, TAKE 1 TABLET BY MOUTH EVERY WEEK. TAKE WITH WATER ON EMPTY STOMACH, Disp: 12 tablet, Rfl: 1 .  aspirin 81 MG tablet, Take by mouth., Disp: , Rfl:  .  atorvastatin (LIPITOR) 40 MG tablet, Take 1 tablet (40 mg total) by mouth daily., Disp: 90 tablet, Rfl: 1 .  Cholecalciferol (VITAMIN D) 2000 units CAPS, Take 1 capsule (2,000 Units total) by mouth daily., Disp: 30 capsule, Rfl: 0 .  Cyanocobalamin (B-12) 1000 MCG SUBL, Place 1 tablet under the tongue daily., Disp: 100 each, Rfl: 2 .  furosemide (LASIX) 40 MG tablet, Take 1 tablet (40 mg total) by mouth  daily as needed., Disp: 90 tablet, Rfl: 0 .  Glucosamine Sulfate 500 MG TABS, Take 1 tablet by mouth once a week. , Disp: , Rfl:  .  loratadine (CLARITIN) 10 MG tablet, Take 1 tablet (10 mg total) by mouth daily., Disp: 90 tablet, Rfl: 1 .  metFORMIN (GLUCOPHAGE-XR) 750 MG 24 hr tablet, Take 2 tablets (1,500 mg total) by mouth daily with breakfast., Disp: 180 tablet, Rfl: 1 .  montelukast (SINGULAIR) 10 MG tablet, Take 1 tablet (10 mg total) by mouth every evening., Disp: 90 tablet, Rfl: 1 .  Multiple Vitamin (MULTI-VITAMINS) TABS, Take by mouth., Disp: , Rfl:  .  Potassium Chloride ER 20 MEQ TBCR, Take 10 mEq by mouth daily as needed. With lasix, Disp: , Rfl:  .  repaglinide (PRANDIN) 2 MG tablet, Take 1 tablet (2 mg total) by mouth 3 (three) times daily., Disp: 270 tablet, Rfl: 1 .  Zinc Acetate 50 MG CAPS, Take by mouth daily. , Disp: , Rfl:  .  losartan-hydrochlorothiazide (HYZAAR) 50-12.5 MG tablet, Take 1 tablet by mouth daily., Disp: 90 tablet, Rfl: 3  Allergies  Allergen Reactions  . Lisinopril      ROS  Constitutional: Negative for fever or significant  weight change.  Respiratory: Negative for cough and shortness of breath.   Cardiovascular: Negative for chest pain or palpitations.  Gastrointestinal: Negative for abdominal pain, no bowel changes.  Musculoskeletal: Negative for gait problem or joint swelling.  Skin: Negative for rash.  Neurological: Negative for dizziness or headache.  No other specific complaints in a complete review of systems (except as listed in HPI above).  Objective  Vitals:   02/23/17 1134  BP: 108/60  Pulse: 79  Resp: 14  SpO2: 98%  Weight: 213 lb 8 oz (96.8 kg)  Height: 5\' 5"  (1.651 m)    Body mass index is 35.53 kg/m.  Physical Exam  Constitutional: Patient appears well-developed and well-nourished. Obese  No distress.  HEENT: head atraumatic, normocephalic, pupils equal and reactive to light, neck supple, throat within normal  limits Cardiovascular: Normal rate, regular rhythm and normal heart sounds.  No murmur heard.non pitting  BLE edema. Pulmonary/Chest: Effort normal and breath sounds normal. No respiratory distress. Abdominal: Soft.  There is no tenderness. Psychiatric: Patient has a normal mood and affect. behavior is normal. Judgment and thought content normal.   Recent Results (from the past 2160 hour(s))  POCT HgB A1C     Status: Abnormal   Collection Time: 02/23/17 11:36 AM  Result Value Ref Range   Hemoglobin A1C 6.7       PHQ2/9: Depression screen [facility] 2/9 06/14/2016 04/11/2016 02/09/2016 10/06/2015 06/02/2015  Decreased Interest 0 0 0 0 0  Down, Depressed, Hopeless 0 0 0 0 0  PHQ - 2 Score 0 0 0 0  0     Fall Risk: Fall Risk  02/23/2017 10/14/2016 06/14/2016 04/11/2016 02/09/2016  Falls in the past year? No No No No No     Functional Status Survey: Is the patient deaf or have difficulty hearing?: No Does the patient have difficulty seeing, even when wearing glasses/contacts?: No Does the patient have difficulty concentrating, remembering, or making decisions?: No Does the patient have difficulty walking or climbing stairs?: No Does the patient have difficulty dressing or bathing?: No Does the patient have difficulty doing errands alone such as visiting a doctor's office or shopping?: No    Assessment & Plan  1. Dyslipidemia associated with type 2 diabetes mellitus (HCC)  - POCT HgB A1C - atorvastatin (LIPITOR) 40 MG tablet; Take 1 tablet (40 mg total) by mouth daily.  Dispense: 90 tablet; Refill: 1 - metFORMIN (GLUCOPHAGE-XR) 750 MG 24 hr tablet; Take 2 tablets (1,500 mg total) by mouth daily with breakfast.  Dispense: 180 tablet; Refill: 1 - repaglinide (PRANDIN) 2 MG tablet; Take 1 tablet (2 mg total) by mouth 3 (three) times daily.  Dispense: 270 tablet; Refill: 1  2. Morbid obesity ([place])  Discussed with the patient the risk posed by an increased BMI. Discussed importance of portion  control, calorie counting and at least 150 minutes of physical activity weekly. Avoid sweet beverages and drink more water. Eat at least 6 servings of fruit and vegetables daily   3. Benign essential HTN  I will decrease dose of Losartan because bp is towards low end of normal  - losartan-hydrochlorothiazide (HYZAAR) 50-12.5 MG tablet; Take 1 tablet by mouth daily.  Dispense: 90 tablet; Refill: 3  4. 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 Osteopenia, unspecified location  - alendronate (FOSAMAX) 35 MG tablet; TAKE 1 TABLET BY MOUTH EVERY WEEK. TAKE WITH WATER ON EMPTY STOMACH  Dispense: 12 tablet; Refill: 1  6. Stasis edema of both lower extremities  Takes medication prn   7. Perennial allergic rhinitis  - montelukast (SINGULAIR) 10 MG tablet; Take 1 tablet (10 mg total) by mouth every evening.  Dispense: 90 tablet; Refill: 1  8. Dyslipidemia  - atorvastatin (LIPITOR) 40 MG tablet; Take 1 tablet (40 mg total) by mouth daily.  Dispense: 90 tablet; Refill: 1

## 2017-02-23 NOTE — Patient Instructions (Signed)
Obesity, Adult  Obesity is the condition of having too much total body fat. Being overweight or obese means that your weight is greater than what is considered healthy for your body size. Obesity is determined by a measurement called BMI. BMI is an estimate of body fat and is calculated from height and weight. For adults, a BMI of 30 or higher is considered obese.  Obesity can eventually lead to other health concerns and major illnesses, including:  · Stroke.  · Coronary artery disease (CAD).  · Type 2 diabetes.  · Some types of cancer, including cancers of the colon, breast, uterus, and gallbladder.  · Osteoarthritis.  · High blood pressure (hypertension).  · High cholesterol.  · Sleep apnea.  · Gallbladder stones.  · Infertility problems.    What are the causes?  The main cause of obesity is taking in (consuming) more calories than your body uses for energy. Other factors that contribute to this condition may include:  · Being born with genes that make you more likely to become obese.  · Having a medical condition that causes obesity. These conditions include:  ? Hypothyroidism.  ? Polycystic ovarian syndrome (PCOS).  ? Binge-eating disorder.  ? Cushing syndrome.  · Taking certain medicines, such as steroids, antidepressants, and seizure medicines.  · Not being physically active (sedentary lifestyle).  · Living where there are limited places to exercise safely or buy healthy foods.  · Not getting enough sleep.    What increases the risk?  The following factors may increase your risk of this condition:  · Having a family history of obesity.  · Being a woman of African-American descent.  · Being a man of Hispanic descent.    What are the signs or symptoms?  Having excessive body fat is the main symptom of this condition.  How is this diagnosed?  This condition may be diagnosed based on:  · Your symptoms.  · Your medical history.  · A physical exam. Your health care provider may measure:  ? Your BMI. If you are an  adult with a BMI between 25 and less than 30, you are considered overweight. If you are an adult with a BMI of 30 or higher, you are considered obese.  ? The distances around your hips and your waist (circumferences). These may be compared to each other to help diagnose your condition.  ? Your skinfold thickness. Your health care provider may gently pinch a fold of your skin and measure it.    How is this treated?  Treatment for this condition often includes changing your lifestyle. Treatment may include some or all of the following:  · Dietary changes. Work with your health care provider and a dietitian to set a weight-loss goal that is healthy and reasonable for you. Dietary changes may include eating:  ? Smaller portions. A portion size is the amount of a particular food that is healthy for you to eat at one time. This varies from person to person.  ? Low-calorie or low-fat options.  ? More whole grains, fruits, and vegetables.  · Regular physical activity. This may include aerobic activity (cardio) and strength training.  · Medicine to help you lose weight. Your health care provider may prescribe medicine if you are unable to lose 1 pound a week after 6 weeks of eating more healthily and doing more physical activity.  · Surgery. Surgical options may include gastric banding and gastric bypass. Surgery may be done if:  ? Other   treatments have not helped to improve your condition.  ? You have a BMI of 40 or higher.  ? You have life-threatening health problems related to obesity.    Follow these instructions at home:    Eating and drinking    · Follow recommendations from your health care provider about what you eat and drink. Your health care provider may advise you to:  ? Limit fast foods, sweets, and processed snack foods.  ? Choose low-fat options, such as low-fat milk instead of whole milk.  ? Eat 5 or more servings of fruits or vegetables every day.  ? Eat at home more often. This gives you more control over  what you eat.  ? Choose healthy foods when you eat out.  ? Learn what a healthy portion size is.  ? Keep low-fat snacks on hand.  ? Avoid sugary drinks, such as soda, fruit juice, iced tea sweetened with sugar, and flavored milk.  ? Eat a healthy breakfast.  · Drink enough water to keep your urine clear or pale yellow.  · Do not go without eating for long periods of time (do not fast) or follow a fad diet. Fasting and fad diets can be unhealthy and even dangerous.  Physical Activity  · Exercise regularly, as told by your health care provider. Ask your health care provider what types of exercise are safe for you and how often you should exercise.  · Warm up and stretch before being active.  · Cool down and stretch after being active.  · Rest between periods of activity.  Lifestyle  · Limit the time that you spend in front of your TV, computer, or video game system.  · Find ways to reward yourself that do not involve food.  · Limit alcohol intake to no more than 1 drink a day for nonpregnant women and 2 drinks a day for men. One drink equals 12 oz of beer, 5 oz of wine, or 1½ oz of hard liquor.  General instructions  · Keep a weight loss journal to keep track of the food you eat and how much you exercise you get.  · Take over-the-counter and prescription medicines only as told by your health care provider.  · Take vitamins and supplements only as told by your health care provider.  · Consider joining a support group. Your health care provider may be able to recommend a support group.  · Keep all follow-up visits as told by your health care provider. This is important.  Contact a health care provider if:  · You are unable to meet your weight loss goal after 6 weeks of dietary and lifestyle changes.  This information is not intended to replace advice given to you by your health care provider. Make sure you discuss any questions you have with your health care provider.  Document Released: 01/28/2004 Document Revised:  05/25/2015 Document Reviewed: 10/08/2014  [employer]vier Interactive Patient Education © 2018 [employer]

## 2017-03-27 ENCOUNTER — Telehealth: Payer: Self-pay

## 2017-03-27 NOTE — Telephone Encounter (Signed)
Called pt to confirm appt with [facility] w/ [facility] on 03/28/17.

## 2017-03-28 ENCOUNTER — Ambulatory Visit: Payer: Medicare Other

## 2017-03-28 VITALS — BP 110/62 | HR 86 | Temp 97.4°F | Resp 12 | Ht 65.0 in | Wt 211.7 lb

## 2017-03-28 DIAGNOSIS — Z0000 Encounter for general adult medical examination without abnormal findings: Secondary | ICD-10-CM | POA: Diagnosis not present

## 2017-03-28 NOTE — Patient Instructions (Signed)
[name] , Thank you for taking time to come for your Medicare Wellness Visit. I appreciate your ongoing commitment to your health goals. Please review the following plan we discussed and let me know if I can assist you in the future.   Screening recommendations/referrals: Colorectal Screening: No longer required. Mammogram: No longer required. Bone Density: Osteoporotic screening no longer required Lung Cancer Screening: (Low Dose CT Chest recommended if Age 21-80 years, 30 pack-year currently smoking OR have quit w/in 15years.) Does not qualify.  Hepatitis C Screening: Does not qualify  Vision and Dental Exams: Recommended annual ophthalmology exams for early detection of glaucoma and other disorders of the eye Recommended annual dental exams for proper oral hygiene  Diabetic Exams: Recommended annual diabetic eye exams for early detection of retinopathy Recommended annual diabetic foot exams for early detection of peripheral neuropathy.  Diabetic Eye Exam: Completed 11/28/16 Diabetic Foot Exam: Completed 06/14/16; Pt is scheduled for diabetic foot exam on 06/23/17 with Dr. Ancil Boozer  Vaccinations: Influenza vaccine: Up to date Pneumococcal vaccine: Completed series Tdap vaccine: Up to date Shingles vaccine: Up to date. Please call your insurance company to determine your out of pocket expense for the Shingrix vaccine. You may also receive this vaccine at your local pharmacy or Health Dept.   Advanced directives: Please bring a copy of your POA (Power of Attorney) and/or Living Will to your next appointment.  Conditions/risks identified: Recommend to drink at least 6-8 8oz glasses of water per day.  Next appointment: Please schedule an appointment with Dr. Ancil Boozer within the next 30 days for follow up after today's Annual Wellness Visit.  Please schedule your Annual Wellness Visit with your Nurse Health Advisor in one year.  Preventive Care 22 Years and Older, Female Preventive care  refers to lifestyle choices and visits with your health care provider that can promote health and wellness. What does preventive care include?  A yearly physical exam. This is also called an annual well check.  Dental exams once or twice a year.  Routine eye exams. Ask your health care provider how often you should have your eyes checked.  Personal lifestyle choices, including:  Daily care of your teeth and gums.  Regular physical activity.  Eating a healthy diet.  Avoiding tobacco and drug use.  Limiting alcohol use.  Practicing safe sex.  Taking low-dose aspirin every day.  Taking vitamin and mineral supplements as recommended by your health care provider. What happens during an annual well check? The services and screenings done by your health care provider during your annual well check will depend on your age, overall health, lifestyle risk factors, and family history of disease. Counseling  Your health care provider may ask you questions about your:  Alcohol use.  Tobacco use.  Drug use.  Emotional well-being.  Home and relationship well-being.  Sexual activity.  Eating habits.  History of falls.  Memory and ability to understand (cognition).  Work and work Statistician.  Reproductive health. Screening  You may have the following tests or measurements:  Height, weight, and BMI.  Blood pressure.  Lipid and cholesterol levels. These may be checked every 5 years, or more frequently if you are over 40 years old.  Skin check.  Lung cancer screening. You may have this screening every year starting at age 9 if you have a 30-pack-year history of smoking and currently smoke or have quit within the past 15 years.  Fecal occult blood test (FOBT) of the stool. You may have this  test every year starting at age 33.  Flexible sigmoidoscopy or colonoscopy. You may have a sigmoidoscopy every 5 years or a colonoscopy every 10 years starting at age  68.  Hepatitis C blood test.  Hepatitis B blood test.  Sexually transmitted disease (STD) testing.  Diabetes screening. This is done by checking your blood sugar (glucose) after you have not eaten for a while (fasting). You may have this done every 1-3 years.  Bone density scan. This is done to screen for osteoporosis. You may have this done starting at age 28.  Mammogram. This may be done every 1-2 years. Talk to your health care provider about how often you should have regular mammograms. Talk with your health care provider about your test results, treatment options, and if necessary, the need for more tests. Vaccines  Your health care provider may recommend certain vaccines, such as:  Influenza vaccine. This is recommended every year.  Tetanus, diphtheria, and acellular pertussis (Tdap, Td) vaccine. You may need a Td booster every 10 years.  Zoster vaccine. You may need this after age 69.  Pneumococcal 13-valent conjugate (PCV13) vaccine. One dose is recommended after age 3.  Pneumococcal polysaccharide (PPSV23) vaccine. One dose is recommended after age 69. Talk to your health care provider about which screenings and vaccines you need and how often you need them. This information is not intended to replace advice given to you by your health care provider. Make sure you discuss any questions you have with your health care provider. Document Released: 01/16/2015 Document Revised: 09/09/2015 Document Reviewed: 10/21/2014 Elsevier Interactive Patient Education  2017 [place] Prevention in the Home Falls can cause injuries. They can happen to people of all ages. There are many things you can do to make your home safe and to help prevent falls. What can I do on the outside of my home?  Regularly fix the edges of walkways and driveways and fix any cracks.  Remove anything that might make you trip as you walk through a door, such as a raised step or threshold.  Trim  any bushes or trees on the path to your home.  Use bright outdoor lighting.  Clear any walking paths of anything that might make someone trip, such as rocks or tools.  Regularly check to see if handrails are loose or broken. Make sure that both sides of any steps have handrails.  Any raised decks and porches should have guardrails on the edges.  Have any leaves, snow, or ice cleared regularly.  Use sand or salt on walking paths during winter.  Clean up any spills in your garage right away. This includes oil or grease spills. What can I do in the bathroom?  Use night lights.  Install grab bars by the toilet and in the tub and shower. Do not use towel bars as grab bars.  Use non-skid mats or decals in the tub or shower.  If you need to sit down in the shower, use a plastic, non-slip stool.  Keep the floor dry. Clean up any water that spills on the floor as soon as it happens.  Remove soap buildup in the tub or shower regularly.  Attach bath mats securely with double-sided non-slip rug tape.  Do not have throw rugs and other things on the floor that can make you trip. What can I do in the bedroom?  Use night lights.  Make sure that you have a light by your bed that is easy to reach.  Do not use any sheets or blankets that are too big for your bed. They should not hang down onto the floor.  Have a firm chair that has side arms. You can use this for support while you get dressed.  Do not have throw rugs and other things on the floor that can make you trip. What can I do in the kitchen?  Clean up any spills right away.  Avoid walking on wet floors.  Keep items that you use a lot in easy-to-reach places.  If you need to reach something above you, use a strong step stool that has a grab bar.  Keep electrical cords out of the way.  Do not use floor polish or wax that makes floors slippery. If you must use wax, use non-skid floor wax.  Do not have throw rugs and other  things on the floor that can make you trip. What can I do with my stairs?  Do not leave any items on the stairs.  Make sure that there are handrails on both sides of the stairs and use them. Fix handrails that are broken or loose. Make sure that handrails are as long as the stairways.  Check any carpeting to make sure that it is firmly attached to the stairs. Fix any carpet that is loose or worn.  Avoid having throw rugs at the top or bottom of the stairs. If you do have throw rugs, attach them to the floor with carpet tape.  Make sure that you have a light switch at the top of the stairs and the bottom of the stairs. If you do not have them, ask someone to add them for you. What else can I do to help prevent falls?  Wear shoes that:  Do not have high heels.  Have rubber bottoms.  Are comfortable and fit you well.  Are closed at the toe. Do not wear sandals.  If you use a stepladder:  Make sure that it is fully opened. Do not climb a closed stepladder.  Make sure that both sides of the stepladder are locked into place.  Ask someone to hold it for you, if possible.  Clearly mark and make sure that you can see:  Any grab bars or handrails.  First and last steps.  Where the edge of each step is.  Use tools that help you move around (mobility aids) if they are needed. These include:  Canes.  Walkers.  Scooters.  Crutches.  Turn on the lights when you go into a dark area. Replace any light bulbs as soon as they burn out.  Set up your furniture so you have a clear path. Avoid moving your furniture around.  If any of your floors are uneven, fix them.  If there are any pets around you, be aware of where they are.  Review your medicines with your doctor. Some medicines can make you feel dizzy. This can increase your chance of falling. Ask your doctor what other things that you can do to help prevent falls. This information is not intended to replace advice given to  you by your health care provider. Make sure you discuss any questions you have with your health care provider. Document Released: 10/16/2008 Document Revised: 05/28/2015 Document Reviewed: 01/24/2014 Elsevier Interactive Patient Education  2017 [employer].

## 2017-03-28 NOTE — Progress Notes (Addendum)
Subjective:   Beverly [name] is a 83 y.o. female who presents for Medicare Annual (Subsequent) preventive examination.  Review of Systems:  N/A Cardiac Risk Factors include: advanced age (>85men, >66 women);dyslipidemia;hypertension;diabetes mellitus;obesity (BMI >30kg/m2)     Objective:     Vitals: BP 110/62 (BP Location: Left Arm, Patient Position: Sitting, Cuff Size: Large)   Pulse 86   Temp (!) 97.4 F (36.3 C) (Oral)   Resp 12   Ht 5\' 5"  (1.651 m)   Wt 211 lb 11.2 oz (96 kg)   SpO2 95%   BMI 35.23 kg/m   Body mass index is 35.23 kg/m.  Advanced Directives 03/28/2017 06/14/2016 04/11/2016 02/09/2016 10/06/2015 06/02/2015  Does Patient Have a Medical Advance Directive? Yes Yes Yes Yes Yes Yes  Type of Paramedic of [place];Living will [place];Living will Living will;Healthcare Power of [place];Living will Living will;Healthcare Power of [place];Living will  Does patient want to make changes to medical advance directive? Yes (MAU/Ambulatory/Procedural Areas - Information given) - - - No - Patient declined -  Copy of [place] in Chart? No - copy requested Yes Yes - No - copy requested No - copy requested    Tobacco Social History   Tobacco Use  Smoking Status Never Smoker  Smokeless Tobacco Never Used  Tobacco Comment   smoking cessation materials not required     Counseling given: No Comment: smoking cessation materials not required   Clinical Intake:  Pre-visit preparation completed: Yes  Pain : No/denies pain   BMI - recorded: 35.23 Nutritional Status: BMI > 30  Obese  Nutrition Risk Assessment: Has the patient had any N/V/D within the last 2 months?  No Does the patient have any non-healing wounds?  No Has the patient had any unintentional weight loss or weight gain?  No  Is the patient diabetic?  Yes If diabetic, was a CBG obtained  today?  No Did the patient bring in their glucometer from home?  No Comments: Pt monitors CBG's at home. Denies any financial strains with supplies or device.   Diabetic Exams: Diabetic Eye Exam: Completed 11/28/16 Diabetic Foot Exam: Completed 06/14/16; Pt is scheduled for diabetic foot exam with Dr. Cleda Mccreedy within the next 9 weeks.  How often do you need to have someone help you when you read instructions, pamphlets, or other written materials from your doctor or pharmacy?: 1 - Never  Interpreter Needed?: No  Information entered by :: [place], LPN  Hospitalizations, surgeries, and ER visits occurring within the previous 12 months:  Within the previous 12 months, pt has not underwent any surgical procedures, has not been hospitalized for any conditions and has not been treated by an emergency room clinician.  Past Medical History:  Diagnosis Date  . Allergy   . Arthritis   . Diabetes mellitus without complication ([place])   . Hyperlipidemia   . Hypertension    Past Surgical History:  Procedure Laterality Date  . ABDOMINAL HYSTERECTOMY     Family History  Problem Relation Age of Onset  . Hypertension Mother   . Healthy Father   . Healthy Daughter   . Raynaud syndrome Daughter   . Hypertension Daughter   . Stroke Sister    Social History   Socioeconomic History  . Marital status: Widowed    Spouse name: [name]  . Number of children: 2  . Years of education: Not on file  .  Highest education level: Bachelor's degree (e.g., BA, AB, BS)  Occupational History  . Occupation: Retired  Scientific laboratory technician  . Financial resource strain: Not hard at all  . Food insecurity:    Worry: Never true    Inability: Never true  . Transportation needs:    Medical: No    Non-medical: No  Tobacco Use  . Smoking status: Never Smoker  . Smokeless tobacco: Never Used  . Tobacco comment: smoking cessation materials not required  Substance and Sexual Activity  . Alcohol use: No    Alcohol/week:  0.0 oz  . Drug use: No  . Sexual activity: Not Currently  Lifestyle  . Physical activity:    Days per week: 3 days    Minutes per session: 40 min  . Stress: Not at all  Relationships  . Social connections:    Talks on phone: Patient refused    Gets together: Patient refused    Attends religious service: Patient refused    Active member of club or organization: Patient refused    Attends meetings of clubs or organizations: Patient refused    Relationship status: Widowed  Other Topics Concern  . Not on file  Social History Narrative  . Not on file    Outpatient Encounter Medications as of 03/28/2017  Medication Sig  . alendronate (FOSAMAX) 35 MG tablet TAKE 1 TABLET BY MOUTH EVERY WEEK. TAKE WITH WATER ON EMPTY STOMACH  . aspirin 81 MG tablet Take by mouth.  Marland Kitchen atorvastatin (LIPITOR) 40 MG tablet Take 1 tablet (40 mg total) by mouth daily.  . Cholecalciferol (VITAMIN D) 2000 units CAPS Take 1 capsule (2,000 Units total) by mouth daily.  . Cyanocobalamin (B-12) 1000 MCG SUBL Place 1 tablet under the tongue daily.  . furosemide (LASIX) 40 MG tablet Take 1 tablet (40 mg total) by mouth daily as needed.  . Glucosamine Sulfate 500 MG TABS Take 1 tablet by mouth once a week.   . loratadine (CLARITIN) 10 MG tablet Take 1 tablet (10 mg total) by mouth daily.  Marland Kitchen losartan-hydrochlorothiazide (HYZAAR) 50-12.5 MG tablet Take 1 tablet by mouth daily.  . metFORMIN (GLUCOPHAGE-XR) 750 MG 24 hr tablet Take 2 tablets (1,500 mg total) by mouth daily with breakfast.  . montelukast (SINGULAIR) 10 MG tablet Take 1 tablet (10 mg total) by mouth every evening.  . Multiple Vitamin (MULTI-VITAMINS) TABS Take by mouth.  . Potassium Chloride ER 20 MEQ TBCR Take 10 mEq by mouth daily as needed. With lasix  . repaglinide (PRANDIN) 2 MG tablet Take 1 tablet (2 mg total) by mouth 3 (three) times daily.  . Zinc Acetate 50 MG CAPS Take by mouth daily.    No facility-administered encounter medications on file as  of 03/28/2017.     Activities of Daily Living In your present state of health, do you have any difficulty performing the following activities: 03/28/2017 02/23/2017  Hearing? N N  Comment denies hearing aids -  Vision? N N  Comment wears reading glasses -  Difficulty concentrating or making decisions? N N  Walking or climbing stairs? N N  Dressing or bathing? N N  Doing errands, shopping? N N  Preparing Food and eating ? N -  Comment full set upper and lower dentures -  Using the Toilet? N -  In the past six months, have you accidently leaked urine? N -  Do you have problems with loss of bowel control? N -  Managing your Medications? N -  Managing your  Finances? N -  Housekeeping or managing your Housekeeping? N -  Some recent data might be hidden    Patient Care Team: Steele Sizer, MD as PCP - General (Family Medicine) Sharlotte Alamo, DPM as Consulting Physician (Podiatry)    Assessment:   This is a routine wellness examination for [facility].  Exercise Activities and Dietary recommendations Current Exercise Habits: Structured exercise class, Type of exercise: strength training/weights;stretching, Time (Minutes): 40, Frequency (Times/Week): 3, Weekly Exercise (Minutes/Week): 120, Intensity: Mild, Exercise limited by: None identified  Goals    . DIET - INCREASE WATER INTAKE     Recommend to drink at least 6-8 8oz glasses of water per day.       Fall Risk Fall Risk  03/28/2017 02/23/2017 10/14/2016 06/14/2016 04/11/2016  Falls in the past year? No No No No No  Risk for fall due to : History of fall(s);Impaired vision - - - -  Risk for fall due to: Comment fell on R knee at gas station - - - -   Is the home free of loose throw rugs in walkways, pet beds, electrical cords, etc? Yes Adequate lighting to reduce risk of falls?  Yes In addition, does the patient have any of the following: Stairs in or around the home WITH handrails? Yes Grab bars in the bathroom? Yes  Shower chair or  a place to sit while bathing? Yes Use of a cane, walker or w/c? No Use of an elevated toilet seat or a handicapped toilet? Yes  Timed Get Up and Go Performed: Yes. Pt ambulated 10 feet within 17 sec. Gait slow, steady and without the use of an assistive device. No intervention required at this time. Fall risk prevention has been discussed.  Community Resource Referral not required at this time.  Depression Screen PHQ 2/9 Scores 03/28/2017 03/28/2017 06/14/2016 04/11/2016  PHQ - 2 Score 0 0 0 0  PHQ- 9 Score 0 - - -     Cognitive Function     6CIT Screen 03/28/2017 04/11/2016  What Year? 0 points 0 points  What month? 0 points 0 points  What time? 0 points 0 points  Count back from 20 0 points 0 points  Months in reverse 0 points 0 points  Repeat phrase 0 points 0 points  Total Score 0 0    Immunization History  Administered Date(s) Administered  . Influenza Split 01/27/2003, 10/16/2006, 10/25/2007, 09/03/2009  . Influenza, High Dose Seasonal PF 10/06/2015  . Influenza, Seasonal, Injecte, Preservative Fre 09/28/2010, 10/03/2011  . Influenza,inj,Quad PF,6+ Mos 09/17/2013, 10/14/2016  . Influenza-Unspecified 09/17/2013  . Pneumococcal Conjugate-13 09/17/2013  . Pneumococcal Polysaccharide-23 01/03/2001, 11/01/2012  . Tdap 05/25/2010  . Zoster 05/26/2010    Qualifies for Shingles Vaccine? Yes. Zostavax completed 05/26/10. Due for Shingrix vaccine. Education has been provided regarding the importance of this vaccine. Pt has been advised to call her insurance company to determine her out of pocket expense. Advised she may also receive this vaccine at her local pharmacy or Health Dept. Verbalized acceptance and understanding.  Screening Tests Health Maintenance  Topic Date Due  . FOOT EXAM  06/14/2017  . HEMOGLOBIN A1C  08/23/2017  . OPHTHALMOLOGY EXAM  11/28/2017  . TETANUS/TDAP  05/24/2020  . INFLUENZA VACCINE  Completed  . DEXA SCAN  Completed  . PNA vac Low Risk Adult   Completed   Cancer Screenings: Colorectal Screening: No longer required. Mammogram: No longer required. Bone Density: Osteoporotic screening no longer required Lung Cancer Screening: (Low Dose CT Chest  recommended if Age 65-80 years, 32 pack-year currently smoking OR have quit w/in 15years.) Does not qualify.   Additional Screening: Hepatitis C Screening: Does not qualify  Diabetic Exams: Diabetic Eye Exam: Completed 11/28/16 Diabetic Foot Exam: Completed 06/14/16; Pt is scheduled for diabetic foot exam with Dr. Cleda Mccreedy within the next 9 weeks.  Vision and Dental Exams: Recommended annual ophthalmology exams for early detection of glaucoma and other disorders of the eye Recommended annual dental exams for proper oral hygiene  Additional Screenings: Hepatitis B/HIV/Syphillis: Does not qualify Hepatitis C Screening: Does not qualify    Plan:  I have personally reviewed and addressed the Medicare Annual Wellness questionnaire and have noted the following in the patient's chart:  A. Medical and social history B. Use of alcohol, tobacco or illicit drugs  C. Current medications and supplements D. Functional ability and status E.  Nutritional status F.  Physical activity G. Advance directives H. List of other physicians I.  Hospitalizations, surgeries, and ER visits in previous 12 months J.  [place] such as hearing and vision if needed, cognitive and depression L. Referrals and appointments - none  In addition, I have reviewed and discussed with patient certain preventive protocols, quality metrics, and best practice recommendations. A written personalized care plan for preventive services as well as general preventive health recommendations were provided to patient.  See attached scanned questionnaire for additional information.   Signed,  Aleatha Borer, LPN Nurse Health Advisor  I have reviewed this encounter including the documentation in this note and/or  discussed this patient with the provider, Aleatha Borer, LPN. I am certifying that I agree with the content of this note as supervising physician.  Steele Sizer, MD [facility] 03/28/2017, 4:54 PM

## 2017-06-23 ENCOUNTER — Ambulatory Visit: Payer: Medicare Other | Admitting: Family Medicine

## 2017-06-23 ENCOUNTER — Encounter: Payer: Self-pay | Admitting: Family Medicine

## 2017-06-23 VITALS — BP 130/70 | HR 77 | Resp 16 | Ht 65.0 in | Wt 205.9 lb

## 2017-06-23 DIAGNOSIS — Z79899 Other long term (current) drug therapy: Secondary | ICD-10-CM | POA: Diagnosis not present

## 2017-06-23 DIAGNOSIS — J3089 Other allergic rhinitis: Secondary | ICD-10-CM | POA: Diagnosis not present

## 2017-06-23 DIAGNOSIS — E785 Hyperlipidemia, unspecified: Secondary | ICD-10-CM | POA: Diagnosis not present

## 2017-06-23 DIAGNOSIS — I10 Essential (primary) hypertension: Secondary | ICD-10-CM | POA: Diagnosis not present

## 2017-06-23 DIAGNOSIS — E113553 Type 2 diabetes mellitus with stable proliferative diabetic retinopathy, bilateral: Secondary | ICD-10-CM

## 2017-06-23 DIAGNOSIS — E538 Deficiency of other specified B group vitamins: Secondary | ICD-10-CM

## 2017-06-23 DIAGNOSIS — R634 Abnormal weight loss: Secondary | ICD-10-CM

## 2017-06-23 DIAGNOSIS — M8580 Other specified disorders of bone density and structure, unspecified site: Secondary | ICD-10-CM | POA: Diagnosis not present

## 2017-06-23 DIAGNOSIS — E1169 Type 2 diabetes mellitus with other specified complication: Secondary | ICD-10-CM

## 2017-06-23 LAB — POCT GLYCOSYLATED HEMOGLOBIN (HGB A1C): HEMOGLOBIN A1C: 7.2 % — AB (ref 4.0–5.6)

## 2017-06-23 MED ORDER — ATORVASTATIN CALCIUM 40 MG PO TABS: 40.0000 mg | ORAL_TABLET | Freq: Every day | ORAL | 1 refills | Status: DC

## 2017-06-23 MED ORDER — MONTELUKAST SODIUM 10 MG PO TABS: 10.0000 mg | ORAL_TABLET | Freq: Every evening | ORAL | 1 refills | Status: DC

## 2017-06-23 MED ORDER — METFORMIN HCL ER 750 MG PO TB24: 1500.0000 mg | ORAL_TABLET | Freq: Every day | ORAL | 1 refills | Status: DC

## 2017-06-23 MED ORDER — REPAGLINIDE 2 MG PO TABS: 2.0000 mg | ORAL_TABLET | Freq: Three times a day (TID) | ORAL | 1 refills | Status: DC

## 2017-06-23 NOTE — Progress Notes (Signed)
Name: Beverly [name]   MRN: [id number]    DOB: 1934/03/22   Date:06/23/2017       Progress Note  Subjective  Chief Complaint  Chief Complaint  Patient presents with  . Hypertension  . Diabetes  . Dyslipidemia    HPI  DMII: she has been taking  Metformin to 1500 mg daily,takingPrandin before breakfast and dinner ( usually eats a very light lunch). Fasting 95-105post- prandially150'sEye exam is up to date She denies polyphagia or polydipsia but she has nocturia - twice per night - stable. No side effects of medication, no recent hypoglycemic episodes. No blurred vision. HgbA1C was 7.6%,to 8.5% down to 6.8% up to8.2%, today is 8.4%, down to 6.7%, up a little to 7.2%  She stopped eating dry fruit. .She is compliant with medications.   HTN:BP is at goal, no chest pain, no palpitation. Taking medication as prescribed, no side effects. Takes Furosemide prn only for severe swelling, discussed compression stocking hoses to control swelling - worse during the Summer . Doing well on current dose of bp medication  Hyperlipidemia: taking Atorvastatin, no myalgias.Recheck labs  AR: well controlled, taking singulair and is now off Benadryl and taking Loratadine daily. She is doing well at this time, denies rhinorrhea, sneezing or nasal congestion Unchanged    Obesity:she has lost 18 lbs in the past year, the only change was eliminating dry fruit. Discussed that since not much of a change it could be cancer or depression. She states does not think is cancer, she would like labs first and if abnormal consider further testing.   OA: she has right knee pain, it started after a fall 34years ago, she takes arthritis pain pill otc Pain is described as dull and aching, intermittent but is doing well on tylenol Unchanged   Unintentional weight loss: she has lost 18 lbs in the past year, she states she is not as hungry. She states under a lot of stress since last Summer. Her granddaughter (  father absent and her mother died when she was a child) had to be sent a group home July 2018, also her daughter was diagnosed with cancer and it has been a struggle since. She has some crying spells. Her daughter is in remission and her granddaughter is doing better at the group home.    Patient Active Problem List   Diagnosis Date Noted  . Diabetic retinopathy ([place]) 11/12/2015  . Morbid obesity ([place]) 06/02/2015  . Osteoarthritis 06/02/2015  . Osteopenia 06/02/2015  . Narrowing of intervertebral disc space 06/02/2015  . Dyslipidemia associated with type 2 diabetes mellitus ([place]) 06/02/2015  . Perennial allergic rhinitis 02/28/2007  . Benign essential HTN 06/26/2006  . Pure hypercholesterolemia 06/26/2006    Past Surgical History:  Procedure Laterality Date  . ABDOMINAL HYSTERECTOMY      Family History  Problem Relation Age of Onset  . Hypertension Mother   . Healthy Father   . Healthy Daughter   . Raynaud syndrome Daughter   . Hypertension Daughter   . Stroke Sister     Social History   Socioeconomic History  . Marital status: Widowed    Spouse name: [name]  . Number of children: 2  . Years of education: Not on file  . Highest education level: Bachelor's degree (e.g., BA, AB, BS)  Occupational History  . Occupation: Retired  Scientific laboratory technician  . Financial resource strain: Not hard at all  . Food insecurity:    Worry: Never true    Inability:  Never true  . Transportation needs:    Medical: No    Non-medical: No  Tobacco Use  . Smoking status: Never Smoker  . Smokeless tobacco: Never Used  . Tobacco comment: smoking cessation materials not required  Substance and Sexual Activity  . Alcohol use: No    Alcohol/week: 0.0 oz  . Drug use: No  . Sexual activity: Not Currently  Lifestyle  . Physical activity:    Days per week: 3 days    Minutes per session: 40 min  . Stress: Not at all  Relationships  . Social connections:    Talks on phone: Patient refused    Gets  together: Patient refused    Attends religious service: Patient refused    Active member of club or organization: Patient refused    Attends meetings of clubs or organizations: Patient refused    Relationship status: Widowed  . Intimate partner violence:    Fear of current or ex partner: No    Emotionally abused: No    Physically abused: No    Forced sexual activity: No  Other Topics Concern  . Not on file  Social History Narrative  . Not on file     Current Outpatient Medications:  .  alendronate (FOSAMAX) 35 MG tablet, TAKE 1 TABLET BY MOUTH EVERY WEEK. TAKE WITH WATER ON EMPTY STOMACH, Disp: 12 tablet, Rfl: 1 .  aspirin 81 MG tablet, Take by mouth., Disp: , Rfl:  .  atorvastatin (LIPITOR) 40 MG tablet, Take 1 tablet (40 mg total) by mouth daily., Disp: 90 tablet, Rfl: 1 .  Cholecalciferol (VITAMIN D) 2000 units CAPS, Take 1 capsule (2,000 Units total) by mouth daily., Disp: 30 capsule, Rfl: 0 .  Cyanocobalamin (B-12) 1000 MCG SUBL, Place 1 tablet under the tongue daily., Disp: 100 each, Rfl: 2 .  furosemide (LASIX) 40 MG tablet, Take 1 tablet (40 mg total) by mouth daily as needed., Disp: 90 tablet, Rfl: 0 .  Glucosamine Sulfate 500 MG TABS, Take 1 tablet by mouth once a week. , Disp: , Rfl:  .  loratadine (CLARITIN) 10 MG tablet, Take 1 tablet (10 mg total) by mouth daily., Disp: 90 tablet, Rfl: 1 .  losartan-hydrochlorothiazide (HYZAAR) 50-12.5 MG tablet, Take 1 tablet by mouth daily., Disp: 90 tablet, Rfl: 3 .  metFORMIN (GLUCOPHAGE-XR) 750 MG 24 hr tablet, Take 2 tablets (1,500 mg total) by mouth daily with breakfast., Disp: 180 tablet, Rfl: 1 .  montelukast (SINGULAIR) 10 MG tablet, Take 1 tablet (10 mg total) by mouth every evening., Disp: 90 tablet, Rfl: 1 .  Multiple Vitamin (MULTI-VITAMINS) TABS, Take by mouth., Disp: , Rfl:  .  Potassium Chloride ER 20 MEQ TBCR, Take 10 mEq by mouth daily as needed. With lasix, Disp: , Rfl:  .  repaglinide (PRANDIN) 2 MG tablet, Take 1  tablet (2 mg total) by mouth 3 (three) times daily., Disp: 270 tablet, Rfl: 1 .  Zinc Acetate 50 MG CAPS, Take by mouth daily. , Disp: , Rfl:   Allergies  Allergen Reactions  . Lisinopril      ROS  Constitutional: Negative for fever, positive for  weight change - 18 lbs in the past year .  Respiratory: Negative for cough and shortness of breath.   Cardiovascular: Negative for chest pain or palpitations.  Gastrointestinal: Negative for abdominal pain, no bowel changes.  Musculoskeletal: Negative for gait problem or joint swelling.  Skin: Negative for rash.  Neurological: Negative for dizziness or headache.  No  other specific complaints in a complete review of systems (except as listed in HPI above).  Objective  Vitals:   06/23/17 1043  BP: 130/70  Pulse: 77  Resp: 16  SpO2: 97%  Weight: 205 lb 14.4 oz (93.4 kg)  Height: 5\' 5"  (1.651 m)    Body mass index is 34.26 kg/m.  Physical Exam  Constitutional: Patient appears well-developed and well-nourished. Obese  No distress.  HEENT: head atraumatic, normocephalic, pupils equal and reactive to light,  neck supple, throat within normal limits Cardiovascular: Normal rate, regular rhythm and normal heart sounds.  No murmur heard. Non pitting  BLE edema. Pulmonary/Chest: Effort normal and breath sounds normal. No respiratory distress. Abdominal: Soft.  There is no tenderness. Psychiatric: Patient has a normal mood and affect. behavior is normal. Judgment and thought content normal.  Diabetic Foot Exam: Diabetic Foot Exam - Simple   Simple Foot Form Diabetic Foot exam was performed with the following findings:  Yes 06/23/2017 11:14 AM  Visual Inspection See comments:  Yes Sensation Testing Intact to touch and monofilament testing bilaterally:  Yes Pulse Check Posterior Tibialis and Dorsalis pulse intact bilaterally:  Yes Comments Hammer toes, thick nails, callus formation       PHQ2/9: Depression screen [facility] 2/9  03/28/2017 03/28/2017 06/14/2016 04/11/2016 02/09/2016  Decreased Interest 0 0 0 0 0  Down, Depressed, Hopeless 0 0 0 0 0  PHQ - 2 Score 0 0 0 0 0  Altered sleeping 0 - - - -  Tired, decreased energy 0 - - - -  Change in appetite 0 - - - -  Feeling bad or failure about yourself  0 - - - -  Trouble concentrating 0 - - - -  Moving slowly or fidgety/restless 0 - - - -  Suicidal thoughts 0 - - - -  PHQ-9 Score 0 - - - -  Difficult doing work/chores Not difficult at all - - - -     Fall Risk: Fall Risk  06/23/2017 03/28/2017 02/23/2017 10/14/2016 06/14/2016  Falls in the past year? No No No No No  Risk for fall due to : - History of fall(s);Impaired vision - - -  Risk for fall due to: Comment - fell on R knee at gas station - - -     Functional Status Survey: Is the patient deaf or have difficulty hearing?: No Does the patient have difficulty seeing, even when wearing glasses/contacts?: No Does the patient have difficulty concentrating, remembering, or making decisions?: No Does the patient have difficulty walking or climbing stairs?: No Does the patient have difficulty dressing or bathing?: No Does the patient have difficulty doing errands alone such as visiting a doctor's office or shopping?: No   Assessment & Plan  1. 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  Dispense: 180 tablet; Refill: 1 - repaglinide (PRANDIN) 2 MG tablet; Take 1 tablet (2 mg total) by mouth 3 (three) times daily.  Dispense: 270 tablet; Refill: 1  2. Morbid obesity ([place])  Discussed with the patient the risk posed by an increased BMI. Discussed importance of portion control, calorie counting and at least 150 minutes of physical activity weekly. Avoid sweet beverages and drink more water. Eat at least 6  servings of fruit and vegetables daily   3. Benign  essential HTN  - COMPLETE METABOLIC PANEL WITH GFR - CBC with Differential/Platelet  4. Dyslipidemia  - Lipid panel - atorvastatin (LIPITOR) 40 MG tablet; Take 1 tablet (40 mg total) by mouth daily.  Dispense: 90 tablet; Refill: 1  5. B12 deficiency  - Vitamin B12  6. Osteopenia, unspecified location  - VITAMIN D 25 Hydroxy (Vit-D Deficiency, Fractures)  7. Long-term use of high-risk medication   8. Unintended weight loss  She was in a lot of stress over the past year, 18 lbs. Daughter diagnosed with cancer and granddaughter had to go to a group home - TSH - C-reactive protein - Sedimentation rate  9. Dyslipidemia associated with type 2 diabetes mellitus (HCC)  - atorvastatin (LIPITOR) 40 MG tablet; Take 1 tablet (40 mg total) by mouth daily.  Dispense: 90 tablet; Refill: 1 - metFORMIN (GLUCOPHAGE-XR) 750 MG 24 hr tablet; Take 2 tablets (1,500 mg total) by mouth daily with breakfast.  Dispense: 180 tablet; Refill: 1 - repaglinide (PRANDIN) 2 MG tablet; Take 1 tablet (2 mg total) by mouth 3 (three) times daily.  Dispense: 270 tablet; Refill: 1  10. Perennial allergic rhinitis  - montelukast (SINGULAIR) 10 MG tablet; Take 1 tablet (10 mg total) by mouth every evening.  Dispense: 90 tablet; Refill: 1

## 2017-06-24 LAB — LIPID PANEL
CHOLESTEROL: 108 mg/dL (ref <200)
HDL: 45 mg/dL — AB (ref >50)
LDL Cholesterol (Calc): 49 mg/dL (calc)
NON-HDL CHOLESTEROL (CALC): 63 mg/dL (ref <130)
Total CHOL/HDL Ratio: 2.4 (calc) (ref <5.0)
Triglycerides: 56 mg/dL (ref <150)

## 2017-06-24 LAB — COMPLETE METABOLIC PANEL WITH GFR
AG RATIO: 1.4 (calc) (ref 1.0–2.5)
ALBUMIN MSPROF: 4 g/dL (ref 3.6–5.1)
ALKALINE PHOSPHATASE (APISO): 62 U/L (ref 33–130)
ALT: 9 U/L (ref 6–29)
AST: 17 U/L (ref 10–35)
BILIRUBIN TOTAL: 0.5 mg/dL (ref 0.2–1.2)
BUN: 14 mg/dL (ref 7–25)
CHLORIDE: 103 mmol/L (ref 98–110)
CO2: 30 mmol/L (ref 20–32)
Calcium: 9.4 mg/dL (ref 8.6–10.4)
Creat: 0.83 mg/dL (ref 0.60–0.88)
GFR, EST AFRICAN AMERICAN: 77 mL/min/{1.73_m2} (ref >=60)
GFR, Est Non African American: 66 mL/min/{1.73_m2} (ref >=60)
Globulin: 2.8 g/dL (calc) (ref 1.9–3.7)
Glucose, Bld: 96 mg/dL (ref 65–99)
POTASSIUM: 3.9 mmol/L (ref 3.5–5.3)
SODIUM: 141 mmol/L (ref 135–146)
Total Protein: 6.8 g/dL (ref 6.1–8.1)

## 2017-06-24 LAB — CBC WITH DIFFERENTIAL/PLATELET
Basophils Absolute: 18 cells/uL (ref 0–200)
Basophils Relative: 0.3 %
EOS PCT: 1.7 %
Eosinophils Absolute: 100 cells/uL (ref 15–500)
HEMATOCRIT: 33.5 % — AB (ref 35.0–45.0)
Hemoglobin: 11.3 g/dL — ABNORMAL LOW (ref 11.7–15.5)
Lymphs Abs: 3263 cells/uL (ref 850–3900)
MCH: 30.8 pg (ref 27.0–33.0)
MCHC: 33.7 g/dL (ref 32.0–36.0)
MCV: 91.3 fL (ref 80.0–100.0)
MONOS PCT: 6.1 %
MPV: 12 fL (ref 7.5–12.5)
NEUTROS PCT: 36.6 %
Neutro Abs: 2159 cells/uL (ref 1500–7800)
PLATELETS: 229 10*3/uL (ref 140–400)
RBC: 3.67 10*6/uL — ABNORMAL LOW (ref 3.80–5.10)
RDW: 13.7 % (ref 11.0–15.0)
TOTAL LYMPHOCYTE: 55.3 %
WBC mixed population: 360 cells/uL (ref 200–950)
WBC: 5.9 10*3/uL (ref 3.8–10.8)

## 2017-06-24 LAB — VITAMIN B12: Vitamin B-12: 1330 pg/mL — ABNORMAL HIGH (ref 200–1100)

## 2017-06-24 LAB — SEDIMENTATION RATE: SED RATE: 48 mm/h — AB (ref 0–30)

## 2017-06-24 LAB — TSH: TSH: 1.17 mIU/L (ref 0.40–4.50)

## 2017-06-24 LAB — C-REACTIVE PROTEIN: CRP: 0.9 mg/L (ref <8.0)

## 2017-07-19 ENCOUNTER — Encounter: Payer: Self-pay | Admitting: Family Medicine

## 2017-07-20 ENCOUNTER — Encounter: Payer: Self-pay | Admitting: Family Medicine

## 2017-07-26 ENCOUNTER — Other Ambulatory Visit: Payer: Self-pay

## 2017-07-26 NOTE — Telephone Encounter (Signed)
We got a fax from [facility] stating that they were out of the combo pill Losartan/HCTZ  50-12.5mg  and that they would new rx for each medication separately, but it also had a note stating that the patient has #[phone] remaining [facility] on [facility].   I then called to verify this information with the patient and was informed that she picked up her RX from [facility] on yesterday and did not need anything from [place]. I said ok.

## 2017-08-26 ENCOUNTER — Other Ambulatory Visit: Payer: Self-pay | Admitting: Family Medicine

## 2017-08-26 DIAGNOSIS — M8580 Other specified disorders of bone density and structure, unspecified site: Secondary | ICD-10-CM

## 2017-10-24 ENCOUNTER — Ambulatory Visit: Payer: Medicare Other | Admitting: Family Medicine

## 2017-10-24 ENCOUNTER — Encounter: Payer: Self-pay | Admitting: Family Medicine

## 2017-10-24 VITALS — BP 116/58 | HR 92 | Temp 97.8°F | Resp 16 | Ht 65.0 in | Wt 207.0 lb

## 2017-10-24 DIAGNOSIS — L84 Corns and callosities: Secondary | ICD-10-CM

## 2017-10-24 DIAGNOSIS — J3089 Other allergic rhinitis: Secondary | ICD-10-CM

## 2017-10-24 DIAGNOSIS — E1169 Type 2 diabetes mellitus with other specified complication: Secondary | ICD-10-CM | POA: Diagnosis not present

## 2017-10-24 DIAGNOSIS — E11628 Type 2 diabetes mellitus with other skin complications: Secondary | ICD-10-CM

## 2017-10-24 DIAGNOSIS — E1129 Type 2 diabetes mellitus with other diabetic kidney complication: Secondary | ICD-10-CM | POA: Insufficient documentation

## 2017-10-24 DIAGNOSIS — I10 Essential (primary) hypertension: Secondary | ICD-10-CM

## 2017-10-24 DIAGNOSIS — E113553 Type 2 diabetes mellitus with stable proliferative diabetic retinopathy, bilateral: Secondary | ICD-10-CM | POA: Diagnosis not present

## 2017-10-24 DIAGNOSIS — D649 Anemia, unspecified: Secondary | ICD-10-CM

## 2017-10-24 DIAGNOSIS — M8580 Other specified disorders of bone density and structure, unspecified site: Secondary | ICD-10-CM

## 2017-10-24 DIAGNOSIS — R700 Elevated erythrocyte sedimentation rate: Secondary | ICD-10-CM

## 2017-10-24 DIAGNOSIS — E785 Hyperlipidemia, unspecified: Secondary | ICD-10-CM

## 2017-10-24 LAB — POCT GLYCOSYLATED HEMOGLOBIN (HGB A1C): HbA1c, POC (controlled diabetic range): 7.1 % — AB (ref 0.0–7.0)

## 2017-10-24 MED ORDER — LOSARTAN POTASSIUM 50 MG PO TABS: 50.0000 mg | ORAL_TABLET | Freq: Every day | ORAL | 0 refills | Status: DC

## 2017-10-24 MED ORDER — ATORVASTATIN CALCIUM 40 MG PO TABS: 40.0000 mg | ORAL_TABLET | Freq: Every day | ORAL | 1 refills | Status: DC

## 2017-10-24 MED ORDER — METFORMIN HCL ER 750 MG PO TB24: 1500.0000 mg | ORAL_TABLET | Freq: Every day | ORAL | 1 refills | Status: DC

## 2017-10-24 MED ORDER — ALENDRONATE SODIUM 35 MG PO TABS: ORAL_TABLET | ORAL | 1 refills | Status: DC

## 2017-10-24 NOTE — Progress Notes (Signed)
Name: Beverly [name]   MRN: [id number]    DOB: [birth year]   Date:10/24/2017       Progress Note  Subjective  Chief Complaint  Chief Complaint  Patient presents with  . Medication Refill    4 month F/U  . Diabetes    Checks every other day- Lowest-95 Average-150   . Hypertension    Denies any symptoms-Just edema in the summer time  . Allergic Rhinitis   . Obesity  . Osteoarthritis    HPI  DMII: she has been taking Metformin to 1500 mg daily,takingPrandin before breakfast and dinner ( usually eats a very light lunch). Fasting 95 on average post- prandiallybelow 150 Eye exam is up to date She denies polyphagia or polydipsia but she has nocturia - twice per night - stable. No side effects of medication, no recent hypoglycemic episodes. No blurred vision. HgbA1C was 7.6%,to 8.5% down to 6.8% up to8.2%, today is 8.4%, down to 6.7%,  7.2% and today is 7.1% She is compliant with medications.   HTN:BP is towards low end of normal, no chest pain, no palpitation or dizziness, but because of her age we will stop hctz. She states bp at home also in the 115's range. . Taking medication as prescribed, no side effects. Takes Furosemide prn only for severe swelling, discussed compression stocking hoses to control swelling - worse during the Summer.  Hyperlipidemia: taking Atorvastatin, no myalgias.reviewed last labs with patient   AR: well controlled, taking singulair and is now off Benadryl and taking Loratadine daily. She is doing well at this time, denies rhinorrhea, sneezing or nasal congestion Unchanged    Obesity but with recent weight loss: she had lost 18 lbs in the past year, the only change was eliminating dry fruit. Discussed that since not much of a change it could be cancer or depression. However since last visit weight has been stable. She is going to the gym 3 days a week and walks the other days. Sed rate was up but normal CRP, she was found to have mild anemia and we  will recheck labs today   OA: she has right knee pain, it started after a fall 34years ago, she takes arthritis pain pill otc Pain is described as dull and aching, intermittent but is doing well on tylenol , we will recheck sed rate and CRP  Unintentional weight loss: she had lost 18 lbs in the past year, she states she is not as hungry. She states under a lot of stress since last Summer. Her granddaughter ( father absent and her mother died when she was a child) had to be sent a group home July 2018, also her daughter was diagnosed with uterine cancer, but now doing well.    Patient Active Problem List   Diagnosis Date Noted  . Diabetic retinopathy ([place]) 11/12/2015  . Morbid obesity ([place]) 06/02/2015  . Osteoarthritis 06/02/2015  . Osteopenia 06/02/2015  . Narrowing of intervertebral disc space 06/02/2015  . Dyslipidemia associated with type 2 diabetes mellitus ([place]) 06/02/2015  . Perennial allergic rhinitis 02/28/2007  . Benign essential HTN 06/26/2006  . Pure hypercholesterolemia 06/26/2006    Past Surgical History:  Procedure Laterality Date  . ABDOMINAL HYSTERECTOMY      Family History  Problem Relation Age of Onset  . Hypertension Mother   . Healthy Father   . Healthy Daughter   . Raynaud syndrome Daughter   . Hypertension Daughter   . Stroke Sister     Social History  Socioeconomic History  . Marital status: Widowed    Spouse name: [name]  . Number of children: 2  . Years of education: Not on file  . Highest education level: Bachelor's degree (e.g., BA, AB, BS)  Occupational History  . Occupation: Retired  Scientific laboratory technician  . Financial resource strain: Not hard at all  . Food insecurity:    Worry: Never true    Inability: Never true  . Transportation needs:    Medical: No    Non-medical: No  Tobacco Use  . Smoking status: Never Smoker  . Smokeless tobacco: Never Used  . Tobacco comment: smoking cessation materials not required  Substance and Sexual  Activity  . Alcohol use: No    Alcohol/week: 0.0 standard drinks  . Drug use: No  . Sexual activity: Not Currently  Lifestyle  . Physical activity:    Days per week: 3 days    Minutes per session: 40 min  . Stress: Not at all  Relationships  . Social connections:    Talks on phone: Patient refused    Gets together: Patient refused    Attends religious service: Patient refused    Active member of club or organization: Patient refused    Attends meetings of clubs or organizations: Patient refused    Relationship status: Widowed  . Intimate partner violence:    Fear of current or ex partner: No    Emotionally abused: No    Physically abused: No    Forced sexual activity: No  Other Topics Concern  . Not on file  Social History Narrative  . Not on file     Current Outpatient Medications:  .  alendronate (FOSAMAX) 35 MG tablet, Take with a full glass of water on an empty stomach., Disp: 12 tablet, Rfl: 1 .  aspirin 81 MG tablet, Take by mouth., Disp: , Rfl:  .  atorvastatin (LIPITOR) 40 MG tablet, Take 1 tablet (40 mg total) by mouth daily., Disp: 90 tablet, Rfl: 1 .  Cholecalciferol (VITAMIN D) 2000 units CAPS, Take 1 capsule (2,000 Units total) by mouth daily., Disp: 30 capsule, Rfl: 0 .  furosemide (LASIX) 40 MG tablet, Take 1 tablet (40 mg total) by mouth daily as needed., Disp: 90 tablet, Rfl: 0 .  Glucosamine Sulfate 500 MG TABS, Take 1 tablet by mouth once a week. , Disp: , Rfl:  .  loratadine (CLARITIN) 10 MG tablet, Take 1 tablet (10 mg total) by mouth daily., Disp: 90 tablet, Rfl: 1 .  metFORMIN (GLUCOPHAGE-XR) 750 MG 24 hr tablet, Take 2 tablets (1,500 mg total) by mouth daily with breakfast., Disp: 180 tablet, Rfl: 1 .  montelukast (SINGULAIR) 10 MG tablet, Take 1 tablet (10 mg total) by mouth every evening., Disp: 90 tablet, Rfl: 1 .  Multiple Vitamin (MULTI-VITAMINS) TABS, Take by mouth., Disp: , Rfl:  .  Potassium Chloride ER 20 MEQ TBCR, Take 10 mEq by mouth daily  as needed. With lasix, Disp: , Rfl:  .  repaglinide (PRANDIN) 2 MG tablet, Take 1 tablet (2 mg total) by mouth 3 (three) times daily., Disp: 270 tablet, Rfl: 1 .  Zinc Acetate 50 MG CAPS, Take by mouth daily. , Disp: , Rfl:  .  Cyanocobalamin (B-12) 1000 MCG SUBL, Place 1 tablet under the tongue daily. (Patient not taking: Reported on 10/24/2017), Disp: 100 each, Rfl: 2 .  losartan (COZAAR) 50 MG tablet, Take 1 tablet (50 mg total) by mouth daily. New medication, Disp: 90 tablet, Rfl: 0  Allergies  Allergen Reactions  . Lisinopril     I personally reviewed active problem list, medication list, allergies, family history, social history with the patient/caregiver today.   ROS  Constitutional: Negative for fever or weight change.  Respiratory: Negative for cough and shortness of breath.   Cardiovascular: Negative for chest pain or palpitations.  Gastrointestinal: Negative for abdominal pain, no bowel changes.  Musculoskeletal: Negative for gait problem or joint swelling.  Skin: Negative for rash.  Neurological: Negative for dizziness or headache.  No other specific complaints in a complete review of systems (except as listed in HPI above).  Objective  Vitals:   10/24/17 1104  BP: (!) 116/58  Pulse: 92  Resp: 16  Temp: 97.8 F (36.6 C)  TempSrc: Oral  SpO2: 99%  Weight: 207 lb (93.9 kg)  Height: 5\' 5"  (1.651 m)    Body mass index is 34.45 kg/m.  Physical Exam  Constitutional: Patient appears well-developed and well-nourished. Obese  No distress.  HEENT: head atraumatic, normocephalic, pupils equal and reactive to light, neck supple, throat within normal limits Cardiovascular: Normal rate, regular rhythm and normal heart sounds.  No murmur heard. 1 plus  BLE edema. Pulmonary/Chest: Effort normal and breath sounds normal. No respiratory distress. Abdominal: Soft.  There is no tenderness. Psychiatric: Patient has a normal mood and affect. behavior is normal. Judgment and  thought content normal.  Recent Results (from the past 2160 hour(s))  POCT HgB A1C     Status: Abnormal   Collection Time: 10/24/17 11:07 AM  Result Value Ref Range   Hemoglobin A1C     HbA1c POC (<> result, manual entry)     HbA1c, POC (prediabetic range)     HbA1c, POC (controlled diabetic range) 7.1 (A) 0.0 - 7.0 %    Diabetic Foot Exam: Diabetic Foot Exam - Simple   Simple Foot Form Diabetic Foot exam was performed with the following findings:  Yes 10/24/2017 12:00 PM  Visual Inspection See comments:  Yes Sensation Testing Intact to touch and monofilament testing bilaterally:  Yes Pulse Check Posterior Tibialis and Dorsalis pulse intact bilaterally:  Yes Comments Thick toenails, hammer toes, callus formation      PHQ2/9: Depression screen [facility] 2/9 10/24/2017 03/28/2017 03/28/2017 06/14/2016 04/11/2016  Decreased Interest 2 0 0 0 0  Down, Depressed, Hopeless 0 0 0 0 0  PHQ - 2 Score 2 0 0 0 0  Altered sleeping 1 0 - - -  Tired, decreased energy 1 0 - - -  Change in appetite 0 0 - - -  Feeling bad or failure about yourself  0 0 - - -  Trouble concentrating 0 0 - - -  Moving slowly or fidgety/restless 0 0 - - -  Suicidal thoughts 0 0 - - -  PHQ-9 Score 4 0 - - -  Difficult doing work/chores Not difficult at all Not difficult at all - - -     Fall Risk: Fall Risk  10/24/2017 06/23/2017 03/28/2017 02/23/2017 10/14/2016  Falls in the past year? No No No No No  Risk for fall due to : - - History of fall(s);Impaired vision - -  Risk for fall due to: Comment - - fell on R knee at gas station - -     Functional Status Survey: Is the patient deaf or have difficulty hearing?: No Does the patient have difficulty seeing, even when wearing glasses/contacts?: No Does the patient have difficulty concentrating, remembering, or making decisions?: No Does the patient  have difficulty walking or climbing stairs?: Yes Does the patient have difficulty dressing or bathing?: No Does the  patient have difficulty doing errands alone such as visiting a doctor's office or shopping?: No    Assessment & Plan  1. 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 ratio - losartan (COZAAR) 50 MG tablet; Take 1 tablet (50 mg total) by mouth daily. New medication  Dispense: 90 tablet; Refill: 0 - metFORMIN (GLUCOPHAGE-XR) 750 MG 24 hr tablet; Take 2 tablets (1,500 mg total) by mouth daily with breakfast.  Dispense: 180 tablet; Refill: 1  2. Benign essential HTN  Stop HCTZ bp is low  - losartan (COZAAR) 50 MG tablet; Take 1 tablet (50 mg total) by mouth daily. New medication  Dispense: 90 tablet; Refill: 0  3. Dyslipidemia associated with type 2 diabetes mellitus (HCC)  - metFORMIN (GLUCOPHAGE-XR) 750 MG 24 hr tablet; Take 2 tablets (1,500 mg total) by mouth daily with breakfast.  Dispense: 180 tablet; Refill: 1 - atorvastatin (LIPITOR) 40 MG tablet; Take 1 tablet (40 mg total) by mouth daily.  Dispense: 90 tablet; Refill: 1  4. Perennial allergic rhinitis   5. Dyslipidemia  - atorvastatin (LIPITOR) 40 MG tablet; Take 1 tablet (40 mg total) by mouth daily.  Dispense: 90 tablet; Refill: 1  6. Osteopenia, unspecified location  - alendronate (FOSAMAX) 35 MG tablet; Take with a full glass of water on an empty stomach.  Dispense: 12 tablet; Refill: 1   9. Type 2 diabetes mellitus with pressure callus (HCC)

## 2017-10-25 LAB — IRON,TIBC AND FERRITIN PANEL
%SAT: 23 % (ref 16–45)
Ferritin: 20 ng/mL (ref 16–288)
Iron: 64 ug/dL (ref 45–160)
TIBC: 282 ug/dL (ref 250–450)

## 2017-10-25 LAB — CBC WITH DIFFERENTIAL/PLATELET
Basophils Absolute: 20 cells/uL (ref 0–200)
Basophils Relative: 0.3 %
EOS PCT: 1.6 %
Eosinophils Absolute: 104 cells/uL (ref 15–500)
HEMATOCRIT: 34 % — AB (ref 35.0–45.0)
Hemoglobin: 11.3 g/dL — ABNORMAL LOW (ref 11.7–15.5)
LYMPHS ABS: 3257 {cells}/uL (ref 850–3900)
MCH: 31 pg (ref 27.0–33.0)
MCHC: 33.2 g/dL (ref 32.0–36.0)
MCV: 93.2 fL (ref 80.0–100.0)
MONOS PCT: 5.3 %
MPV: 12.2 fL (ref 7.5–12.5)
NEUTROS PCT: 42.7 %
Neutro Abs: 2776 cells/uL (ref 1500–7800)
PLATELETS: 221 10*3/uL (ref 140–400)
RBC: 3.65 10*6/uL — AB (ref 3.80–5.10)
RDW: 13.5 % (ref 11.0–15.0)
TOTAL LYMPHOCYTE: 50.1 %
WBC mixed population: 345 cells/uL (ref 200–950)
WBC: 6.5 10*3/uL (ref 3.8–10.8)

## 2017-10-25 LAB — MICROALBUMIN / CREATININE URINE RATIO
Creatinine, Urine: 418 mg/dL — ABNORMAL HIGH (ref 20–275)
Microalb Creat Ratio: 6 mcg/mg creat (ref <30)
Microalb, Ur: 2.6 mg/dL

## 2017-10-25 LAB — SEDIMENTATION RATE: Sed Rate: 43 mm/h — ABNORMAL HIGH (ref 0–30)

## 2017-10-25 LAB — C-REACTIVE PROTEIN: CRP: 0.7 mg/L (ref <8.0)

## 2017-11-01 ENCOUNTER — Other Ambulatory Visit: Payer: Self-pay | Admitting: Family Medicine

## 2017-11-01 DIAGNOSIS — I10 Essential (primary) hypertension: Secondary | ICD-10-CM

## 2017-11-01 DIAGNOSIS — L84 Corns and callosities: Secondary | ICD-10-CM

## 2017-11-01 DIAGNOSIS — E11628 Type 2 diabetes mellitus with other skin complications: Secondary | ICD-10-CM

## 2017-11-01 DIAGNOSIS — R7989 Other specified abnormal findings of blood chemistry: Secondary | ICD-10-CM

## 2017-11-20 ENCOUNTER — Ambulatory Visit: Payer: Medicare Other | Admitting: Family Medicine

## 2017-11-20 ENCOUNTER — Encounter: Payer: Self-pay | Admitting: Family Medicine

## 2017-11-20 VITALS — BP 138/82 | HR 100 | Temp 98.2°F | Resp 16 | Ht 65.0 in | Wt 205.7 lb

## 2017-11-20 DIAGNOSIS — N3001 Acute cystitis with hematuria: Secondary | ICD-10-CM

## 2017-11-20 NOTE — Progress Notes (Signed)
Name: Beverly [name]   MRN: [id number]    DOB: 1934-06-14   Date:11/20/2017       Progress Note  Subjective  Chief Complaint  Chief Complaint  Patient presents with  . Follow-up  . Abdominal Pain    seen at [facility] left side pain     HPI  Pt presents to follow up on LEFT LQ abdominal pain. Was seen at [facility] on 11/12/2017 and was told she had a UTI.  She took antibiotic, finished 2 days ago, and is feeling better, LLQ pain and back pain have improved significantly, urine is now pale yellow.  She denies dysuria, hematuria, nausea, or vomiting, fevers/chills, or body aches.  Her UA from [facility] showed +blood, protein, leukocytes, ketones, and nitrates.  We will recheck 2 weeks after Abx completion to ensure resolution.  Patient Active Problem List   Diagnosis Date Noted  . Type 2 diabetes mellitus with pressure callus ([place]) 10/24/2017  . Diabetic retinopathy ([place]) 11/12/2015  . Morbid obesity ([place]) 06/02/2015  . Osteoarthritis 06/02/2015  . Osteopenia 06/02/2015  . Narrowing of intervertebral disc space 06/02/2015  . Dyslipidemia associated with type 2 diabetes mellitus ([place]) 06/02/2015  . Perennial allergic rhinitis 02/28/2007  . Benign essential HTN 06/26/2006  . Pure hypercholesterolemia 06/26/2006    Social History   Tobacco Use  . Smoking status: Never Smoker  . Smokeless tobacco: Never Used  . Tobacco comment: smoking cessation materials not required  Substance Use Topics  . Alcohol use: No    Alcohol/week: 0.0 standard drinks     Current Outpatient Medications:  .  alendronate (FOSAMAX) 35 MG tablet, Take with a full glass of water on an empty stomach., Disp: 12 tablet, Rfl: 1 .  aspirin 81 MG tablet, Take by mouth., Disp: , Rfl:  .  atorvastatin (LIPITOR) 40 MG tablet, Take 1 tablet (40 mg total) by mouth daily., Disp: 90 tablet, Rfl: 1 .  Cholecalciferol (VITAMIN D) 2000 units CAPS, Take 1 capsule (2,000 Units total) by mouth daily., Disp: 30 capsule, Rfl:  0 .  furosemide (LASIX) 40 MG tablet, Take 1 tablet (40 mg total) by mouth daily as needed., Disp: 90 tablet, Rfl: 0 .  Glucosamine Sulfate 500 MG TABS, Take 1 tablet by mouth once a week. , Disp: , Rfl:  .  loratadine (CLARITIN) 10 MG tablet, Take 1 tablet (10 mg total) by mouth daily., Disp: 90 tablet, Rfl: 1 .  losartan (COZAAR) 50 MG tablet, Take 1 tablet (50 mg total) by mouth daily. New medication, Disp: 90 tablet, Rfl: 0 .  metFORMIN (GLUCOPHAGE-XR) 750 MG 24 hr tablet, Take 2 tablets (1,500 mg total) by mouth daily with breakfast., Disp: 180 tablet, Rfl: 1 .  montelukast (SINGULAIR) 10 MG tablet, Take 1 tablet (10 mg total) by mouth every evening., Disp: 90 tablet, Rfl: 1 .  Multiple Vitamin (MULTI-VITAMINS) TABS, Take by mouth., Disp: , Rfl:  .  Potassium Chloride ER 20 MEQ TBCR, Take 10 mEq by mouth daily as needed. With lasix, Disp: , Rfl:  .  repaglinide (PRANDIN) 2 MG tablet, Take 1 tablet (2 mg total) by mouth 3 (three) times daily., Disp: 270 tablet, Rfl: 1 .  Zinc Acetate 50 MG CAPS, Take by mouth daily. , Disp: , Rfl:  .  Cyanocobalamin (B-12) 1000 MCG SUBL, Place 1 tablet under the tongue daily. (Patient not taking: Reported on 10/24/2017), Disp: 100 each, Rfl: 2  Allergies  Allergen Reactions  . Lisinopril  I personally reviewed active problem list, medication list, notes from last encounter, lab results with the patient/caregiver today.  ROS  Constitutional: Negative for fever or weight change.  Respiratory: Negative for cough and shortness of breath.   Cardiovascular: Negative for chest pain or palpitations.  Gastrointestinal: Negative for abdominal pain, no bowel changes.  Musculoskeletal: Negative for gait problem or joint swelling.  Skin: Negative for rash.  Neurological: Negative for dizziness or headache.  No other specific complaints in a complete review of systems (except as listed in HPI above).  Objective  Vitals:   11/20/17 1023  BP: 138/82   Pulse: 100  Resp: 16  Temp: 98.2 F (36.8 C)  TempSrc: Oral  SpO2: 96%  Weight: 205 lb 11.2 oz (93.3 kg)  Height: 5\' 5"  (1.651 m)    Body mass index is 34.23 kg/m.  Nursing Note and Vital Signs reviewed.  Physical Exam  Constitutional: Patient appears well-developed and well-nourished. Obese. No distress.  HEENT: head atraumatic, normocephalic, pupils equal and reactive to light,  neck supple without lymphadenopathy,  Cardiovascular: Normal rate, regular rhythm and normal heart sounds.  No murmur heard. No BLE edema. Pulmonary/Chest: Effort normal and breath sounds clear bilaterally. No respiratory distress. Abdominal: Soft, bowel sounds normal, there is no tenderness, no HSM. No CVA tenderness. Psychiatric: Patient has a normal mood and affect. behavior is normal. Judgment and thought content normal. Neurological: Pt is alert and oriented to person, place, and time. No cranial nerve deficit. Coordination, balance, strength, speech and gait are normal.  Skin: Skin is warm and dry. No rash noted. No erythema.   No results found for this or any previous visit (from the past 72 hour(s)).  Assessment & Plan  1. Acute cystitis with hematuria - symptoms appear to be resolving, she is feeling better and abdominal pain/back pain are significantly improved. I advised if she is not continuing to improve and/or if anything worsens to follow up this week. Otherwise repeat UA in 2 weeks to ensure resolution of hematuria.

## 2017-12-04 ENCOUNTER — Ambulatory Visit (INDEPENDENT_AMBULATORY_CARE_PROVIDER_SITE_OTHER): Payer: Medicare Other

## 2017-12-04 DIAGNOSIS — N3001 Acute cystitis with hematuria: Secondary | ICD-10-CM | POA: Diagnosis not present

## 2017-12-04 LAB — POCT URINALYSIS DIPSTICK
Bilirubin, UA: NEGATIVE
Glucose, UA: NEGATIVE
Ketones, UA: NEGATIVE
NITRITE UA: NEGATIVE
PROTEIN UA: POSITIVE — AB
Spec Grav, UA: >=1.03 — AB (ref 1.010–1.025)
Urobilinogen, UA: 0.2 E.U./dL
pH, UA: 5 (ref 5.0–8.0)

## 2017-12-04 NOTE — Progress Notes (Signed)
Patient came in for a repeat POC UA. She stated that she took all medications as directed.  Results were positive. Dr. Ancil Boozer was informed.  New orders were placed for a Urine Micro and CX.  Patient will be informed once the results has been completed.

## 2017-12-05 LAB — URINE CULTURE
MICRO NUMBER:: 91442327
SPECIMEN QUALITY:: ADEQUATE

## 2017-12-05 LAB — MICROALBUMIN / CREATININE URINE RATIO
Creatinine, Urine: 239 mg/dL (ref 20–275)
MICROALB/CREAT RATIO: 26 ug/mg{creat} (ref <30)
Microalb, Ur: 6.2 mg/dL

## 2017-12-21 LAB — HM DIABETES EYE EXAM

## 2018-01-09 ENCOUNTER — Other Ambulatory Visit: Payer: Self-pay | Admitting: Nephrology

## 2018-01-09 ENCOUNTER — Other Ambulatory Visit (HOSPITAL_COMMUNITY): Payer: Self-pay | Admitting: Nephrology

## 2018-01-09 DIAGNOSIS — R319 Hematuria, unspecified: Secondary | ICD-10-CM

## 2018-01-14 ENCOUNTER — Other Ambulatory Visit: Payer: Self-pay | Admitting: Family Medicine

## 2018-01-14 DIAGNOSIS — I10 Essential (primary) hypertension: Secondary | ICD-10-CM

## 2018-01-14 DIAGNOSIS — E113553 Type 2 diabetes mellitus with stable proliferative diabetic retinopathy, bilateral: Secondary | ICD-10-CM

## 2018-01-16 ENCOUNTER — Ambulatory Visit
Admission: RE | Admit: 2018-01-16 | Discharge: 2018-01-16 | Disposition: A | Discharge status: Home / Self Care | Payer: Medicare Other | Source: Ambulatory Visit | Attending: Nephrology | Admitting: Nephrology

## 2018-01-16 DIAGNOSIS — R319 Hematuria, unspecified: Secondary | ICD-10-CM

## 2018-01-24 ENCOUNTER — Ambulatory Visit: Payer: Medicare Other

## 2018-01-25 ENCOUNTER — Other Ambulatory Visit: Payer: Self-pay | Admitting: Nephrology

## 2018-01-25 DIAGNOSIS — R319 Hematuria, unspecified: Secondary | ICD-10-CM

## 2018-02-27 ENCOUNTER — Encounter: Payer: Self-pay | Admitting: Family Medicine

## 2018-02-27 ENCOUNTER — Ambulatory Visit: Payer: Medicare Other | Admitting: Family Medicine

## 2018-02-27 VITALS — BP 134/64 | HR 93 | Temp 98.0°F | Resp 16 | Ht 65.0 in | Wt 202.8 lb

## 2018-02-27 DIAGNOSIS — D649 Anemia, unspecified: Secondary | ICD-10-CM | POA: Diagnosis not present

## 2018-02-27 DIAGNOSIS — E113553 Type 2 diabetes mellitus with stable proliferative diabetic retinopathy, bilateral: Secondary | ICD-10-CM

## 2018-02-27 DIAGNOSIS — R700 Elevated erythrocyte sedimentation rate: Secondary | ICD-10-CM

## 2018-02-27 DIAGNOSIS — E785 Hyperlipidemia, unspecified: Secondary | ICD-10-CM

## 2018-02-27 DIAGNOSIS — I10 Essential (primary) hypertension: Secondary | ICD-10-CM | POA: Diagnosis not present

## 2018-02-27 DIAGNOSIS — E11628 Type 2 diabetes mellitus with other skin complications: Secondary | ICD-10-CM

## 2018-02-27 DIAGNOSIS — L84 Corns and callosities: Secondary | ICD-10-CM

## 2018-02-27 DIAGNOSIS — E1169 Type 2 diabetes mellitus with other specified complication: Secondary | ICD-10-CM

## 2018-02-27 DIAGNOSIS — E538 Deficiency of other specified B group vitamins: Secondary | ICD-10-CM

## 2018-02-27 LAB — POCT GLYCOSYLATED HEMOGLOBIN (HGB A1C): HbA1c, POC (controlled diabetic range): 6.8 % (ref 0.0–7.0)

## 2018-02-27 MED ORDER — REPAGLINIDE 2 MG PO TABS: 2.0000 mg | ORAL_TABLET | Freq: Three times a day (TID) | ORAL | 1 refills | Status: DC

## 2018-02-27 NOTE — Progress Notes (Signed)
Name: Beverly [name]   MRN: [id number]    DOB: 07/24/1934   Date:02/27/2018       Progress Note  Subjective  Chief Complaint  Chief Complaint  Patient presents with  . Medication Refill  . Diabetes    Fasting BS Average 95-105 After meals average -150  . Hypertension    Denies any symptoms  . Hyperlipidemia  . Allergic Rhinitis     Medication controls symptoms  . Osteoarthritis  . Obesity    Watching portion control-has lost 4 more pounds since last visit-states she was under a lot of stress with her daughter having stage 4 cancer    HPI   DMII: she has been taking Metformin to 1500 mg daily,takingPrandin before breakfast and dinner ( usually eats a very light lunch). Fasting 95 -105  post- prandially150 's Eye exam is up to date She denies polyphagia or polydipsia but she has nocturia - twice per night - stable. No side effects of medication, no recent hypoglycemic episodes. No blurred vision. HgbA1C was 7.6%,to 8.5% down to 6.8% up to8.2%, today is 8.4%, down to 6.7%,  7.2% , 7.1% and down to 6.8%   HTN:BP is at goal today,  no chest pain, no palpitation or dizziness, she is off HCTZ . Taking medication as prescribed, no side effects. Takes Furosemide prn only for severe swelling, discussed compression stocking hoses to control swelling -worse during the Summer.   Hyperlipidemia: taking Atorvastatin, no myalgias. Unchanged   AR: well controlled, taking medication prn and is doing well at this time, no rhinorrhea or nasal congestion at this time  OA: she has right knee pain, it started after a fall 34years ago, she takes arthritis pain pill otc Pain is described as dull and aching, intermittent but is doing well on tylenol. Seeing chiropractor - Dr. Freddi Che and is doing better   Unintentional weight loss: she had lost 18 lbs in the previous year.  She states under a lot of stress since Summer of 2018 worried about grand-daughter  also her daughter was diagnosed  with uterine cancer. Since she lost weight, she got motivated to eat healthier and very seldom having dessert. She is now down 4 lbs since last visit, but since last Feb she is down 11 lbs, Slower weight loss. She had elevated sed rate, low white count and anemia. We will recheck labs and consider referral to hematologist   Patient Active Problem List   Diagnosis Date Noted  . 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 Type 2 diabetes mellitus with pressure callus ([place]) 10/24/2017  . Diabetic retinopathy ([place]) 11/12/2015  . Obesity (BMI 30.0-34.9) 06/02/2015  . Osteoarthritis 06/02/2015  . Osteopenia 06/02/2015  . Narrowing of intervertebral disc space 06/02/2015  . Dyslipidemia associated with type 2 diabetes mellitus ([place]) 06/02/2015  . Perennial allergic rhinitis 02/28/2007  . Benign essential HTN 06/26/2006  . Pure hypercholesterolemia 06/26/2006    Past Surgical History:  Procedure Laterality Date  . ABDOMINAL HYSTERECTOMY      Family History  Problem Relation Age of Onset  . Hypertension Mother   . Healthy Father   . Healthy Daughter   . Raynaud syndrome Daughter   . Hypertension Daughter   . Stroke Sister     Social History   Socioeconomic History  . Marital status: Widowed    Spouse name: [name]  . Number of children: 2  . Years of  education: Not on file  . Highest education level: Bachelor's degree (e.g., BA, AB, BS)  Occupational History  . Occupation: Retired  Scientific laboratory technician  . Financial resource strain: Not hard at all  . Food insecurity:    Worry: Never true    Inability: Never true  . Transportation needs:    Medical: No    Non-medical: No  Tobacco Use  . Smoking status: Never Smoker  . Smokeless tobacco: Never Used  . Tobacco comment: smoking cessation materials not required  Substance and Sexual Activity  . Alcohol use: No    Alcohol/week: 0.0 standard  drinks  . Drug use: No  . Sexual activity: Not Currently  Lifestyle  . Physical activity:    Days per week: 3 days    Minutes per session: 40 min  . Stress: Not at all  Relationships  . Social connections:    Talks on phone: Patient refused    Gets together: Patient refused    Attends religious service: Patient refused    Active member of club or organization: Patient refused    Attends meetings of clubs or organizations: Patient refused    Relationship status: Widowed  . Intimate partner violence:    Fear of current or ex partner: No    Emotionally abused: No    Physically abused: No    Forced sexual activity: No  Other Topics Concern  . Not on file  Social History Narrative  . Not on file     Current Outpatient Medications:  .  alendronate (FOSAMAX) 35 MG tablet, Take with a full glass of water on an empty stomach., Disp: 12 tablet, Rfl: 1 .  aspirin 81 MG tablet, Take by mouth., Disp: , Rfl:  .  atorvastatin (LIPITOR) 40 MG tablet, Take 1 tablet (40 mg total) by mouth daily., Disp: 90 tablet, Rfl: 1 .  Cholecalciferol (VITAMIN D) 2000 units CAPS, Take 1 capsule (2,000 Units total) by mouth daily., Disp: 30 capsule, Rfl: 0 .  furosemide (LASIX) 40 MG tablet, Take 1 tablet (40 mg total) by mouth daily as needed., Disp: 90 tablet, Rfl: 0 .  Glucosamine Sulfate 500 MG TABS, Take 1 tablet by mouth once a week. , Disp: , Rfl:  .  loratadine (CLARITIN) 10 MG tablet, Take 1 tablet (10 mg total) by mouth daily., Disp: 90 tablet, Rfl: 1 .  losartan (COZAAR) 25 MG tablet, TAKE 2 TABLETS (50 MG) BY MOUTH EVERY DAY-50 MG TABS ON BACKORDER, Disp: , Rfl:  .  metFORMIN (GLUCOPHAGE-XR) 750 MG 24 hr tablet, Take 2 tablets (1,500 mg total) by mouth daily with breakfast., Disp: 180 tablet, Rfl: 1 .  montelukast (SINGULAIR) 10 MG tablet, Take 1 tablet (10 mg total) by mouth every evening., Disp: 90 tablet, Rfl: 1 .  Multiple Vitamin (MULTI-VITAMINS) TABS, Take by mouth., Disp: , Rfl:  .   Potassium Chloride ER 20 MEQ TBCR, Take 10 mEq by mouth daily as needed. With lasix, Disp: , Rfl:  .  Zinc Acetate 50 MG CAPS, Take by mouth daily. , Disp: , Rfl:  .  Cyanocobalamin (B-12) 1000 MCG SUBL, Place 1 tablet under the tongue daily. (Patient not taking: Reported on 02/27/2018), Disp: 100 each, Rfl: 2 .  repaglinide (PRANDIN) 2 MG tablet, Take 1 tablet (2 mg total) by mouth 3 (three) times daily., Disp: 270 tablet, Rfl: 1  Allergies  Allergen Reactions  . Lisinopril     I personally reviewed active problem list, medication list, allergies, family history,  social history with the patient/caregiver today.   ROS  Constitutional: Negative for fever or significant  weight change.  Respiratory: Negative for cough and shortness of breath.   Cardiovascular: Negative for chest pain or palpitations.  Gastrointestinal: Negative for abdominal pain, no bowel changes.  Musculoskeletal: Negative for gait problem or joint swelling.  Skin: Negative for rash.  Neurological: Negative for dizziness or headache.  No other specific complaints in a complete review of systems (except as listed in HPI above).  Objective  Vitals:   02/27/18 1109  BP: 134/64  Pulse: 93  Resp: 16  Temp: 98 F (36.7 C)  TempSrc: Oral  SpO2: 98%  Weight: 202 lb 12.8 oz (92 kg)  Height: 5' 5"  (1.651 m)    Body mass index is 33.75 kg/m.  Physical Exam  Constitutional: Patient appears well-developed and well-nourished. Obese  No distress.  HEENT: head atraumatic, normocephalic, pupils equal and reactive to light,  neck supple, throat within normal limits Cardiovascular: Normal rate, regular rhythm and normal heart sounds.  No murmur heard. No BLE edema. Pulmonary/Chest: Effort normal and breath sounds normal. No respiratory distress. Abdominal: Soft.  There is no tenderness. Psychiatric: Patient has a normal mood and affect. behavior is normal. Judgment and thought content normal.  Recent Results (from the  past 2160 hour(s))  POCT urinalysis dipstick     Status: Abnormal   Collection Time: 12/04/17 11:06 AM  Result Value Ref Range   Color, UA Dark    Clarity, UA Cloudy    Glucose, UA Negative Negative   Bilirubin, UA Negative    Ketones, UA negative    Spec Grav, UA >=1.030 (A) 1.010 - 1.025   Blood, UA Small    pH, UA 5.0 5.0 - 8.0   Protein, UA Positive (A) Negative   Urobilinogen, UA 0.2 0.2 or 1.0 E.U./dL   Nitrite, UA Negative    Leukocytes, UA Small (1+) (A) Negative   Appearance     Odor    Urine Culture     Status: None   Collection Time: 12/04/17  1:05 PM  Result Value Ref Range   MICRO NUMBER: [id number]    SPECIMEN QUALITY: Adequate    Sample Source URINE    STATUS: FINAL    Result:      Three or more organisms present, each greater than 10,000 CFU/mL. May represent normal flora contamination from external genitalia. No further testing is required.  Urine Microalbumin w/creat. ratio     Status: None   Collection Time: 12/04/17  1:05 PM  Result Value Ref Range   Creatinine, Urine 239 20 - 275 mg/dL   Microalb, Ur 6.2 mg/dL    Comment: Reference Range Not established    Microalb Creat Ratio 26 <30 mcg/mg creat    Comment: . The ADA defines abnormalities in albumin excretion as follows: [place] Kitchen Category         Result (mcg/mg creatinine) . Normal                    <30 Microalbuminuria         30-299  Clinical albuminuria   > OR = 300 . The ADA recommends that at least two of three specimens collected within a 3-6 month period be abnormal before considering a patient to be within a diagnostic category.   HM DIABETES EYE EXAM     Status: Abnormal   Collection Time: 12/21/17 12:00 AM  Result Value Ref Range   HM Diabetic  Eye Exam Retinopathy (A) No Retinopathy    Comment: Proliferative Diabetic Retinopathy. Inactive-- No treatment   POCT HgB A1C     Status: Normal   Collection Time: 02/27/18 11:15 AM  Result Value Ref Range   Hemoglobin A1C     HbA1c POC (<>  result, manual entry)     HbA1c, POC (prediabetic range)     HbA1c, POC (controlled diabetic range) 6.8 0.0 - 7.0 %    PHQ2/9: Depression screen [facility] 2/9 02/27/2018 11/20/2017 10/24/2017 03/28/2017 03/28/2017  Decreased Interest 0 1 2 0 0  Down, Depressed, Hopeless 0 0 0 0 0  PHQ - 2 Score 0 1 2 0 0  Altered sleeping 0 0 1 0 -  Tired, decreased energy 0 0 1 0 -  Change in appetite 0 0 0 0 -  Feeling bad or failure about yourself  0 0 0 0 -  Trouble concentrating 0 0 0 0 -  Moving slowly or fidgety/restless 0 0 0 0 -  Suicidal thoughts 0 0 0 0 -  PHQ-9 Score 0 1 4 0 -  Difficult doing work/chores Not difficult at all Not difficult at all Not difficult at all Not difficult at all -    Fall Risk: Fall Risk  02/27/2018 11/20/2017 10/24/2017 06/23/2017 03/28/2017  Falls in the past year? 0 0 No No No  Risk for fall due to : - - - - History of fall(s);Impaired vision  Risk for fall due to: Comment - - - - fell on R knee at gas station     Functional Status Survey: Is the patient deaf or have difficulty hearing?: No Does the patient have difficulty seeing, even when wearing glasses/contacts?: No Does the patient have difficulty concentrating, remembering, or making decisions?: No Does the patient have difficulty walking or climbing stairs?: No Does the patient have difficulty dressing or bathing?: No Does the patient have difficulty doing errands alone such as visiting a doctor's office or shopping?: No    Assessment & Plan  1. Type 2 diabetes mellitus with pressure callus (HCC)  - POCT HgB A1C - repaglinide (PRANDIN) 2 MG tablet; Take 1 tablet (2 mg total) by mouth 3 (three) times daily.  Dispense: 270 tablet; Refill: 1  2. Anemia, unspecified type  - CBC with Differential/Platelet - B12 and Folate Panel - Ferritin  3. Benign essential HTN   4. 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  Dispense: 270 tablet; Refill: 1  5. Dyslipidemia  On medication   6. B12 deficiency  Recheck level  7. Dyslipidemia associated with type 2 diabetes mellitus (HCC)  - repaglinide (PRANDIN) 2 MG tablet; Take 1 tablet (2 mg total) by mouth 3 (three) times daily.  Dispense: 270 tablet; Refill: 1  8. 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  Dispense: 270 tablet; Refill: 1   9. Elevated sed rate  - Sed Rate (ESR) - C-reactive protein

## 2018-02-28 LAB — CBC WITH DIFFERENTIAL/PLATELET
ABSOLUTE MONOCYTES: 360 {cells}/uL (ref 200–950)
Basophils Absolute: 18 cells/uL (ref 0–200)
Basophils Relative: 0.3 %
Eosinophils Absolute: 48 cells/uL (ref 15–500)
Eosinophils Relative: 0.8 %
HCT: 33.8 % — ABNORMAL LOW (ref 35.0–45.0)
Hemoglobin: 11.2 g/dL — ABNORMAL LOW (ref 11.7–15.5)
LYMPHS ABS: 3306 {cells}/uL (ref 850–3900)
MCH: 30.8 pg (ref 27.0–33.0)
MCHC: 33.1 g/dL (ref 32.0–36.0)
MCV: 92.9 fL (ref 80.0–100.0)
MPV: 12.6 fL — ABNORMAL HIGH (ref 7.5–12.5)
Monocytes Relative: 6 %
NEUTROS PCT: 37.8 %
Neutro Abs: 2268 cells/uL (ref 1500–7800)
Platelets: 248 10*3/uL (ref 140–400)
RBC: 3.64 10*6/uL — ABNORMAL LOW (ref 3.80–5.10)
RDW: 13.8 % (ref 11.0–15.0)
TOTAL LYMPHOCYTE: 55.1 %
WBC: 6 10*3/uL (ref 3.8–10.8)

## 2018-02-28 LAB — C-REACTIVE PROTEIN: CRP: 0.7 mg/L (ref <8.0)

## 2018-02-28 LAB — B12 AND FOLATE PANEL
Folate: >24 ng/mL
Vitamin B-12: 694 pg/mL (ref 200–1100)

## 2018-02-28 LAB — SEDIMENTATION RATE: Sed Rate: 36 mm/h — ABNORMAL HIGH (ref 0–30)

## 2018-02-28 LAB — FERRITIN: Ferritin: 14 ng/mL — ABNORMAL LOW (ref 16–288)

## 2018-03-01 ENCOUNTER — Other Ambulatory Visit: Payer: Self-pay | Admitting: Family Medicine

## 2018-03-01 DIAGNOSIS — D508 Other iron deficiency anemias: Secondary | ICD-10-CM

## 2018-03-01 DIAGNOSIS — R700 Elevated erythrocyte sedimentation rate: Secondary | ICD-10-CM

## 2018-03-01 DIAGNOSIS — R634 Abnormal weight loss: Secondary | ICD-10-CM

## 2018-03-19 ENCOUNTER — Other Ambulatory Visit: Payer: Self-pay | Admitting: Family Medicine

## 2018-03-19 DIAGNOSIS — J3089 Other allergic rhinitis: Secondary | ICD-10-CM

## 2018-03-26 ENCOUNTER — Ambulatory Visit: Payer: Medicare Other | Admitting: Gastroenterology

## 2018-03-27 ENCOUNTER — Telehealth (INDEPENDENT_AMBULATORY_CARE_PROVIDER_SITE_OTHER): Payer: Medicare Other | Admitting: Gastroenterology

## 2018-03-27 ENCOUNTER — Other Ambulatory Visit: Payer: Self-pay

## 2018-03-27 DIAGNOSIS — D509 Iron deficiency anemia, unspecified: Secondary | ICD-10-CM | POA: Diagnosis not present

## 2018-03-27 NOTE — Progress Notes (Signed)
Cephas Darby, MD [address]  [place]  [place], [place] [address]  Main: [phone]  Fax: [phone]    Gastroenterology Consultation Virtual/Tele Visit  Referring Provider:     Steele Sizer, MD Primary Care Physician:  Steele Sizer, MD Primary Gastroenterologist:  Dr. Cephas Darby Reason for Consultation:     Iron deficiency anemia        HPI:   Beverly [name] is a 84 y.o. female referred by Dr. Steele Sizer, MD  for consultation & management of iron deficiency anemia  Virtual Visit via Telephone Note  I connected with Beverly [name] on 03/27/18 at  9:00 AM EDT by telephone and verified that I am speaking with the correct person using two identifiers.    I discussed the limitations, risks, security and privacy concerns of performing an evaluation and management service by telephone and the availability of in person appointments. I also discussed with the patient that there may be a patient responsible charge related to this service. The patient expressed understanding and agreed to proceed.  Location of physician: Home  Location of the patient: Home    History of Present Illness: Patient is fairly healthy, functionally independent. She had no particular concerns/symptoms today. She is found to have mild anemia Hb 11 since 06/2015. It has not progressed. Ferritin  20 in 10/2017,  14 in 02/2018. Normal folate and B12 levels. She is not on iron supplements. She denies CP, SOB, fatigue, melena, rectal bleeding, abdominal pain, n/v. She lost about 18lbs last year when she was going through lot of stress, when her daughter fighting with cancer and helping her granddaughter with moving. She regained about 4lbs back, eating healthy. 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 She had microscopic hematuria in 12/2017, underwent renal US, revealed probable calculus. She denies vaginal bleeding  NSAIDs: Aleve about a month ago for 1 month upto 2 times day when she has  sprained her back while helping her grand daughter  Antiplts/Anticoagulants/Anti thrombotics: None  GI Procedures:  Had colonoscopy 15 years ago, reportedly normal   She had hernia repair several years ago  Past Medical History:  Diagnosis Date   Allergy    Arthritis    Diabetes mellitus without complication ([place])    Hyperlipidemia    Hypertension     Past Surgical History:  Procedure Laterality Date   ABDOMINAL HYSTERECTOMY      Current Outpatient Medications:    alendronate (FOSAMAX) 35 MG tablet, Take with a full glass of water on an empty stomach., Disp: 12 tablet, Rfl: 1   aspirin 81 MG tablet, Take by mouth., Disp: , Rfl:    atorvastatin (LIPITOR) 40 MG tablet, Take 1 tablet (40 mg total) by mouth daily., Disp: 90 tablet, Rfl: 1   Cholecalciferol (VITAMIN D) 2000 units CAPS, Take 1 capsule (2,000 Units total) by mouth daily., Disp: 30 capsule, Rfl: 0   furosemide (LASIX) 40 MG tablet, Take 1 tablet (40 mg total) by mouth daily as needed., Disp: 90 tablet, Rfl: 0   Glucosamine Sulfate 500 MG TABS, Take 1 tablet by mouth once a week. , Disp: , Rfl:    loratadine (CLARITIN) 10 MG tablet, Take 1 tablet (10 mg total) by mouth daily., Disp: 90 tablet, Rfl: 1   losartan (COZAAR) 25 MG tablet, TAKE 2 TABLETS (50 MG) BY MOUTH EVERY DAY-50 MG TABS ON BACKORDER, Disp: , Rfl:    metFORMIN (GLUCOPHAGE-XR) 750 MG 24 hr tablet,  Take 2 tablets (1,500 mg total) by mouth daily with breakfast., Disp: 180 tablet, Rfl: 1   montelukast (SINGULAIR) 10 MG tablet, TAKE 1 TABLET BY MOUTH EVERY DAY IN THE EVENING, Disp: 90 tablet, Rfl: 1   Multiple Vitamin (MULTI-VITAMINS) TABS, Take by mouth., Disp: , Rfl:    Potassium Chloride ER 20 MEQ TBCR, Take 10 mEq by mouth daily as needed. With lasix, Disp: , Rfl:    repaglinide (PRANDIN) 2 MG tablet, Take 1 tablet (2 mg total) by mouth 3 (three) times daily., Disp: 270 tablet, Rfl: 1   Zinc Acetate 50 MG CAPS, Take by mouth daily. ,  Disp: , Rfl:    Cyanocobalamin (B-12) 1000 MCG SUBL, Place 1 tablet under the tongue daily. (Patient not taking: Reported on 02/27/2018), Disp: 100 each, Rfl: 2   Family History  Problem Relation Age of Onset   Hypertension Mother    Healthy Father    Healthy Daughter    Raynaud syndrome Daughter    Hypertension Daughter    Stroke Sister      Social History   Tobacco Use   Smoking status: Never Smoker   Smokeless tobacco: Never Used   Tobacco comment: smoking cessation materials not required  Substance Use Topics   Alcohol use: No    Alcohol/week: 0.0 standard drinks   Drug use: No    Allergies as of 03/27/2018 - Review Complete 03/27/2018  Allergen Reaction Noted   Lisinopril  06/02/2015     Imaging Studies: Reviewed  Assessment and Plan:   Beverly [name] is a 84 y.o. female with h/o HTN, DM, HLD well controlled referred for IDA. 1 month h/o NSAID use for back pain, no longer on it. May or may not be contributing to her anemia as she has been anemic for 2-3 years. She has mild anemia since 2017. Ferritin levels are low. No obvious symptoms to suggest ongoing active GI bleed. Advised her to start oral iron 325mg  daily. Explained about constipation and change in stool color on oral iron. Also, recommend non-urgent EGD and colonoscopy in next 10months   Follow Up Instructions:   I discussed the assessment and treatment plan with the patient. The patient was provided an opportunity to ask questions and all were answered. The patient agreed with the plan and demonstrated an understanding of the instructions.   The patient was advised to call back or seek an in-person evaluation if the symptoms worsen or if the condition fails to improve as anticipated.  I provided 25 minutes of non-face-to-face time during this encounter.   Follow up in 4weeks   Cephas Darby, MD

## 2018-03-27 NOTE — Telephone Encounter (Signed)
Metformin 750 mg is no longer available. Asking Dr. Ancil Boozer to please send in another prescription.

## 2018-03-28 MED ORDER — METFORMIN HCL ER 500 MG PO TB24: 500.0000 mg | ORAL_TABLET | Freq: Three times a day (TID) | ORAL | 0 refills | Status: DC

## 2018-03-29 ENCOUNTER — Other Ambulatory Visit: Payer: Self-pay

## 2018-03-29 DIAGNOSIS — D509 Iron deficiency anemia, unspecified: Secondary | ICD-10-CM

## 2018-04-14 ENCOUNTER — Other Ambulatory Visit: Payer: Self-pay | Admitting: Family Medicine

## 2018-04-14 DIAGNOSIS — E113553 Type 2 diabetes mellitus with stable proliferative diabetic retinopathy, bilateral: Secondary | ICD-10-CM

## 2018-04-14 DIAGNOSIS — I10 Essential (primary) hypertension: Secondary | ICD-10-CM

## 2018-04-25 ENCOUNTER — Ambulatory Visit: Payer: Medicare Other | Admitting: Gastroenterology

## 2018-05-02 ENCOUNTER — Other Ambulatory Visit: Payer: Self-pay | Admitting: Family Medicine

## 2018-05-02 DIAGNOSIS — M8580 Other specified disorders of bone density and structure, unspecified site: Secondary | ICD-10-CM

## 2018-05-08 ENCOUNTER — Other Ambulatory Visit: Payer: Self-pay

## 2018-05-08 ENCOUNTER — Ambulatory Visit (INDEPENDENT_AMBULATORY_CARE_PROVIDER_SITE_OTHER): Payer: Medicare Other | Admitting: Gastroenterology

## 2018-05-08 DIAGNOSIS — D509 Iron deficiency anemia, unspecified: Secondary | ICD-10-CM

## 2018-05-08 NOTE — Progress Notes (Signed)
Beverly Darby, MD [address]  [place]  [place], [place] [address]  Main: [phone]  Fax: [phone]    Gastroenterology Consultation Virtual/Tele Visit  Referring Provider:     Steele Sizer, MD Primary Care Physician:  Steele Sizer, MD Primary Gastroenterologist:  Dr. Cephas [name] Reason for Consultation:     Iron deficiency anemia        HPI:   Beverly [name] is a 84 y.o. female referred by Dr. Steele Sizer, MD  for consultation & management of iron deficiency anemia  Virtual Visit via Telephone Note  I connected with Beverly [name] on 05/08/18 at 11:15 AM EDT by telephone and verified that I am speaking with the correct person using two identifiers.    I discussed the limitations, risks, security and privacy concerns of performing an evaluation and management service by telephone and the availability of in person appointments. I also discussed with the patient that there may be a patient responsible charge related to this service. The patient expressed understanding and agreed to proceed.  Location of physician: Home  Location of the patient: Home    History of Present Illness: Patient is fairly healthy, functionally independent. She had no particular concerns/symptoms today. She is found to have mild anemia Hb 11 since 06/2015. It has not progressed. Ferritin  20 in 10/2017,  14 in 02/2018. Normal folate and B12 levels. She is not on iron supplements. She denies CP, SOB, fatigue, melena, rectal bleeding, abdominal pain, n/v. She lost about 18lbs last year when she was going through lot of stress, when her daughter fighting with cancer and helping her granddaughter with moving. She regained about 4lbs back, eating healthy. 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 She had microscopic hematuria in 12/2017, underwent renal US, revealed probable calculus. She denies vaginal bleeding  Follow-up visit 05/08/2018 Patient has no new symptoms today.  She started  oral iron 325 mg 1 pill daily and tolerating it well.  She denies any GI symptoms today.  She is scheduled for EGD and colonoscopy on 06/14/2018  NSAIDs: Aleve about a month ago for 1 month upto 2 times day when she has sprained her back while helping her grand daughter  Antiplts/Anticoagulants/Anti thrombotics: None  GI Procedures:  Had colonoscopy 15 years ago, reportedly normal   She had hernia repair several years ago  Past Medical History:  Diagnosis Date  . Allergy   . Arthritis   . Diabetes mellitus without complication ([facility])   . Hyperlipidemia   . Hypertension     Past Surgical History:  Procedure Laterality Date  . ABDOMINAL HYSTERECTOMY      Current Outpatient Medications:  .  alendronate (FOSAMAX) 35 MG tablet, TAKE 1 TABLET BY MOUTH WEEKLY WITH A FULL GLASS OF WATER ON AN EMPTY STOMACH., Disp: 12 tablet, Rfl: 1 .  aspirin 81 MG tablet, Take by mouth., Disp: , Rfl:  .  atorvastatin (LIPITOR) 40 MG tablet, Take 1 tablet (40 mg total) by mouth daily., Disp: 90 tablet, Rfl: 1 .  Cholecalciferol (VITAMIN D) 2000 units CAPS, Take 1 capsule (2,000 Units total) by mouth daily., Disp: 30 capsule, Rfl: 0 .  ferrous sulfate 325 (65 FE) MG tablet, Take by mouth., Disp: , Rfl:  .  furosemide (LASIX) 40 MG tablet, Take 1 tablet (40 mg total) by mouth daily as needed., Disp: 90 tablet, Rfl: 0 .  Glucosamine Sulfate 500 MG TABS, Take 1 tablet by mouth once  a week. , Disp: , Rfl:  .  loratadine (CLARITIN) 10 MG tablet, Take 1 tablet (10 mg total) by mouth daily., Disp: 90 tablet, Rfl: 1 .  losartan (COZAAR) 25 MG tablet, TAKE 2 TABLETS (50 MG) BY MOUTH EVERY DAY-50 MG TABS ON BACKORDER, Disp: 180 tablet, Rfl: 0 .  metFORMIN (GLUCOPHAGE-XR) 500 MG 24 hr tablet, Take 1 tablet (500 mg total) by mouth 3 (three) times daily., Disp: 270 tablet, Rfl: 0 .  montelukast (SINGULAIR) 10 MG tablet, TAKE 1 TABLET BY MOUTH EVERY DAY IN THE EVENING, Disp: 90 tablet, Rfl: 1 .  Multiple Vitamin  (MULTI-VITAMINS) TABS, Take by mouth., Disp: , Rfl:  .  Potassium Chloride ER 20 MEQ TBCR, Take 10 mEq by mouth daily as needed. With lasix, Disp: , Rfl:  .  repaglinide (PRANDIN) 2 MG tablet, Take 1 tablet (2 mg total) by mouth 3 (three) times daily., Disp: 270 tablet, Rfl: 1 .  Zinc Acetate 50 MG CAPS, Take by mouth daily. , Disp: , Rfl:  .  Cyanocobalamin (B-12) 1000 MCG SUBL, Place 1 tablet under the tongue daily. (Patient not taking: Reported on 02/27/2018), Disp: 100 each, Rfl: 2   Family History  Problem Relation Age of Onset  . Hypertension Mother   . Healthy Father   . Healthy Daughter   . Raynaud syndrome Daughter   . Hypertension Daughter   . Stroke Sister      Social History   Tobacco Use  . Smoking status: Never Smoker  . Smokeless tobacco: Never Used  . Tobacco comment: smoking cessation materials not required  Substance Use Topics  . Alcohol use: No    Alcohol/week: 0.0 standard drinks  . Drug use: No    Allergies as of 05/08/2018 - Review Complete 05/08/2018  Allergen Reaction Noted  . Lisinopril  06/02/2015     Imaging Studies: Reviewed  Assessment and Plan:   Beverly [name] is a 84 y.o. female with h/o HTN, DM, HLD well controlled referred for IDA. 1 month h/o NSAID use for back pain, no longer on it. May or may not be contributing to her anemia as she has been anemic for 2-3 years. She has mild anemia since 2017. Ferritin levels are low. No obvious symptoms to suggest ongoing active GI bleed.  Continue oral iron 325mg  daily, increase to 2-3 times a day as she is tolerating it well.  Cautioned her about dark stools.  Also, recommend non-urgent EGD and colonoscopy as scheduled Recheck CBC, iron studies in 51month   Follow Up Instructions:   I discussed the assessment and treatment plan with the patient. The patient was provided an opportunity to ask questions and all were answered. The patient agreed with the plan and demonstrated an understanding of the  instructions.   The patient was advised to call back or seek an in-person evaluation if the symptoms worsen or if the condition fails to improve as anticipated.  I provided 10 minutes of non-face-to-face time during this encounter.   Follow up in 2 months  Beverly Darby, MD

## 2018-05-13 ENCOUNTER — Other Ambulatory Visit: Payer: Self-pay | Admitting: Family Medicine

## 2018-05-13 DIAGNOSIS — E785 Hyperlipidemia, unspecified: Secondary | ICD-10-CM

## 2018-05-13 DIAGNOSIS — E1169 Type 2 diabetes mellitus with other specified complication: Secondary | ICD-10-CM

## 2018-06-08 ENCOUNTER — Encounter: Admission: RE | Admit: 2018-06-08 | Payer: Medicare Other | Source: Ambulatory Visit

## 2018-06-11 ENCOUNTER — Other Ambulatory Visit: Admission: RE | Admit: 2018-06-11 | Payer: Medicare Other | Source: Ambulatory Visit

## 2018-06-14 ENCOUNTER — Ambulatory Visit: Admit: 2018-06-14 | Payer: Medicare Other | Admitting: Gastroenterology

## 2018-06-14 SURGERY — ESOPHAGOGASTRODUODENOSCOPY (EGD) WITH PROPOFOL
Anesthesia: General

## 2018-06-18 ENCOUNTER — Telehealth: Payer: Self-pay | Admitting: Gastroenterology

## 2018-06-18 ENCOUNTER — Other Ambulatory Visit: Payer: Self-pay

## 2018-06-18 DIAGNOSIS — D509 Iron deficiency anemia, unspecified: Secondary | ICD-10-CM

## 2018-06-18 NOTE — Telephone Encounter (Signed)
Pt's procedures have been rescheduled to 07/02/02020, pt verbalized understanding

## 2018-06-18 NOTE — Telephone Encounter (Signed)
Pt left vm she states she received a call from [facility] about a Covid19 test she missed, she states she was not aware she had that apt but does want have it please reschedule and call pt with apt info

## 2018-06-19 ENCOUNTER — Telehealth: Payer: Self-pay

## 2018-06-19 NOTE — Telephone Encounter (Signed)
Copied from [place] [id number]. Topic: General - Other >> Jun 19, 2018  2:46 PM Leward Quan A wrote: Reason for CRM: Patient called to say that she received a message that there have been a recall on Metformin asking that her metFORMIN (GLUCOPHAGE-XR) 500 MG 24 hr tablet  get changed to something else. Asking to be informed when done. Ph# [phone]

## 2018-06-20 MED ORDER — METFORMIN HCL ER 500 MG PO TB24: 500.0000 mg | ORAL_TABLET | Freq: Three times a day (TID) | ORAL | 0 refills | Status: DC

## 2018-06-20 NOTE — Telephone Encounter (Signed)
Request for diabetes medication. Metformin to [facility]  Last office visit pertaining to diabetes: 02/27/2018  Lab Results  Component Value Date   HGBA1C 6.8 02/27/2018   Spoke with Pharmacist at [facility]- they have the Metformin 500 mg XR in stock. Patient just needs a refill for this prescription.    Follow up on 07/02/2018

## 2018-06-21 MED ORDER — METFORMIN HCL 1000 MG PO TABS: 1000.0000 mg | ORAL_TABLET | Freq: Two times a day (BID) | ORAL | 0 refills | Status: DC

## 2018-06-21 NOTE — Addendum Note (Signed)
Addended by: Steele Sizer F on: 06/21/2018 01:38 PM   Modules accepted: Orders

## 2018-06-21 NOTE — Telephone Encounter (Signed)
Almyra Free informed me the extended release metformin was affected not the immediate release.

## 2018-06-22 ENCOUNTER — Telehealth: Payer: Self-pay | Admitting: Gastroenterology

## 2018-06-22 NOTE — Telephone Encounter (Signed)
Pt is calling to reschedule her colonoscopy from June  20 th to the week July 26th. Due to having a ride

## 2018-06-26 NOTE — Telephone Encounter (Signed)
Patient l/m on v/m & needs to cx procedure 07-23-18 & r/s.

## 2018-06-27 NOTE — Telephone Encounter (Signed)
Pt left vm for Beverly [name] she needs to reschedule her procedure from July 20 th to the week of August 9th or week of August 23rd please call pt

## 2018-07-02 ENCOUNTER — Encounter: Payer: Self-pay | Admitting: Family Medicine

## 2018-07-02 ENCOUNTER — Other Ambulatory Visit: Payer: Self-pay

## 2018-07-02 ENCOUNTER — Ambulatory Visit: Payer: Medicare Other | Admitting: Family Medicine

## 2018-07-02 VITALS — BP 118/64 | HR 97 | Temp 97.1°F | Resp 16 | Ht 65.0 in | Wt 200.5 lb

## 2018-07-02 DIAGNOSIS — M1711 Unilateral primary osteoarthritis, right knee: Secondary | ICD-10-CM | POA: Diagnosis not present

## 2018-07-02 DIAGNOSIS — L84 Corns and callosities: Secondary | ICD-10-CM

## 2018-07-02 DIAGNOSIS — E1169 Type 2 diabetes mellitus with other specified complication: Secondary | ICD-10-CM

## 2018-07-02 DIAGNOSIS — E113553 Type 2 diabetes mellitus with stable proliferative diabetic retinopathy, bilateral: Secondary | ICD-10-CM | POA: Diagnosis not present

## 2018-07-02 DIAGNOSIS — E11628 Type 2 diabetes mellitus with other skin complications: Secondary | ICD-10-CM | POA: Diagnosis not present

## 2018-07-02 DIAGNOSIS — I10 Essential (primary) hypertension: Secondary | ICD-10-CM

## 2018-07-02 DIAGNOSIS — E785 Hyperlipidemia, unspecified: Secondary | ICD-10-CM

## 2018-07-02 DIAGNOSIS — J3089 Other allergic rhinitis: Secondary | ICD-10-CM

## 2018-07-02 LAB — POCT GLYCOSYLATED HEMOGLOBIN (HGB A1C): HbA1c, POC (controlled diabetic range): 6.6 % (ref 0.0–7.0)

## 2018-07-02 MED ORDER — METFORMIN HCL 1000 MG PO TABS: 1000.0000 mg | ORAL_TABLET | Freq: Two times a day (BID) | ORAL | 0 refills | Status: DC

## 2018-07-02 MED ORDER — MONTELUKAST SODIUM 10 MG PO TABS: 10.0000 mg | ORAL_TABLET | Freq: Every day | ORAL | 1 refills | Status: DC

## 2018-07-02 MED ORDER — REPAGLINIDE 2 MG PO TABS: 2.0000 mg | ORAL_TABLET | Freq: Three times a day (TID) | ORAL | 1 refills | Status: DC

## 2018-07-02 MED ORDER — ATORVASTATIN CALCIUM 40 MG PO TABS: 40.0000 mg | ORAL_TABLET | Freq: Every day | ORAL | 1 refills | Status: DC

## 2018-07-02 NOTE — Progress Notes (Signed)
Name: Beverly [name]   MRN: [id number]    DOB: 21-Oct-1934   Date:07/02/2018       Progress Note  Subjective  Chief Complaint  Chief Complaint  Patient presents with  . Medication Refill  . Diabetes    Checks BS three times weekly, Fasting average-95-105 After meals BS-150  . Hypertension    Only has edema in her ankles while out in the heat  . Hyperlipidemia  . Allergic Rhinitis   . Osteoarthritis  . Obesity    HPI  DMII: she has been taking Metformin to 1500 mg daily,takingPrandin before breakfast and dinner ( usually eats a very light lunch). Fasting 95-105 and post- prandially145-150  Eye exam is up to date She denies polyphagia or polydipsia but she has nocturia - twice per night - stable. No side effects of medication, no recent hypoglycemic episodes. No blurred vision. HgbA1C was 7.6%,to 8.5% down to 6.8% up to8.2%, 8.4%, down to 6.7%, 7.2% , 7.1%, 6.8% and today it is 6.6%   HTN:BP is towards low end of normal,  no chest pain, no palpitationor dizziness, she is off HCTZ . Taking medication as prescribed, no side effects. Takes Furosemide prn only for severe swelling, discussed compression stocking hoses to control swelling. Discussed discussing with Dr. Holley Raring to see if she needs to go down on dose of Losartan   Hyperlipidemia: taking Atorvastatin, no myalgias. Unchanged   AR: well controlled, taking medication prn and is doing well at this time, no rhinorrhea or nasal congestion at this time. Unchanged   OA: she has right knee pain, it started after a fall 34 years ago secondary to a fall, she takes arthritis pain pill otc Pain is described as dull and aching, intermittent but is doing well on tylenol. Seeing chiropractor - Dr. Freddi Che and is doing better. Zero pain at this time   Unintentional weight loss: she hadlost 18 lbs from 2018 to 2019 .  She states she was under a lot of stress since Summer of 2018 worried about grand-daughter  also her daughter was  diagnosed with uterine cancer. Since she lost weight, she got motivated to eat healthier and very seldom having dessert. She is feeling better now, she is just down only 5 lbs since last year   Right kidney stone: seen by nephrologist, had a renal US that showed 6 mm non obstructing stone, no pain at this time or hematuria , she has a follow up coming up soon  Patient Active Problem List   Diagnosis Date Noted  . 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 Type 2 diabetes mellitus with pressure callus ([facility]) 10/24/2017  . Diabetic retinopathy ([place]) 11/12/2015  . Obesity (BMI 30.0-34.9) 06/02/2015  . Osteoarthritis 06/02/2015  . Osteopenia 06/02/2015  . Narrowing of intervertebral disc space 06/02/2015  . Dyslipidemia associated with type 2 diabetes mellitus ([facility]) 06/02/2015  . Perennial allergic rhinitis 02/28/2007  . Benign essential HTN 06/26/2006  . Pure hypercholesterolemia 06/26/2006    Past Surgical History:  Procedure Laterality Date  . ABDOMINAL HYSTERECTOMY      Family History  Problem Relation Age of Onset  . Hypertension Mother   . Healthy Father   . Healthy Daughter   . Raynaud syndrome Daughter   . Hypertension Daughter   . Stroke Sister     Social History   Socioeconomic History  . Marital status: Widowed    Spouse name:  [name]  . Number of children: 2  . Years of education: Not on file  . Highest education level: Bachelor's degree (e.g., BA, AB, BS)  Occupational History  . Occupation: Retired  Scientific laboratory technician  . Financial resource strain: Not hard at all  . Food insecurity    Worry: Never true    Inability: Never true  . Transportation needs    Medical: No    Non-medical: No  Tobacco Use  . Smoking status: Never Smoker  . Smokeless tobacco: Never Used  . Tobacco comment: smoking cessation materials not required  Substance and Sexual Activity  . Alcohol  use: No    Alcohol/week: 0.0 standard drinks  . Drug use: No  . Sexual activity: Not Currently  Lifestyle  . Physical activity    Days per week: 3 days    Minutes per session: 40 min  . Stress: Not at all  Relationships  . Social Herbalist on phone: Patient refused    Gets together: Patient refused    Attends religious service: Patient refused    Active member of club or organization: Patient refused    Attends meetings of clubs or organizations: Patient refused    Relationship status: Widowed  . Intimate partner violence    Fear of current or ex partner: No    Emotionally abused: No    Physically abused: No    Forced sexual activity: No  Other Topics Concern  . Not on file  Social History Narrative  . Not on file     Current Outpatient Medications:  .  alendronate (FOSAMAX) 35 MG tablet, TAKE 1 TABLET BY MOUTH WEEKLY WITH A FULL GLASS OF WATER ON AN EMPTY STOMACH., Disp: 12 tablet, Rfl: 1 .  aspirin 81 MG tablet, Take by mouth., Disp: , Rfl:  .  atorvastatin (LIPITOR) 40 MG tablet, TAKE 1 TABLET BY MOUTH EVERY DAY, Disp: 90 tablet, Rfl: 0 .  Cholecalciferol (VITAMIN D) 2000 units CAPS, Take 1 capsule (2,000 Units total) by mouth daily., Disp: 30 capsule, Rfl: 0 .  ferrous sulfate 325 (65 FE) MG tablet, Take 325 mg by mouth 2 (two) times daily with a meal. , Disp: , Rfl:  .  furosemide (LASIX) 40 MG tablet, Take 1 tablet (40 mg total) by mouth daily as needed., Disp: 90 tablet, Rfl: 0 .  Glucosamine Sulfate 500 MG TABS, Take 1 tablet by mouth once a week. , Disp: , Rfl:  .  loratadine (CLARITIN) 10 MG tablet, Take 1 tablet (10 mg total) by mouth daily., Disp: 90 tablet, Rfl: 1 .  losartan (COZAAR) 25 MG tablet, TAKE 2 TABLETS (50 MG) BY MOUTH EVERY DAY-50 MG TABS ON BACKORDER, Disp: 180 tablet, Rfl: 0 .  metFORMIN (GLUCOPHAGE) 1000 MG tablet, Take 1 tablet (1,000 mg total) by mouth 2 (two) times daily with a meal., Disp: 180 tablet, Rfl: 0 .  montelukast (SINGULAIR)  10 MG tablet, TAKE 1 TABLET BY MOUTH EVERY DAY IN THE EVENING, Disp: 90 tablet, Rfl: 1 .  Multiple Vitamin (MULTI-VITAMINS) TABS, Take by mouth., Disp: , Rfl:  .  Potassium Chloride ER 20 MEQ TBCR, Take 10 mEq by mouth daily as needed. With lasix, Disp: , Rfl:  .  repaglinide (PRANDIN) 2 MG tablet, Take 1 tablet (2 mg total) by mouth 3 (three) times daily., Disp: 270 tablet, Rfl: 1 .  vitamin C (ASCORBIC ACID) 500 MG tablet, Take 500 mg by mouth daily., Disp: , Rfl:  .  Zinc Acetate 50 MG CAPS, Take by mouth daily. , Disp: , Rfl:  .  Cyanocobalamin (B-12) 1000 MCG SUBL, Place 1 tablet under the tongue daily. (Patient not taking: Reported on 07/02/2018), Disp: 100 each, Rfl: 2  Allergies  Allergen Reactions  . Lisinopril     I personally reviewed active problem list, medication list, allergies, family history, social history with the patient/caregiver today.   ROS  Constitutional: Negative for fever or weight change.  Respiratory: Negative for cough and shortness of breath.   Cardiovascular: Negative for chest pain or palpitations.  Gastrointestinal: Negative for abdominal pain, no bowel changes.  Musculoskeletal: Negative for gait problem or joint swelling.  Skin: Negative for rash.  Neurological: Negative for dizziness or headache.  No other specific complaints in a complete review of systems (except as listed in HPI above).  Objective  Vitals:   07/02/18 1100  BP: 118/64  Pulse: 97  Resp: 16  Temp: (!) 97.1 F (36.2 C)  TempSrc: Oral  SpO2: 97%  Weight: 200 lb 8 oz (90.9 kg)  Height: 5\' 5"  (1.651 m)    Body mass index is 33.36 kg/m.  Physical Exam  Constitutional: Patient appears well-developed and well-nourished. Obese  No distress.  HEENT: head atraumatic, normocephalic, pupils equal and reactive to light, neck supple Cardiovascular: Normal rate, regular rhythm and normal heart sounds.  No murmur heard. trace  BLE edema. Pulmonary/Chest: Effort normal and breath  sounds normal. No respiratory distress. Abdominal: Soft.  There is no tenderness. Psychiatric: Patient has a normal mood and affect. behavior is normal. Judgment and thought content normal.  Recent Results (from the past 2160 hour(s))  POCT HgB A1C     Status: Normal   Collection Time: 07/02/18 11:17 AM  Result Value Ref Range   Hemoglobin A1C     HbA1c POC (<> result, manual entry)     HbA1c, POC (prediabetic range)     HbA1c, POC (controlled diabetic range) 6.6 0.0 - 7.0 %    PHQ2/9: Depression screen [facility] 2/9 07/02/2018 02/27/2018 11/20/2017 10/24/2017 03/28/2017  Decreased Interest 1 0 1 2 0  Down, Depressed, Hopeless 0 0 0 0 0  PHQ - 2 Score 1 0 1 2 0  Altered sleeping 0 0 0 1 0  Tired, decreased energy 0 0 0 1 0  Change in appetite 0 0 0 0 0  Feeling bad or failure about yourself  0 0 0 0 0  Trouble concentrating 0 0 0 0 0  Moving slowly or fidgety/restless 0 0 0 0 0  Suicidal thoughts 0 0 0 0 0  PHQ-9 Score 1 0 1 4 0  Difficult doing work/chores Not difficult at all Not difficult at all Not difficult at all Not difficult at all Not difficult at all    phq 9 is positive   Fall Risk: Fall Risk  07/02/2018 02/27/2018 11/20/2017 10/24/2017 06/23/2017  Falls in the past year? 0 0 0 No No  Number falls in past yr: 0 - - - -  Injury with Fall? 0 - - - -  Risk for fall due to : - - - - -  Risk for fall due to: Comment - - - - -     Functional Status Survey: Is the patient deaf or have difficulty hearing?: No Does the patient have difficulty seeing, even when wearing glasses/contacts?: No Does the patient have difficulty concentrating, remembering, or making decisions?: No Does the patient have difficulty walking or climbing stairs?: No  Does the patient have difficulty dressing or bathing?: No Does the patient have difficulty doing errands alone such as visiting a doctor's office or shopping?: No    Assessment & Plan   1. Type 2 diabetes mellitus with pressure callus  ([facility])  - POCT HgB A1C - repaglinide (PRANDIN) 2 MG tablet; Take 1 tablet (2 mg total) by mouth 3 (three) times daily.  Dispense: 270 tablet; Refill: 1  2. 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  Dispense: 180 tablet; Refill: 0 - repaglinide (PRANDIN) 2 MG tablet; Take 1 tablet (2 mg total) by mouth 3 (three) times daily.  Dispense: 270 tablet; Refill: 1  3. Primary osteoarthritis of right knee  Stable   4. Benign essential HTN  Towards low end of normal, seeing nephrologist and will ask if okay to go down on the dose to 25 mg of losartan   5. Perennial allergic rhinitis  - montelukast (SINGULAIR) 10 MG tablet; Take 1 tablet (10 mg total) by mouth at bedtime.  Dispense: 90 tablet; Refill: 1  6. Dyslipidemia associated with type 2 diabetes mellitus ([facility])  - atorvastatin (LIPITOR) 40 MG tablet; Take 1 tablet (40 mg total) by mouth daily.  Dispense: 90 tablet; Refill: 1 - metFORMIN (GLUCOPHAGE) 1000 MG tablet; Take 1 tablet (1,000 mg total) by mouth 2 (two) times daily with a meal.  Dispense: 180 tablet; Refill: 0 - repaglinide (PRANDIN) 2 MG tablet; Take 1 tablet (2 mg total) by mouth 3 (three) times daily.  Dispense: 270 tablet; Refill: 1  7. Dyslipidemia  - atorvastatin (LIPITOR) 40 MG tablet; Take 1 tablet (40 mg total) by mouth daily.  Dispense: 90 tablet; Refill: 1  8. 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  Dispense: 180 tablet; Refill: 0 - repaglinide (PRANDIN) 2 MG tablet; Take 1 tablet (2 mg total) by mouth 3 (three) times daily.  Dispense: 270 tablet; Refill: 1

## 2018-07-04 ENCOUNTER — Other Ambulatory Visit: Payer: Self-pay

## 2018-07-04 NOTE — Telephone Encounter (Signed)
Pt has been rescheduled for procedures, new date is 08/13/2018, pt has been notified

## 2018-07-10 ENCOUNTER — Other Ambulatory Visit: Payer: Self-pay | Admitting: Family Medicine

## 2018-07-10 DIAGNOSIS — I10 Essential (primary) hypertension: Secondary | ICD-10-CM

## 2018-07-10 DIAGNOSIS — E113553 Type 2 diabetes mellitus with stable proliferative diabetic retinopathy, bilateral: Secondary | ICD-10-CM

## 2018-07-11 ENCOUNTER — Other Ambulatory Visit: Payer: Self-pay

## 2018-07-11 ENCOUNTER — Ambulatory Visit
Admission: RE | Admit: 2018-07-11 | Discharge: 2018-07-11 | Disposition: A | Discharge status: Home / Self Care | Payer: Medicare Other | Source: Ambulatory Visit | Attending: Nephrology | Admitting: Nephrology

## 2018-07-11 DIAGNOSIS — R319 Hematuria, unspecified: Secondary | ICD-10-CM | POA: Insufficient documentation

## 2018-07-11 IMAGING — US US RENAL
1 series · 14 of 25 positions shown · non-contrast
Comparison: [DATE]

CLINICAL DATA: Hematuria, history of LEFT side kidney stone

EXAM:
RENAL / URINARY TRACT ULTRASOUND COMPLETE

[Series 1: us renal · 0.23mm/px · 14 of 40 slices shown]
[im 1/40]
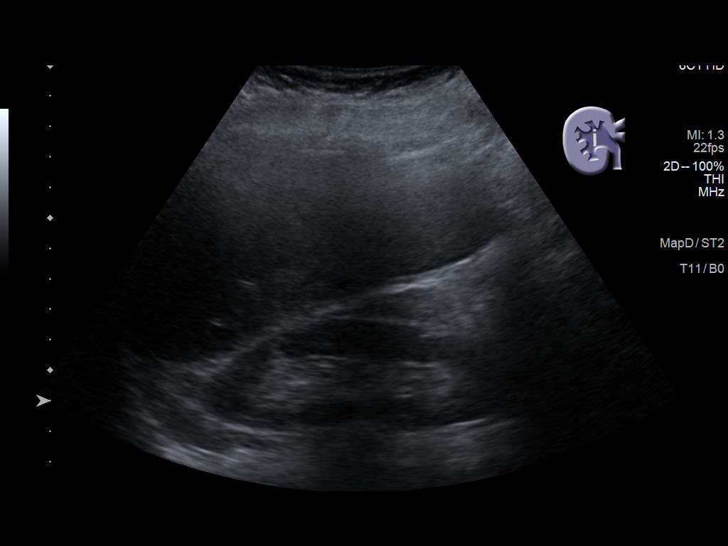
[im 4/40]
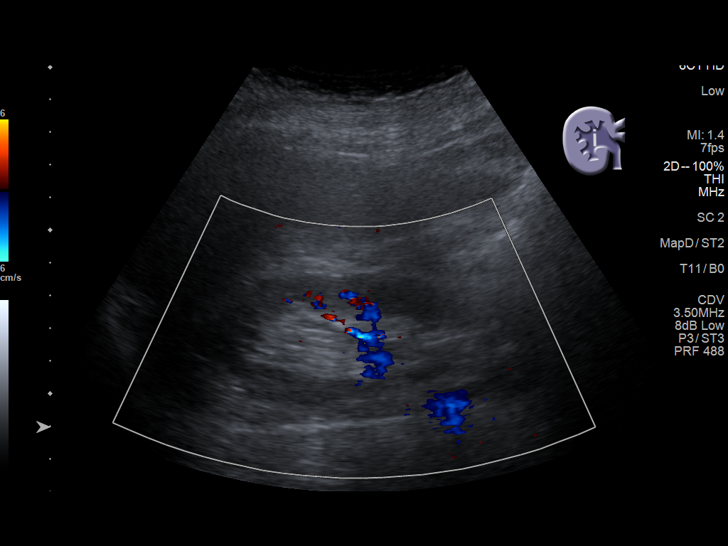
[im 7/40]
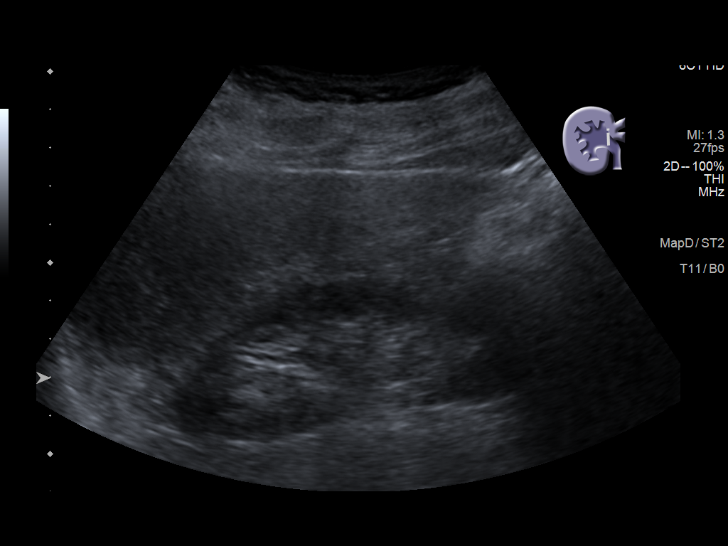
[im 10/40]
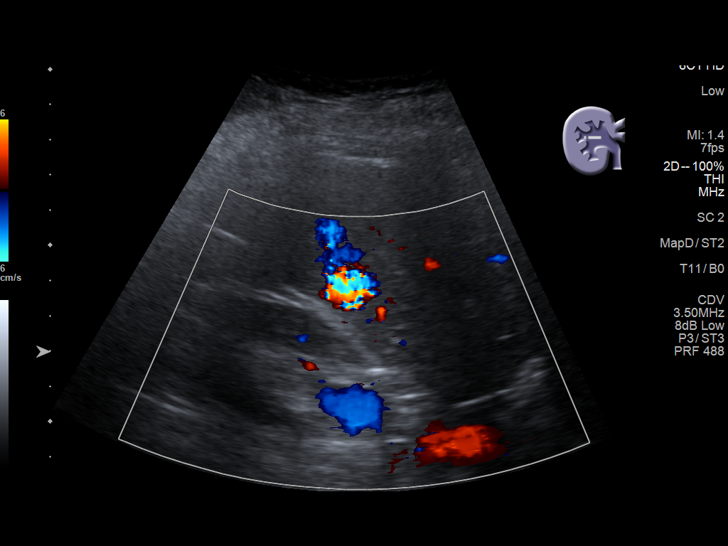
[im 14/40]
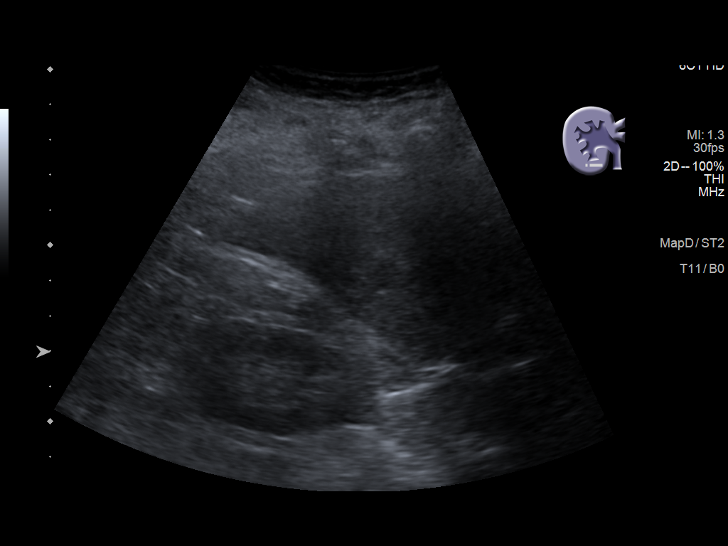
[im 15/40]
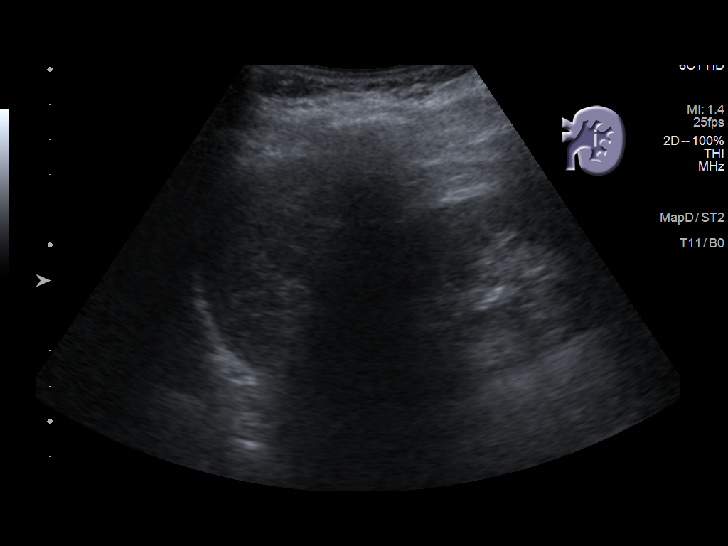
[im 18/40]
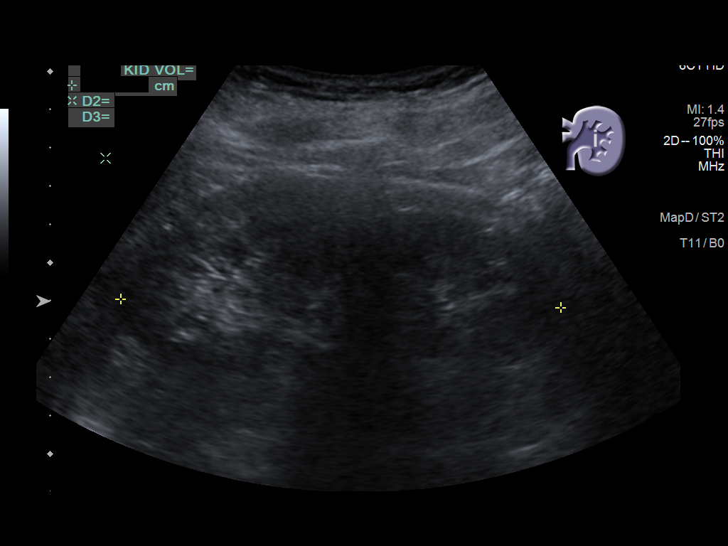
[im 22/40]
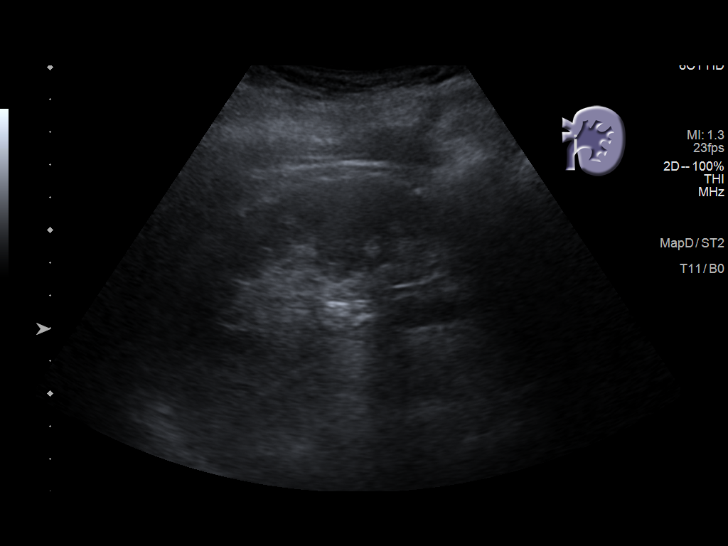
[im 25/40]
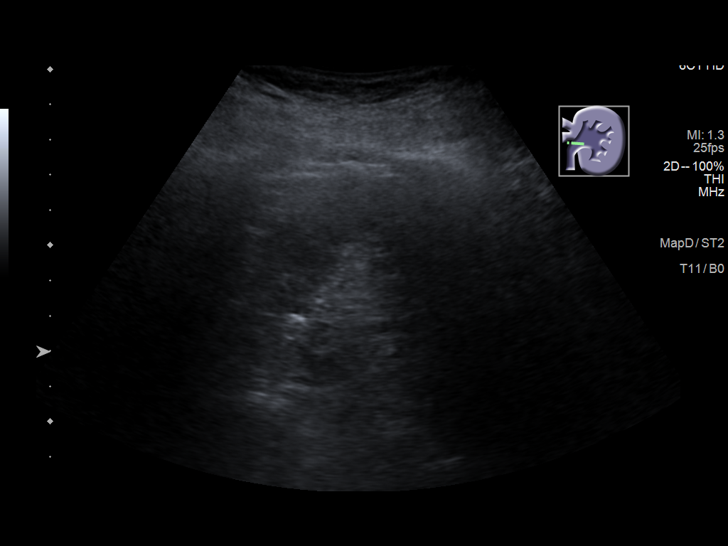
[im 27/40]
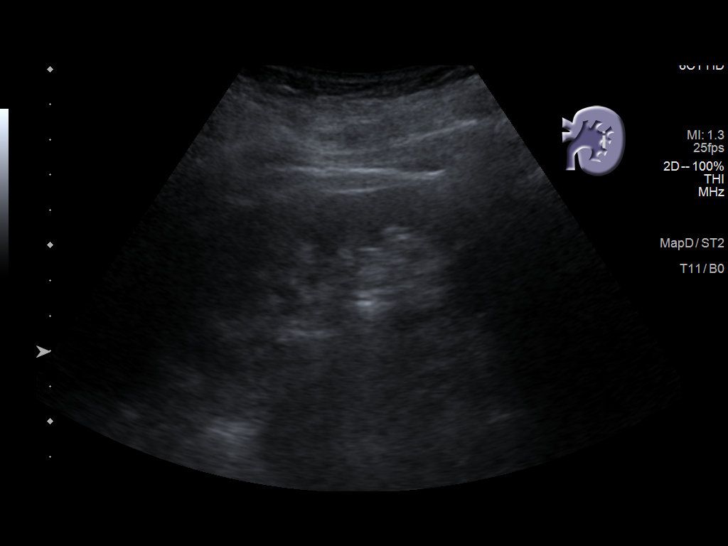
[im 30/40]
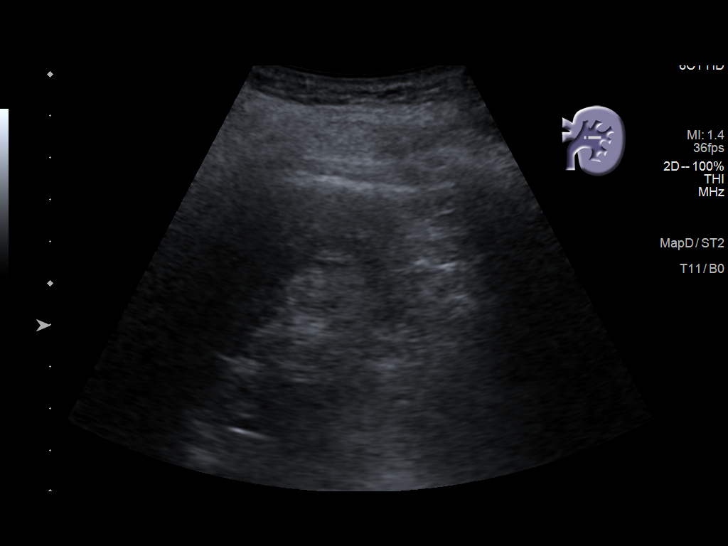
[im 33/40]
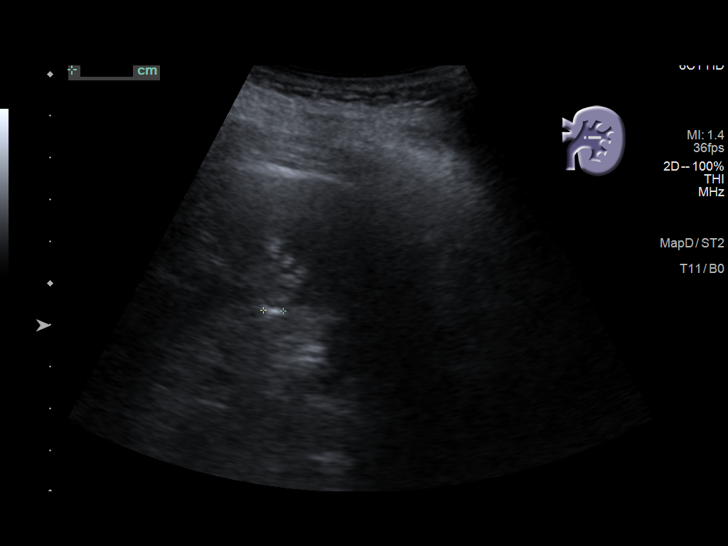
[im 36/40]
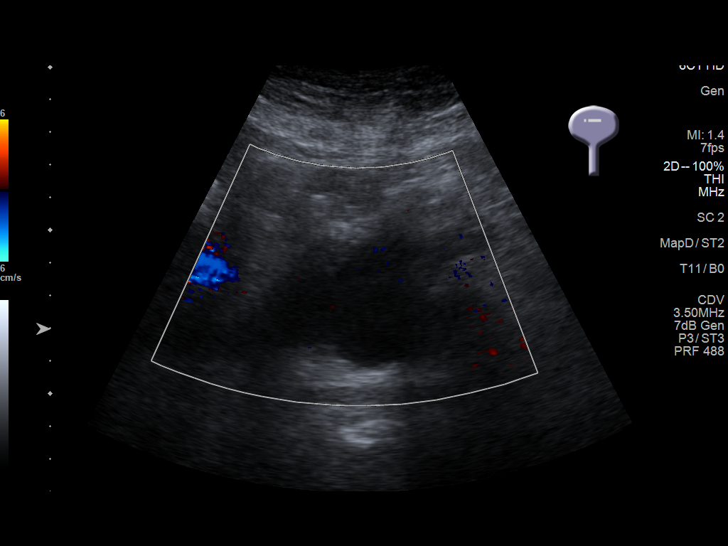
[im 40/40]
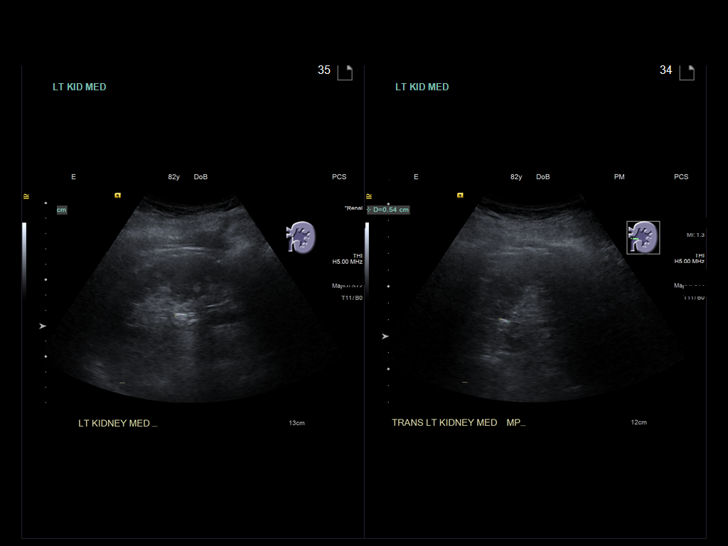

[14 of 25 positions shown; findings below may reference images not displayed]

FINDINGS: Right Kidney:

Renal measurements: 9.9 x 4.3 x 4.2 cm = volume: 94 mL. Mild
cortical thinning. Normal cortical echogenicity. No mass,
hydronephrosis or shadowing calcification.

Left Kidney:

Renal measurements: 11.5 x 6.1 x 5.3 cm = volume: 195 mL. Normal
cortical thickness and echogenicity. 7 mm nonshadowing echogenic
focus at mid kidney question artifact versus calculus. No mass or
hydronephrosis.

Bladder:

Contains only a small amount of urine, grossly unremarkable.
Ureteral jets were not visualized.
IMPRESSION: Questionable 7 mm nonshadowing calculus versus artifact at mid LEFT
kidney.

Otherwise negative exam.

## 2018-07-13 ENCOUNTER — Ambulatory Visit: Payer: Medicare Other | Admitting: Gastroenterology

## 2018-07-20 ENCOUNTER — Telehealth: Payer: Self-pay

## 2018-07-20 NOTE — Telephone Encounter (Signed)
Copied from [place]. Topic: General - Other >> Jul 20, 2018 11:34 AM Leward Quan A wrote: Reason for CRM: Patient called to say that her pharmacy contacted her losartan (COZAAR) 25 MG tablet is on back order and they are not sure when it will become available. They would like something else prescribed and patient would like a call back when done please. Ph# [phone]

## 2018-07-22 ENCOUNTER — Other Ambulatory Visit: Payer: Self-pay | Admitting: Family Medicine

## 2018-07-22 MED ORDER — TELMISARTAN 40 MG PO TABS: 40.0000 mg | ORAL_TABLET | Freq: Every day | ORAL | 0 refills | Status: DC

## 2018-07-23 NOTE — Telephone Encounter (Signed)
Patient called.  Patient aware.  

## 2018-07-24 ENCOUNTER — Inpatient Hospital Stay
Admission: EM | Admit: 2018-07-24 | Discharge: 2018-07-26 | DRG: 083 | Disposition: A | Discharge status: Home Health | Payer: Medicare Other | Attending: Internal Medicine | Admitting: Internal Medicine

## 2018-07-24 ENCOUNTER — Emergency Department: Payer: Medicare Other

## 2018-07-24 ENCOUNTER — Other Ambulatory Visit: Payer: Self-pay

## 2018-07-24 ENCOUNTER — Inpatient Hospital Stay: Payer: Medicare Other

## 2018-07-24 ENCOUNTER — Encounter: Payer: Self-pay | Admitting: Emergency Medicine

## 2018-07-24 DIAGNOSIS — E785 Hyperlipidemia, unspecified: Secondary | ICD-10-CM | POA: Diagnosis present

## 2018-07-24 DIAGNOSIS — I248 Other forms of acute ischemic heart disease: Secondary | ICD-10-CM | POA: Diagnosis present

## 2018-07-24 DIAGNOSIS — S01511A Laceration without foreign body of lip, initial encounter: Secondary | ICD-10-CM | POA: Diagnosis present

## 2018-07-24 DIAGNOSIS — Z7982 Long term (current) use of aspirin: Secondary | ICD-10-CM

## 2018-07-24 DIAGNOSIS — E860 Dehydration: Secondary | ICD-10-CM | POA: Diagnosis present

## 2018-07-24 DIAGNOSIS — W1830XA Fall on same level, unspecified, initial encounter: Secondary | ICD-10-CM | POA: Diagnosis present

## 2018-07-24 DIAGNOSIS — Z7984 Long term (current) use of oral hypoglycemic drugs: Secondary | ICD-10-CM | POA: Diagnosis not present

## 2018-07-24 DIAGNOSIS — Z79899 Other long term (current) drug therapy: Secondary | ICD-10-CM | POA: Diagnosis not present

## 2018-07-24 DIAGNOSIS — Z90710 Acquired absence of both cervix and uterus: Secondary | ICD-10-CM

## 2018-07-24 DIAGNOSIS — Y92481 Parking lot as the place of occurrence of the external cause: Secondary | ICD-10-CM

## 2018-07-24 DIAGNOSIS — Z23 Encounter for immunization: Secondary | ICD-10-CM

## 2018-07-24 DIAGNOSIS — S065X9A Traumatic subdural hemorrhage with loss of consciousness of unspecified duration, initial encounter: Principal | ICD-10-CM | POA: Diagnosis present

## 2018-07-24 DIAGNOSIS — E119 Type 2 diabetes mellitus without complications: Secondary | ICD-10-CM | POA: Diagnosis present

## 2018-07-24 DIAGNOSIS — Z1159 Encounter for screening for other viral diseases: Secondary | ICD-10-CM

## 2018-07-24 DIAGNOSIS — I10 Essential (primary) hypertension: Secondary | ICD-10-CM | POA: Diagnosis present

## 2018-07-24 DIAGNOSIS — D649 Anemia, unspecified: Secondary | ICD-10-CM | POA: Diagnosis present

## 2018-07-24 DIAGNOSIS — S0990XA Unspecified injury of head, initial encounter: Secondary | ICD-10-CM

## 2018-07-24 DIAGNOSIS — S065XAA Traumatic subdural hemorrhage with loss of consciousness status unknown, initial encounter: Secondary | ICD-10-CM

## 2018-07-24 DIAGNOSIS — E876 Hypokalemia: Secondary | ICD-10-CM | POA: Diagnosis present

## 2018-07-24 DIAGNOSIS — R55 Syncope and collapse: Secondary | ICD-10-CM | POA: Diagnosis present

## 2018-07-24 LAB — URINALYSIS, COMPLETE (UACMP) WITH MICROSCOPIC
Bacteria, UA: NONE SEEN
Bilirubin Urine: NEGATIVE
Glucose, UA: NEGATIVE mg/dL
Hgb urine dipstick: NEGATIVE
Ketones, ur: NEGATIVE mg/dL
Nitrite: NEGATIVE
Protein, ur: 30 mg/dL — AB
Specific Gravity, Urine: 1.018 (ref 1.005–1.030)
pH: 5 (ref 5.0–8.0)

## 2018-07-24 LAB — COMPREHENSIVE METABOLIC PANEL WITH GFR
ALT: 12 U/L (ref 0–44)
AST: 25 U/L (ref 15–41)
Albumin: 3.6 g/dL (ref 3.5–5.0)
Alkaline Phosphatase: 47 U/L (ref 38–126)
Anion gap: 14 (ref 5–15)
BUN: 17 mg/dL (ref 8–23)
CO2: 21 mmol/L — ABNORMAL LOW (ref 22–32)
Calcium: 9.1 mg/dL (ref 8.9–10.3)
Chloride: 106 mmol/L (ref 98–111)
Creatinine, Ser: 0.84 mg/dL (ref 0.44–1.00)
GFR calc Af Amer: >60 mL/min
GFR calc non Af Amer: >60 mL/min
Glucose, Bld: 142 mg/dL — ABNORMAL HIGH (ref 70–99)
Potassium: 4.2 mmol/L (ref 3.5–5.1)
Sodium: 141 mmol/L (ref 135–145)
Total Bilirubin: 0.7 mg/dL (ref 0.3–1.2)
Total Protein: 6.7 g/dL (ref 6.5–8.1)

## 2018-07-24 LAB — CBC WITH DIFFERENTIAL/PLATELET
Abs Immature Granulocytes: 0.02 10*3/uL (ref 0.00–0.07)
Basophils Absolute: 0 10*3/uL (ref 0.0–0.1)
Basophils Relative: 0 %
Eosinophils Absolute: 0.1 10*3/uL (ref 0.0–0.5)
Eosinophils Relative: 1 %
HCT: 35.2 % — ABNORMAL LOW (ref 36.0–46.0)
Hemoglobin: 11.2 g/dL — ABNORMAL LOW (ref 12.0–15.0)
Immature Granulocytes: 0 %
Lymphocytes Relative: 55 %
Lymphs Abs: 5.3 10*3/uL — ABNORMAL HIGH (ref 0.7–4.0)
MCH: 31.2 pg (ref 26.0–34.0)
MCHC: 31.8 g/dL (ref 30.0–36.0)
MCV: 98.1 fL (ref 80.0–100.0)
Monocytes Absolute: 0.5 10*3/uL (ref 0.1–1.0)
Monocytes Relative: 5 %
Neutro Abs: 3.7 10*3/uL (ref 1.7–7.7)
Neutrophils Relative %: 39 %
Platelets: 204 10*3/uL (ref 150–400)
RBC: 3.59 MIL/uL — ABNORMAL LOW (ref 3.87–5.11)
RDW: 15.1 % (ref 11.5–15.5)
Smear Review: NORMAL
WBC: 9.7 10*3/uL (ref 4.0–10.5)
nRBC: 0 % (ref 0.0–0.2)

## 2018-07-24 LAB — SARS CORONAVIRUS 2 BY RT PCR (HOSPITAL ORDER, PERFORMED IN ~~LOC~~ HOSPITAL LAB): SARS Coronavirus 2: NEGATIVE

## 2018-07-24 LAB — GLUCOSE, CAPILLARY: Glucose-Capillary: 108 mg/dL — ABNORMAL HIGH (ref 70–99)

## 2018-07-24 LAB — TROPONIN I (HIGH SENSITIVITY)
Troponin I (High Sensitivity): 25 ng/L — ABNORMAL HIGH (ref <18)
Troponin I (High Sensitivity): 32 ng/L — ABNORMAL HIGH (ref <18)

## 2018-07-24 IMAGING — CT CT HEAD WITHOUT CONTRAST
4 of 10 series · 15 of 47 positions shown, 17 images · non-contrast
Comparison: None.

CLINICAL DATA: 84-year-old who fell earlier today and has RIGHT
facial swelling and neck pain. Laceration to the lip. Patient
amnestic to the event. Initial encounter.

EXAM:
CT HEAD WITHOUT CONTRAST
CT MAXILLOFACIAL WITHOUT CONTRAST
CT CERVICAL SPINE WITHOUT CONTRAST
TECHNIQUE: Multidetector CT imaging of the head, cervical spine, and
maxillofacial structures were performed using the standard protocol
without intravenous contrast. Multiplanar CT image reconstructions
of the cervical spine and maxillofacial structures were also
generated. A metallic BB was placed on the right temple in order to
reliably differentiate right from left.

[Series 11: orthogonal axials · axial · 0.23mm/px · z∈[-298,-174]mm · 8 of 97 slices shown, 10 images]
[im 11/97  brain]
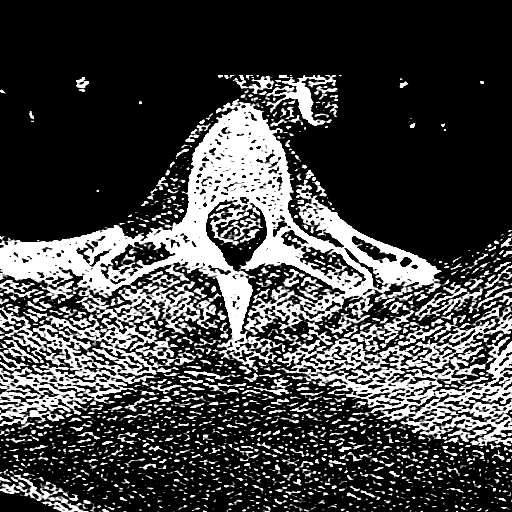
[im 11/97  bone]
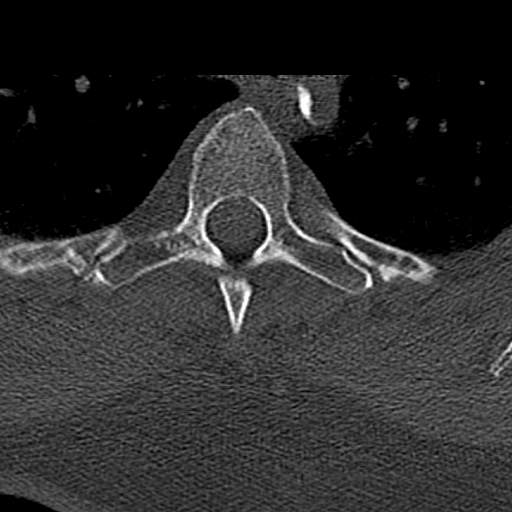
[im 22/97  brain]
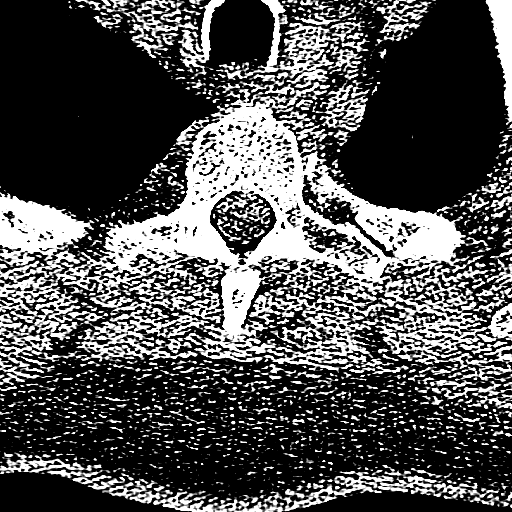
[im 33/97  brain]
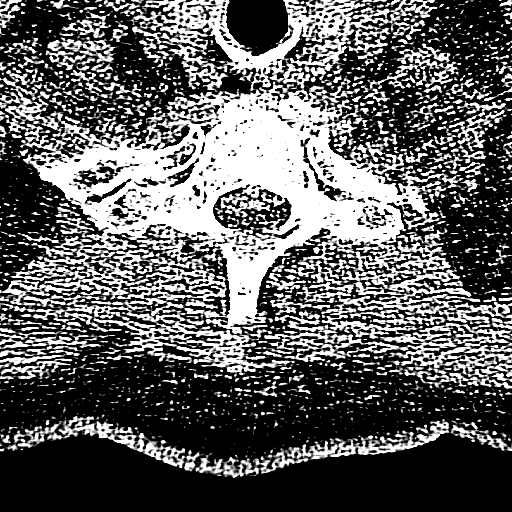
[im 43/97  brain]
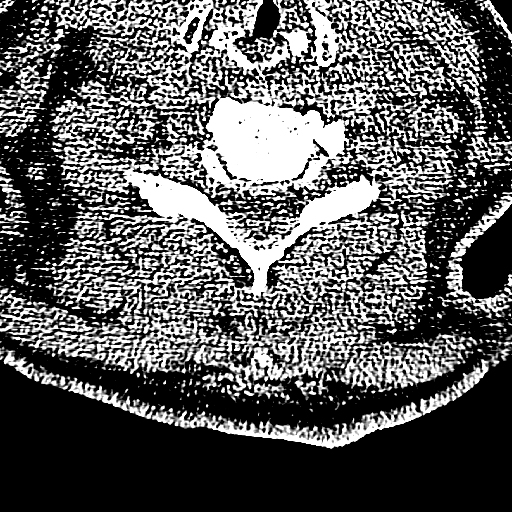
[im 54/97  brain]
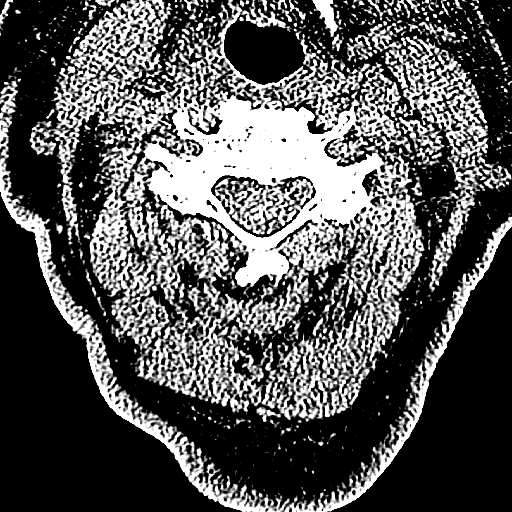
[im 54/97  bone]
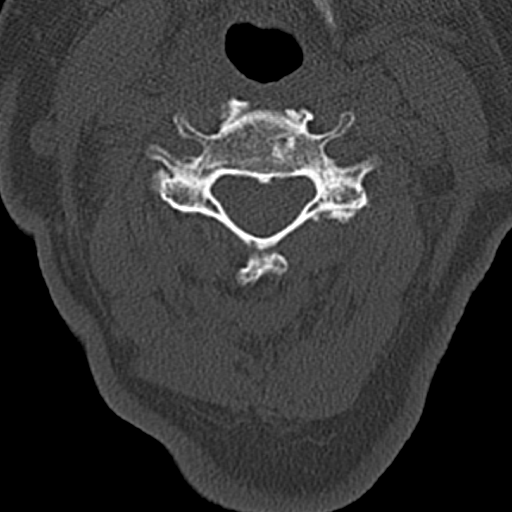
[im 65/97  brain]
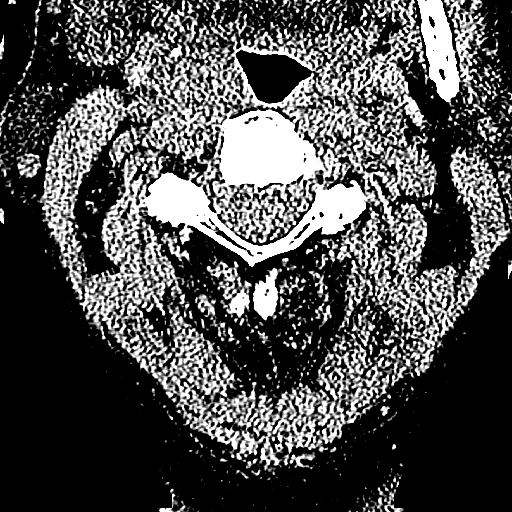
[im 75/97  brain]
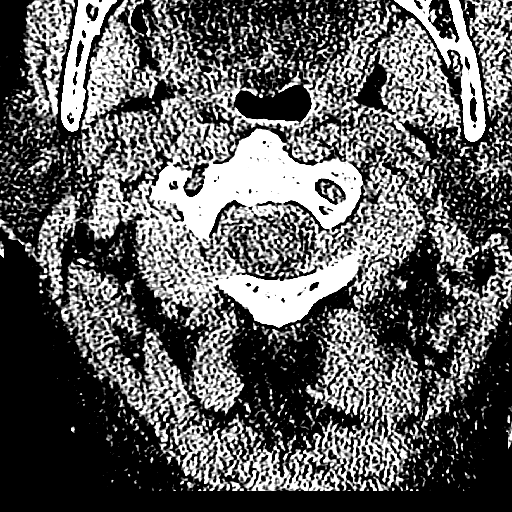
[im 86/97  brain]
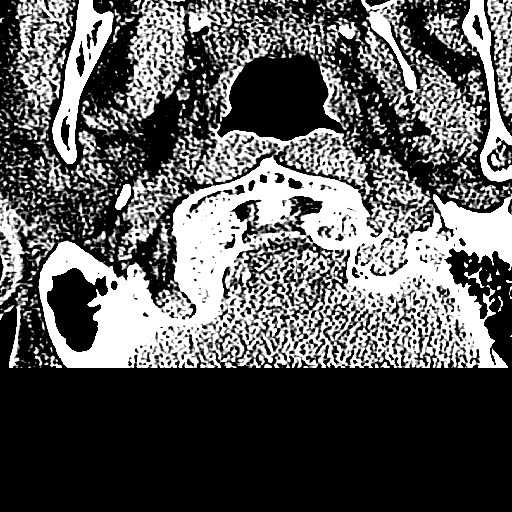

[Series 12: max soft · axial · 0.33mm/px · z∈[-238,-196]mm · 3 of 86 slices shown]
[im 11/86  brain]
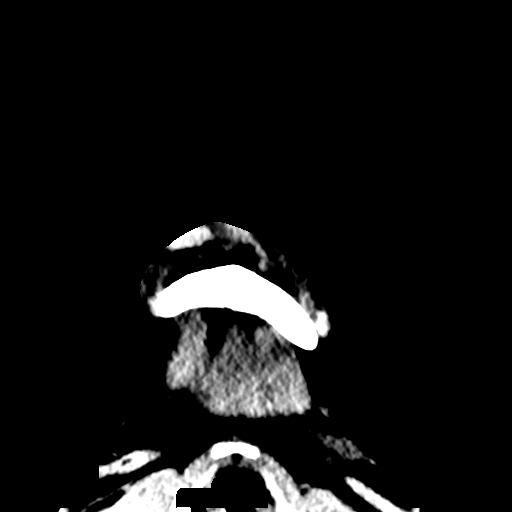
[im 22/86  brain]
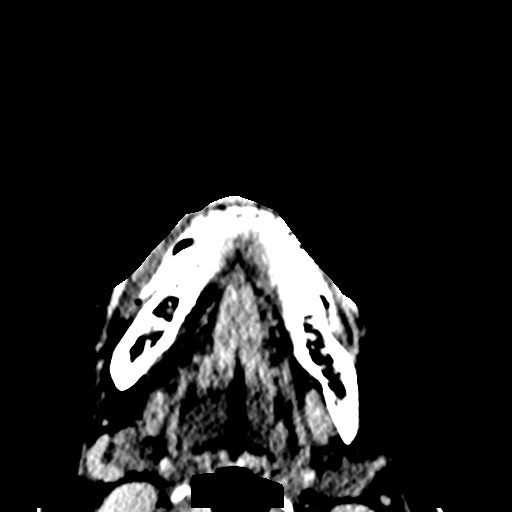
[im 32/86  brain]
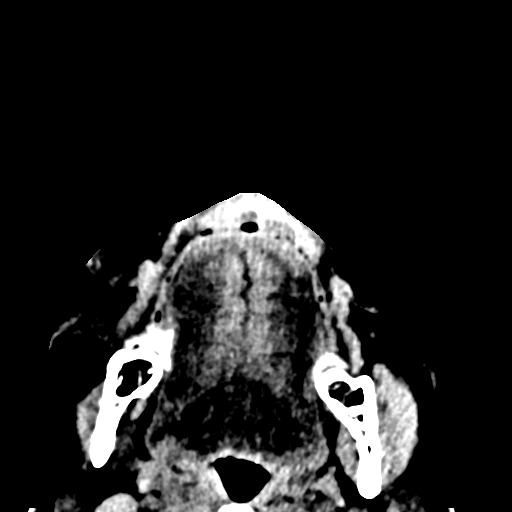

[Series 16: coronal soft · coronal · 0.38mm/px · 3 of 79 slices shown]
[im 20/79  brain]
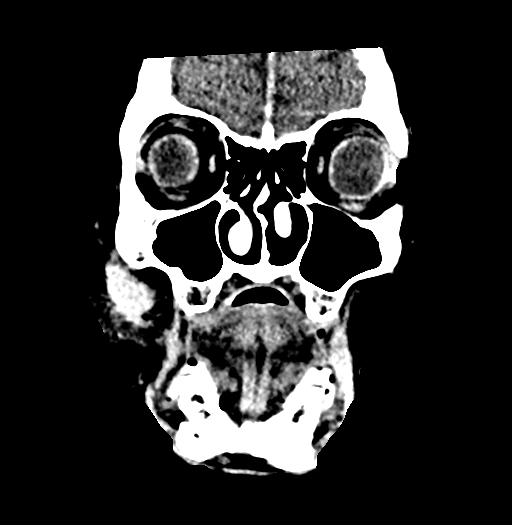
[im 40/79  brain]
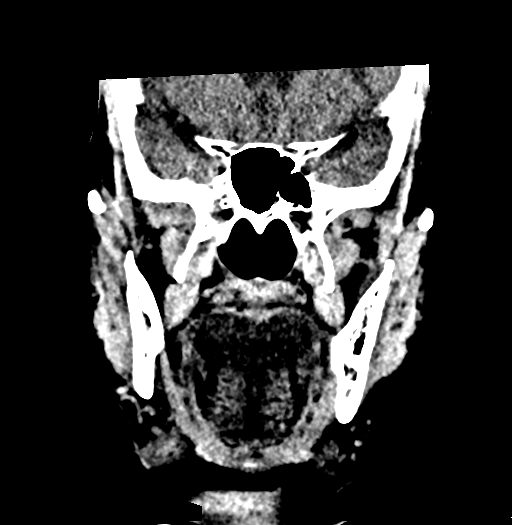
[im 59/79  brain]
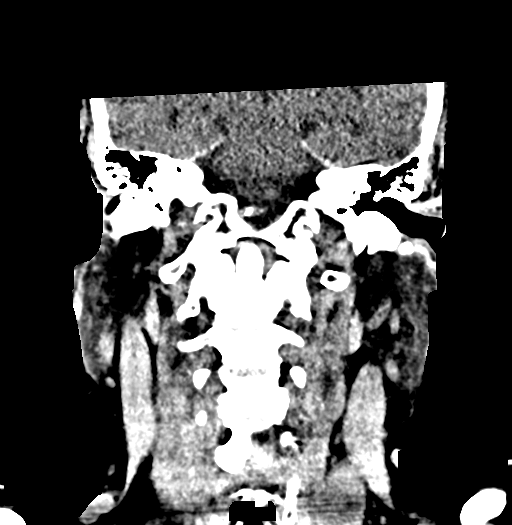

[Series 17: sagittal soft · sagittal · 0.37mm/px · 1 of 80 slices shown]
[im 40/80  brain]
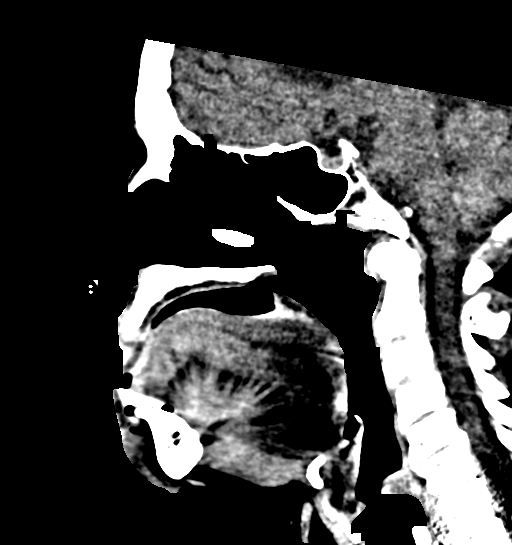

[15 of 47 positions shown; findings below may reference images not displayed]

FINDINGS: CT HEAD FINDINGS

Brain: RIGHT parafalcine subdural hematoma extending upward to near
the vertex, maximum thickness approximately 7 mm, without associated
mass effect or midline shift. No evidence of parenchymal hemorrhage
or hematoma. No evidence of subarachnoid hemorrhage or
intraventricular hemorrhage.

Ventricular system normal in size and appearance for age. Mild
age-appropriate cortical atrophy. Moderate to severe changes of
small vessel disease of the white matter diffusely.

Vascular: Severe BILATERAL carotid siphon and vertebrobasilar
atherosclerosis. No hyperdense vessel.

Skull: No skull fracture or other focal osseous abnormality
involving the skull.

Other: None.

CT MAXILLOFACIAL FINDINGS

Osseous: No fractures identified involving the facial bones.
Temporomandibular joints anatomically aligned without significant
degenerative change. Osseous demineralization. Edentulous.

Orbits: No orbital fractures. No evidence of orbital hemorrhage.
Extraocular muscles normal in appearance throughout. Hyperdensity in
both lens, query cataracts. Senile calcification involving the RIGHT
globe.

Sinuses: Opacification of a solitary LEFT POSTERIOR ethmoid air
cell. Paranasal sinuses otherwise well aerated throughout. BILATERAL
middle ear cavities and BILATERAL mastoid air cells well-aerated.
Bony nasal septal deviation to the RIGHT.

Soft tissues: Hematoma in the subcutaneous tissues of the RIGHT
cheek measuring approximately 2.7 x 1.7 cm. Small hematoma involving
the RIGHT side of the cheek in measuring approximately 0.8 x 1.3 cm.

CT CERVICAL SPINE FINDINGS

Alignment: Anatomic POSTERIOR alignment. Straightening of the usual
lordosis. Facet joints anatomically aligned throughout with diffuse
degenerative changes.

Skull base and vertebrae: No fractures identified involving the
cervical spine. Coronal reformatted images demonstrate an intact
craniocervical junction, intact dens and intact lateral masses
throughout. Benign bone island in the LEFT lateral arch of C1.

Soft tissues and spinal canal: No evidence of paraspinous or spinal
canal hematoma. No evidence of spinal stenosis.

Disc levels: Moderate disc space narrowing at C5-6 and mild disc
space narrowing at C4-5. No visible disc protrusions or extrusions
on the soft tissue window images. Combination of facet and uncinate
hypertrophy account for multilevel foraminal stenoses including mild
RIGHT C2-3, mild BILATERAL C3-4, severe BILATERAL C4-5, moderate
BILATERAL C5-6.

Upper chest: Visualized lung apices clear. Mild atherosclerosis
involving the visualized great vessels.

Other: Mild BILATERAL cervical carotid atherosclerosis.
IMPRESSION: 1. Small RIGHT parafalcine subdural hematoma, maximum thickness 7
mm, without associated mass effect or midline shift. No evidence of
hemorrhage or hematoma elsewhere.
2. No acute intracranial abnormalities otherwise.
3. Mild age-appropriate cortical atrophy and moderate to severe
chronic microvascular ischemic changes of the white matter.
4. No facial bone fractures identified.
5. Hematoma in the subcutaneous tissues of the RIGHT cheek and in
the RIGHT side of the cheek.
6. No cervical spine fractures identified.
7. Multilevel degenerative disc disease, spondylosis and facet
degenerative changes with multilevel foraminal stenoses as detailed
above.

I telephoned these results at the time of interpretation on
[DATE] at [DATE] to Dr. TIGER, who verbally
acknowledged these results.

## 2018-07-24 IMAGING — CT CT HEAD WITHOUT CONTRAST
3 series · 16 of 46 positions shown, 19 images · non-contrast
Comparison: Study at [LL] hours compared to [LL] hours

CLINICAL DATA: Known subdural hematoma, follow-up, fell in parking
lot

EXAM:
CT HEAD WITHOUT CONTRAST
TECHNIQUE: Contiguous axial images were obtained from the base of the skull
through the vertex without intravenous contrast. Sagittal and
coronal MPR images reconstructed from axial data set.

[Series 2: head wo · axial · 0.45mm/px · z∈[-134,-14]mm · 10 of 29 slices shown, 13 images]
[im 3/29  brain]
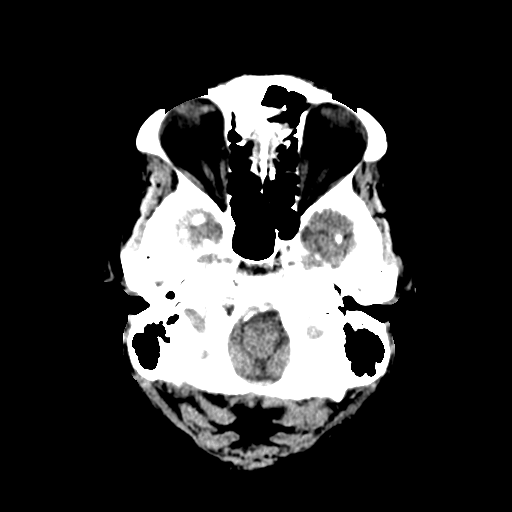
[im 3/29  bone]
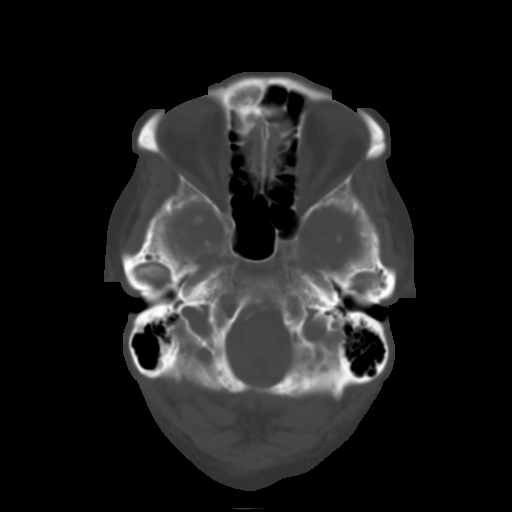
[im 6/29  brain]
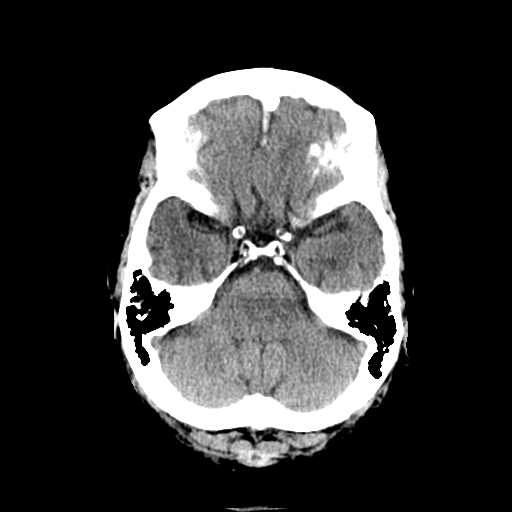
[im 8/29  brain]
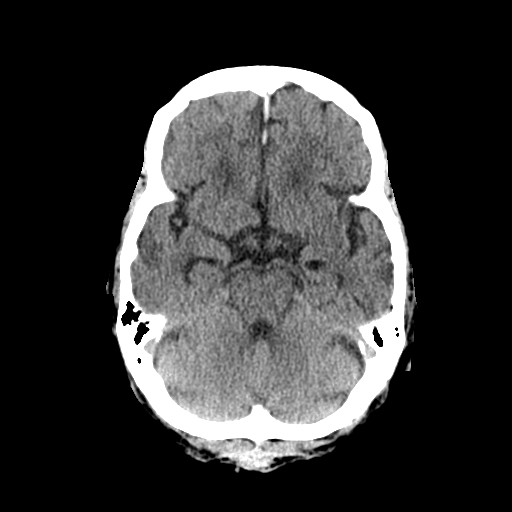
[im 11/29  brain]
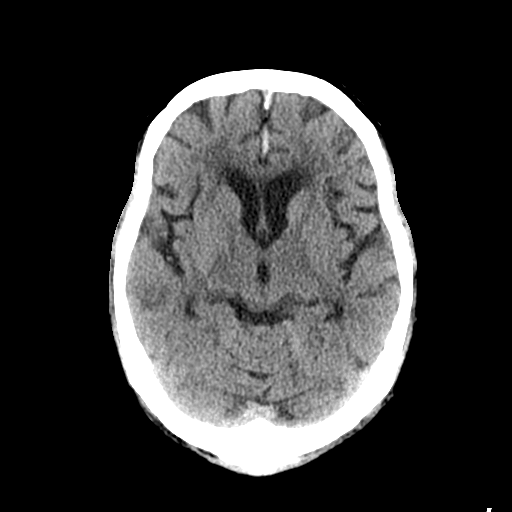
[im 14/29  brain]
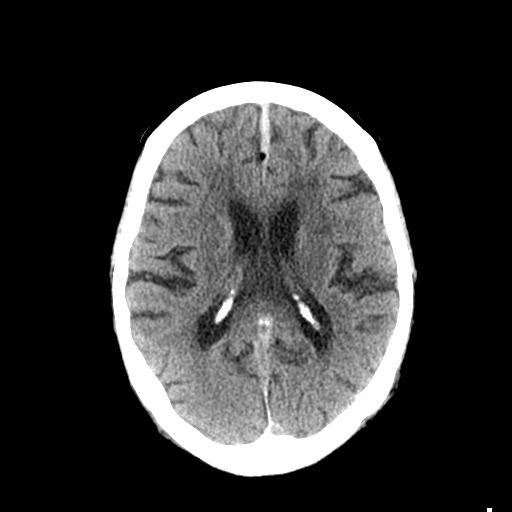
[im 14/29  bone]
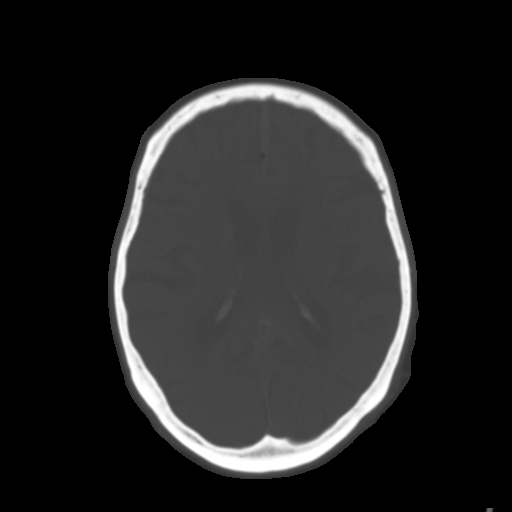
[im 16/29  brain]
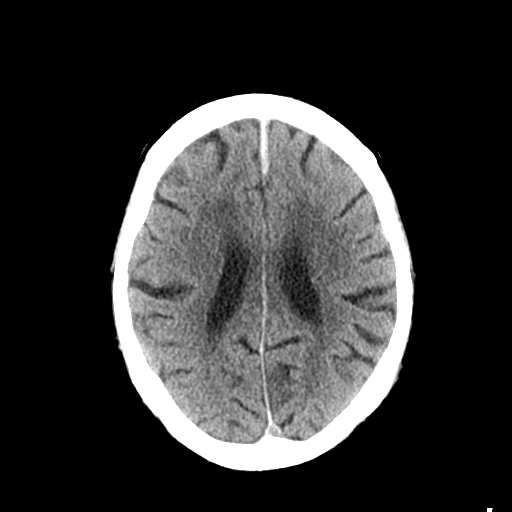
[im 19/29  brain]
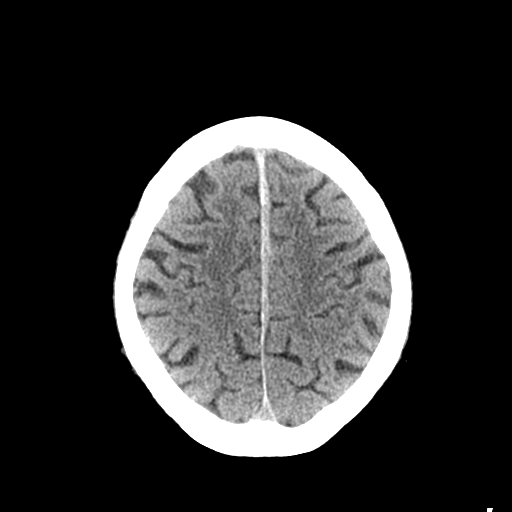
[im 22/29  brain]
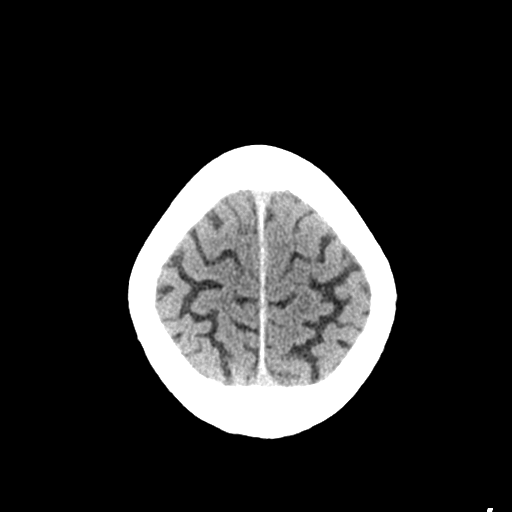
[im 24/29  brain]
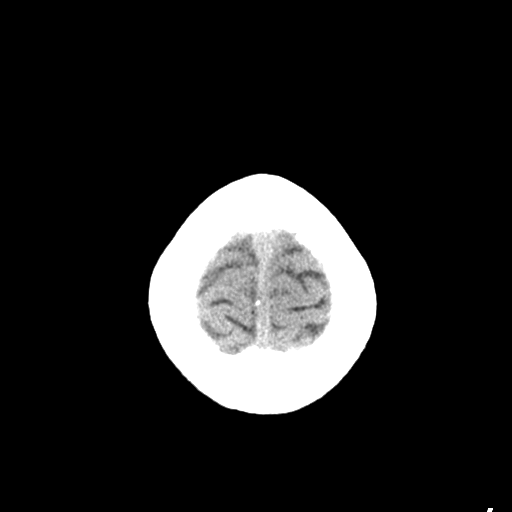
[im 24/29  bone]
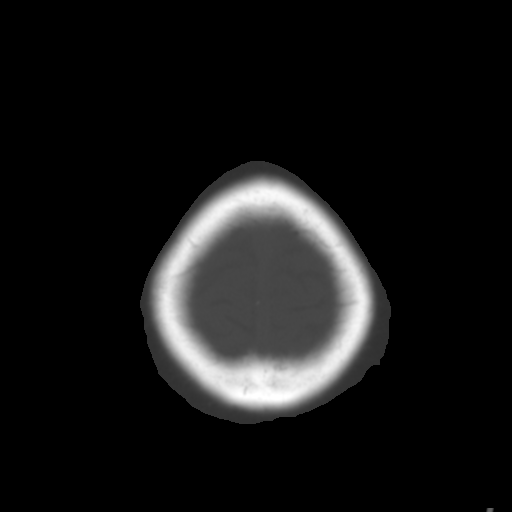
[im 27/29  brain]
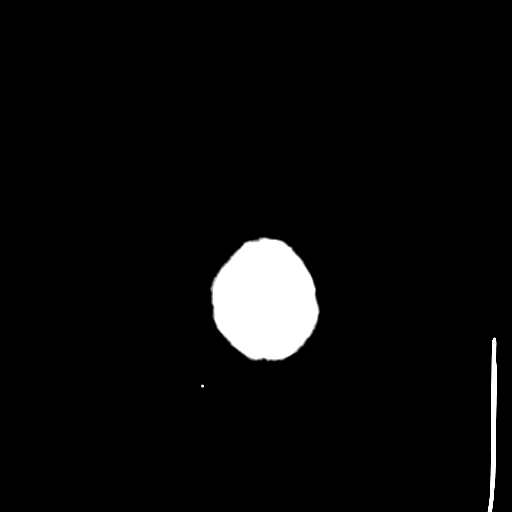

[Series 4: coronal soft tissue · coronal · 0.31mm/px · 3 of 64 slices shown]
[im 22/64  brain]
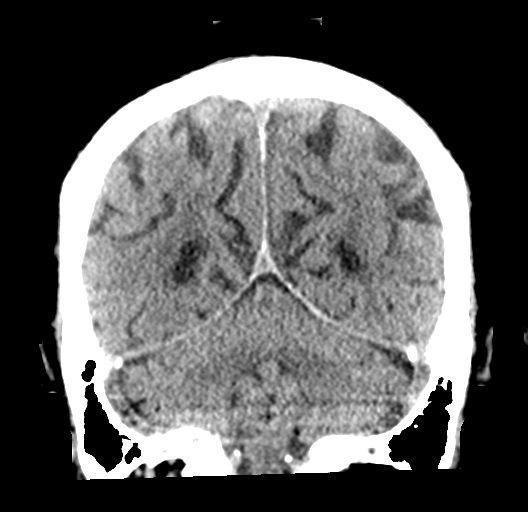
[im 29/64  brain]
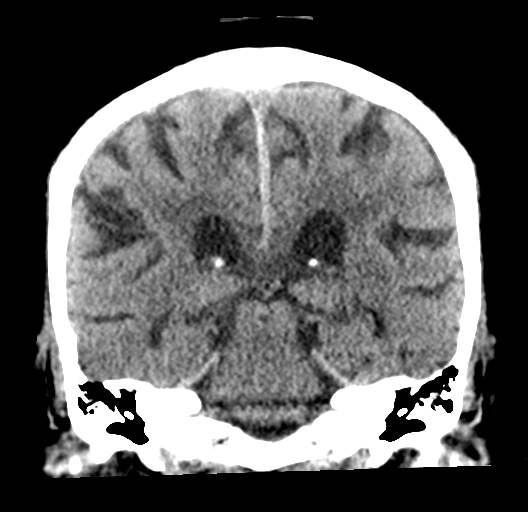
[im 36/64  brain]
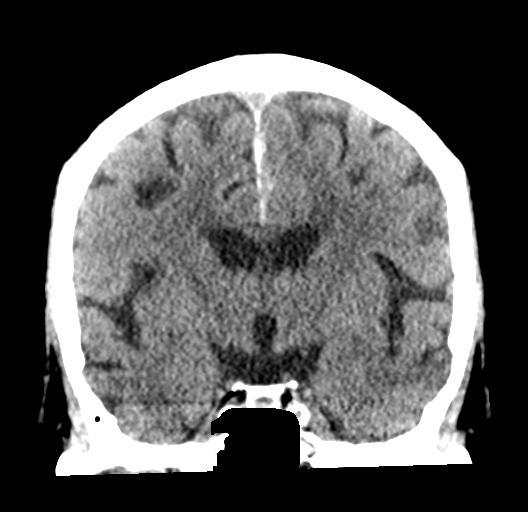

[Series 5: sagittal soft tissue · sagittal · 0.31mm/px · 3 of 51 slices shown]
[im 17/51  brain]
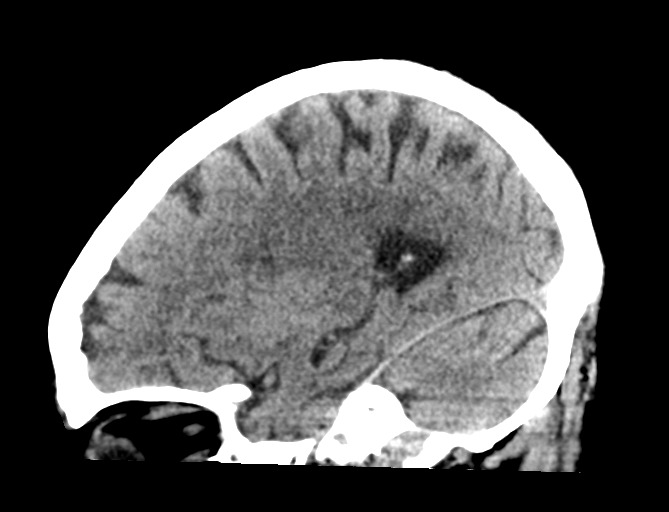
[im 26/51  brain]
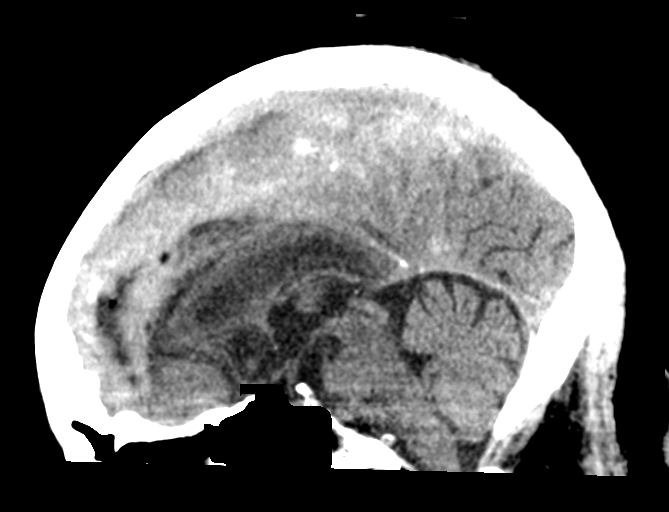
[im 34/51  brain]
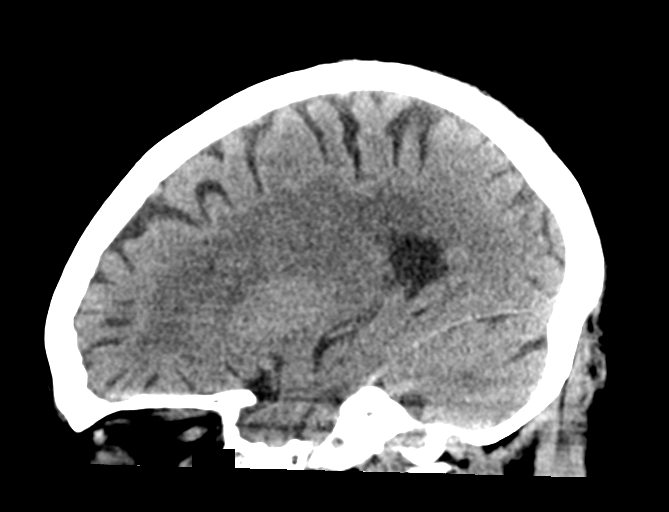

[16 of 46 positions shown; findings below may reference images not displayed]

FINDINGS: Brain: Mild atrophy. Normal ventricular morphology. No midline shift
or mass effect. Small vessel chronic ischemic changes of deep
cerebral white matter. Again identified parafalcine subdural
hematoma up to 6 mm thick, previously 7 mm. No new areas of
hemorrhage or infarction. No mass lesion.

Vascular: Atherosclerotic calcification of internal carotid arteries
bilaterally at skull base

Skull: Intact

Sinuses/Orbits: Clear

Other: N/A
IMPRESSION: Stable appearance of a small parafalcine subdural hematoma measuring
6 mm thick.

No midline shift.

Underlying atrophy and small vessel chronic ischemic changes of deep
cerebral white matter.

No new intracranial abnormalities.

## 2018-07-24 IMAGING — MR MRI HEAD WITHOUT AND WITH CONTRAST
14 series · 48 of 48 positions shown · IV contrast (gadavist)
Comparison: Head CT [DATE]

CLINICAL DATA: Syncope.  Small acute subdural hematoma.

EXAM:
MRI HEAD WITHOUT AND WITH CONTRAST
TECHNIQUE: Multiplanar, multiecho pulse sequences of the brain and surrounding
structures were obtained without and with intravenous contrast.
CONTRAST:  9 mL Gadavist

[Series 5: ax dwi_tracew · axial · 3.0mm · 0.60mm/px · z∈[-61,+88]mm · 4 of 48 slices shown]
[im 1/48]
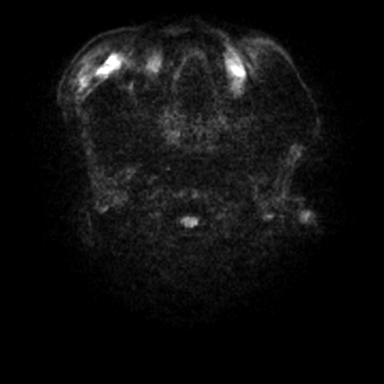
[im 16/48]
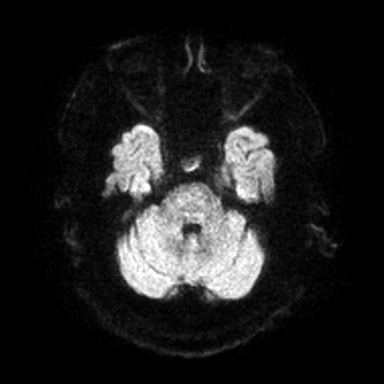
[im 32/48]
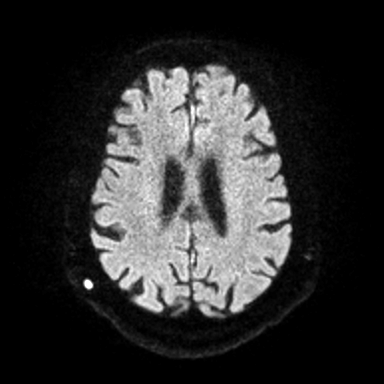
[im 48/48]
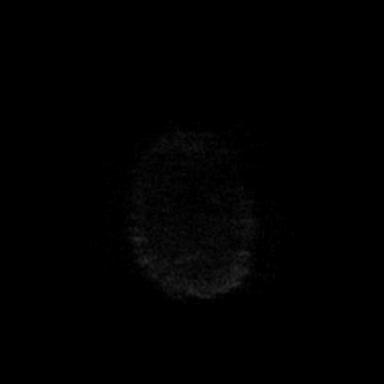

[Series 6: ax dwi_adc · axial · 3.0mm · 0.60mm/px · z∈[-61,+88]mm · 3 of 48 slices shown]
[im 1/48]
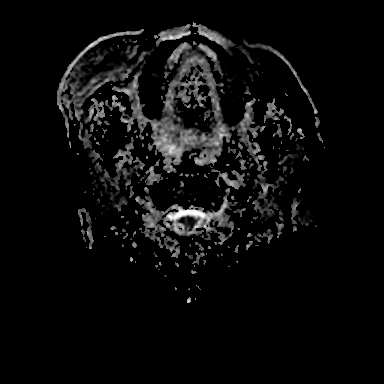
[im 24/48]
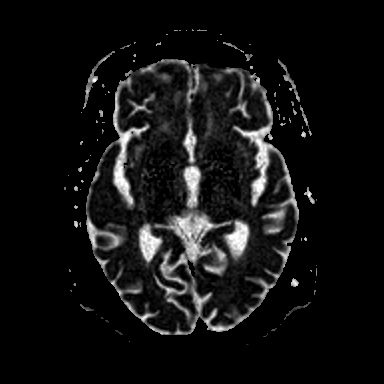
[im 48/48]
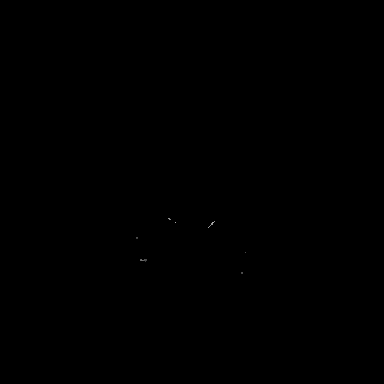

[Series 7: cor dwi_tracew · coronal · 5.0mm · 1.31mm/px · 2 of 38 slices shown]
[im 1/38]
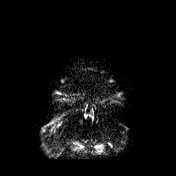
[im 38/38]
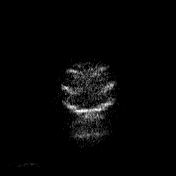

[Series 8: cor dwi_adc · coronal · 5.0mm · 1.31mm/px · 2 of 38 slices shown]
[im 1/38]
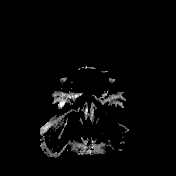
[im 38/38]
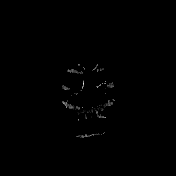

[Series 9: T1 · sagittal · 5.0mm · 0.62mm/px · 1 of 25 slices shown (1 of 3)]
[im 1/25]
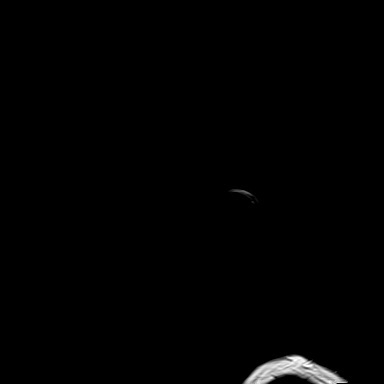

[Series 10: T2 · axial · 5.0mm · 0.53mm/px · z∈[-57,+93]mm · 2 of 27 slices shown]
[im 1/27]
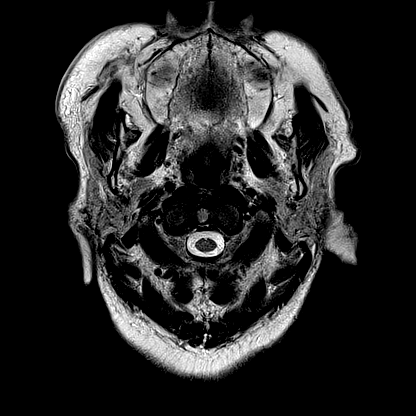
[im 27/27]
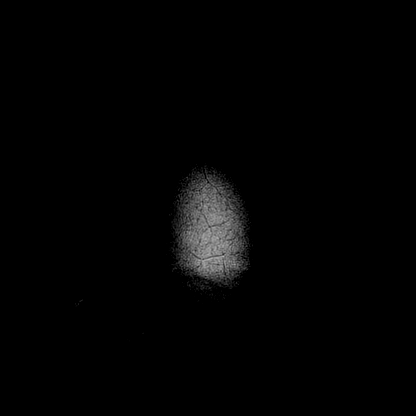

[Series 12: pha_images · axial · 3.0mm · 0.90mm/px · z∈[-55,+90]mm · 3 of 51 slices shown]
[im 1/51]
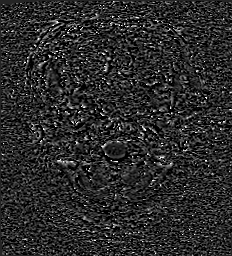
[im 26/51]
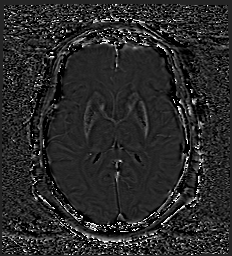
[im 51/51]
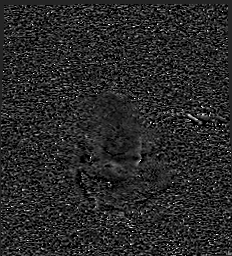

[Series 13: swi_images · axial · 3.0mm · 0.90mm/px · z∈[-58,+90]mm · 3 of 52 slices shown]
[im 1/52]
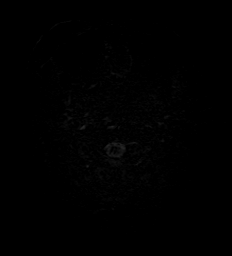
[im 26/52]
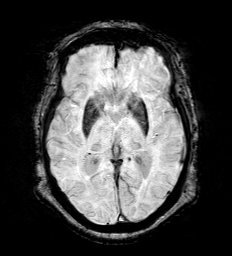
[im 52/52]
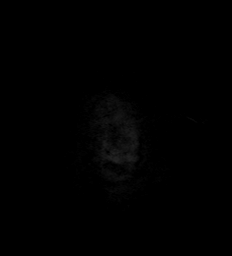

[Series 15: FLAIR · axial · 3.0mm · 0.53mm/px · z∈[-60,+96]mm · 3 of 55 slices shown]
[im 1/55]
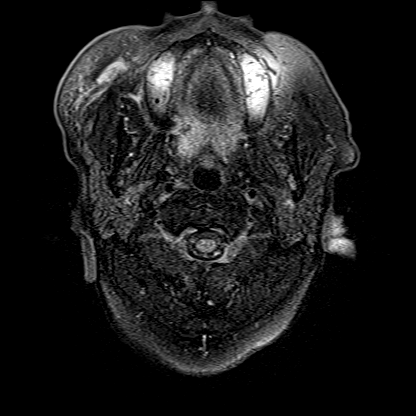
[im 28/55]
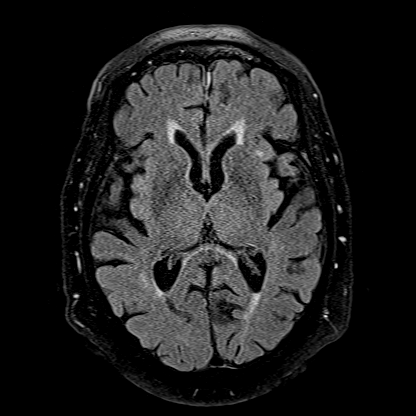
[im 55/55]
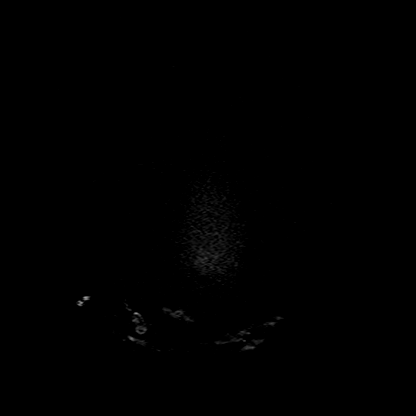

[Series 16: T1 · axial · 1.0mm · 0.98mm/px · z∈[-65,+103]mm · 10 of 176 slices shown (2 of 3)]
[im 1/176]
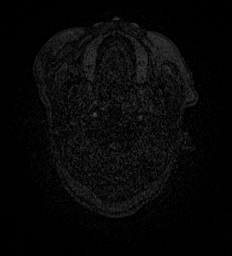
[im 20/176]
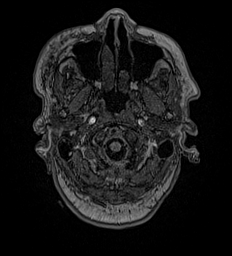
[im 39/176]
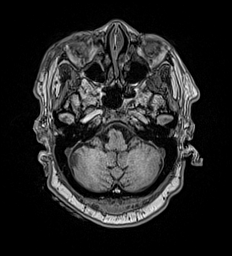
[im 59/176]
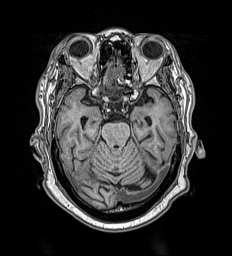
[im 78/176]
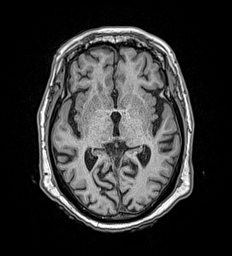
[im 98/176]
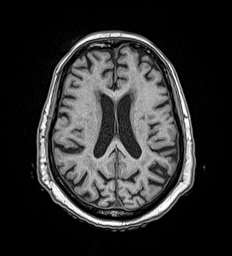
[im 117/176]
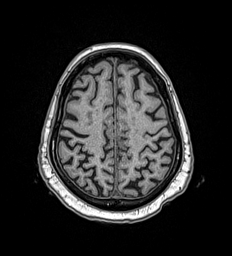
[im 137/176]
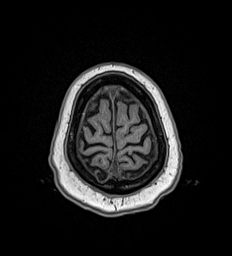
[im 156/176]
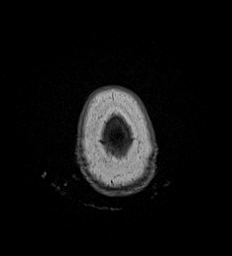
[im 176/176]
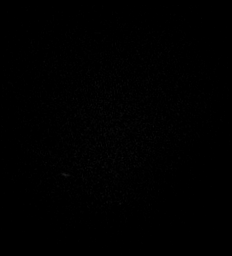

[Series 17: T2 post-contrast · coronal · 5.0mm · 0.57mm/px · 2 of 31 slices shown]
[im 1/31]
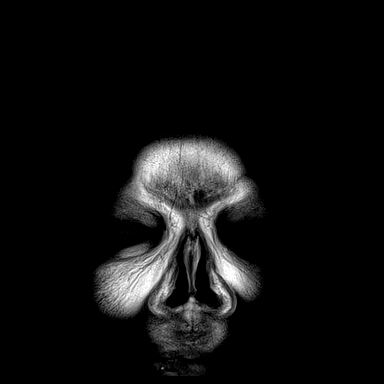
[im 31/31]
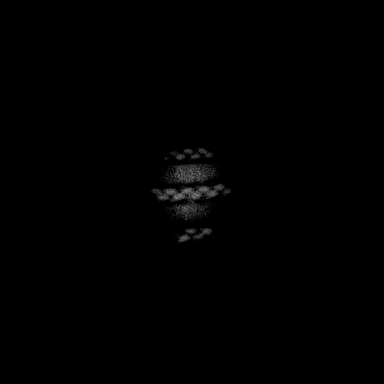

[Series 18: T1 post-contrast · axial · 1.0mm · 0.98mm/px · z∈[-65,+103]mm · 10 of 175 slices shown (1 of 2)]
[im 1/175]
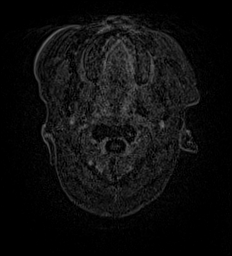
[im 20/175]
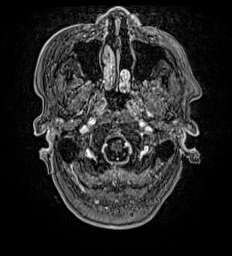
[im 39/175]
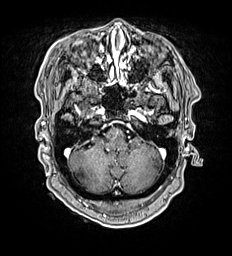
[im 59/175]
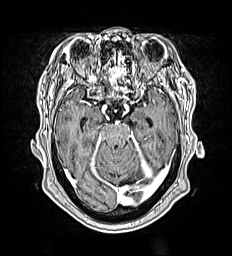
[im 78/175]
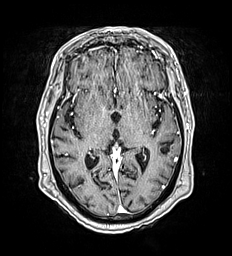
[im 97/175]
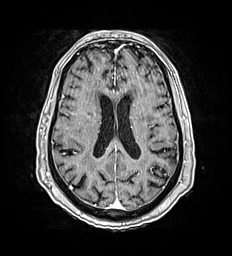
[im 117/175]
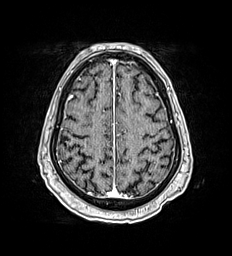
[im 136/175]
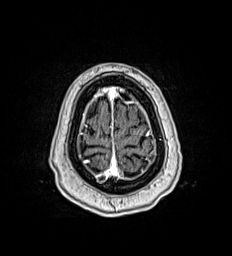
[im 155/175]
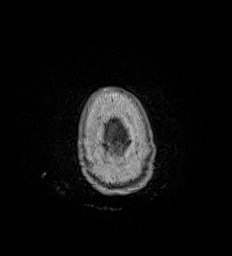
[im 175/175]
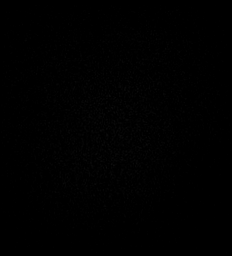

[Series 19: T1 post-contrast · coronal · 5.0mm · 0.57mm/px · 2 of 31 slices shown (2 of 2)]
[im 1/31]
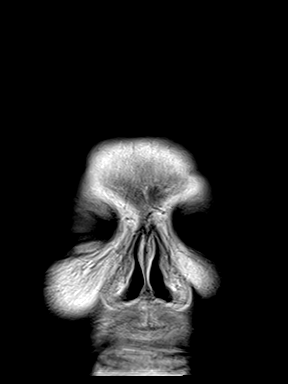
[im 31/31]
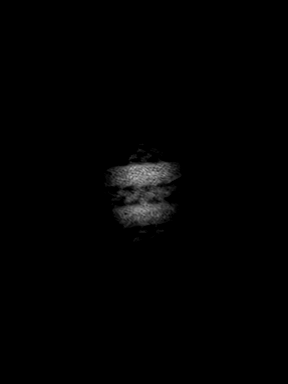

[Series 20: T1 · sagittal · 5.0mm · 0.62mm/px · 1 of 25 slices shown (3 of 3)]
[im 1/25]
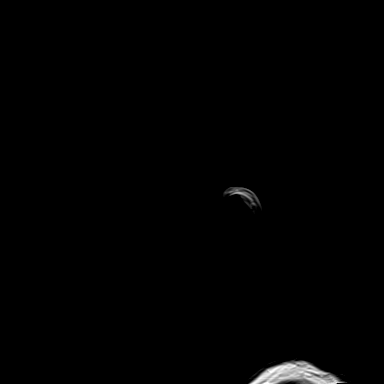

[48 of 48 positions shown; findings below may reference images not displayed]

FINDINGS: Brain: There is a thin subdural hematoma along the length of the
falx cerebri. No midline shift or other mass effect. No acute
ischemia. There is early confluent hyperintensity within the
periventricular white matter, consistent with chronic ischemic
microangiopathy. There is a planum sphenoidale meningioma that
measures 2.8 x 2.8 x 1.4 cm. There is mild hyperintense T2-weighted
signal within the overlying brain. There is mild contrast
enhancement within the pons with magnetic susceptibility effect,
likely capillary telangiectasia.

Vascular: The major intracranial arterial and venous sinus flow
voids are preserved.

Skull and upper cervical spine: The visualized skull base,
calvarium, upper cervical spine and extracranial soft tissues are
normal.

Sinuses/Orbits: No fluid levels or advanced mucosal thickening of
the visualized paranasal sinuses. No mastoid or middle ear effusion.
The orbits are normal.

Other: None
IMPRESSION: 1. Thin parafalcine subdural hematoma, unchanged compared to the
earlier CT.
2. Planum sphenoidale meningioma measuring 2.8 x 2.8 x 1.4 cm.

## 2018-07-24 IMAGING — CR PORTABLE CHEST - 1 VIEW
1 series · 1 of 1 positions shown · non-contrast
Comparison: [DATE]

CLINICAL DATA: Syncope.

EXAM:
PORTABLE CHEST 1 VIEW

[x chest ap]
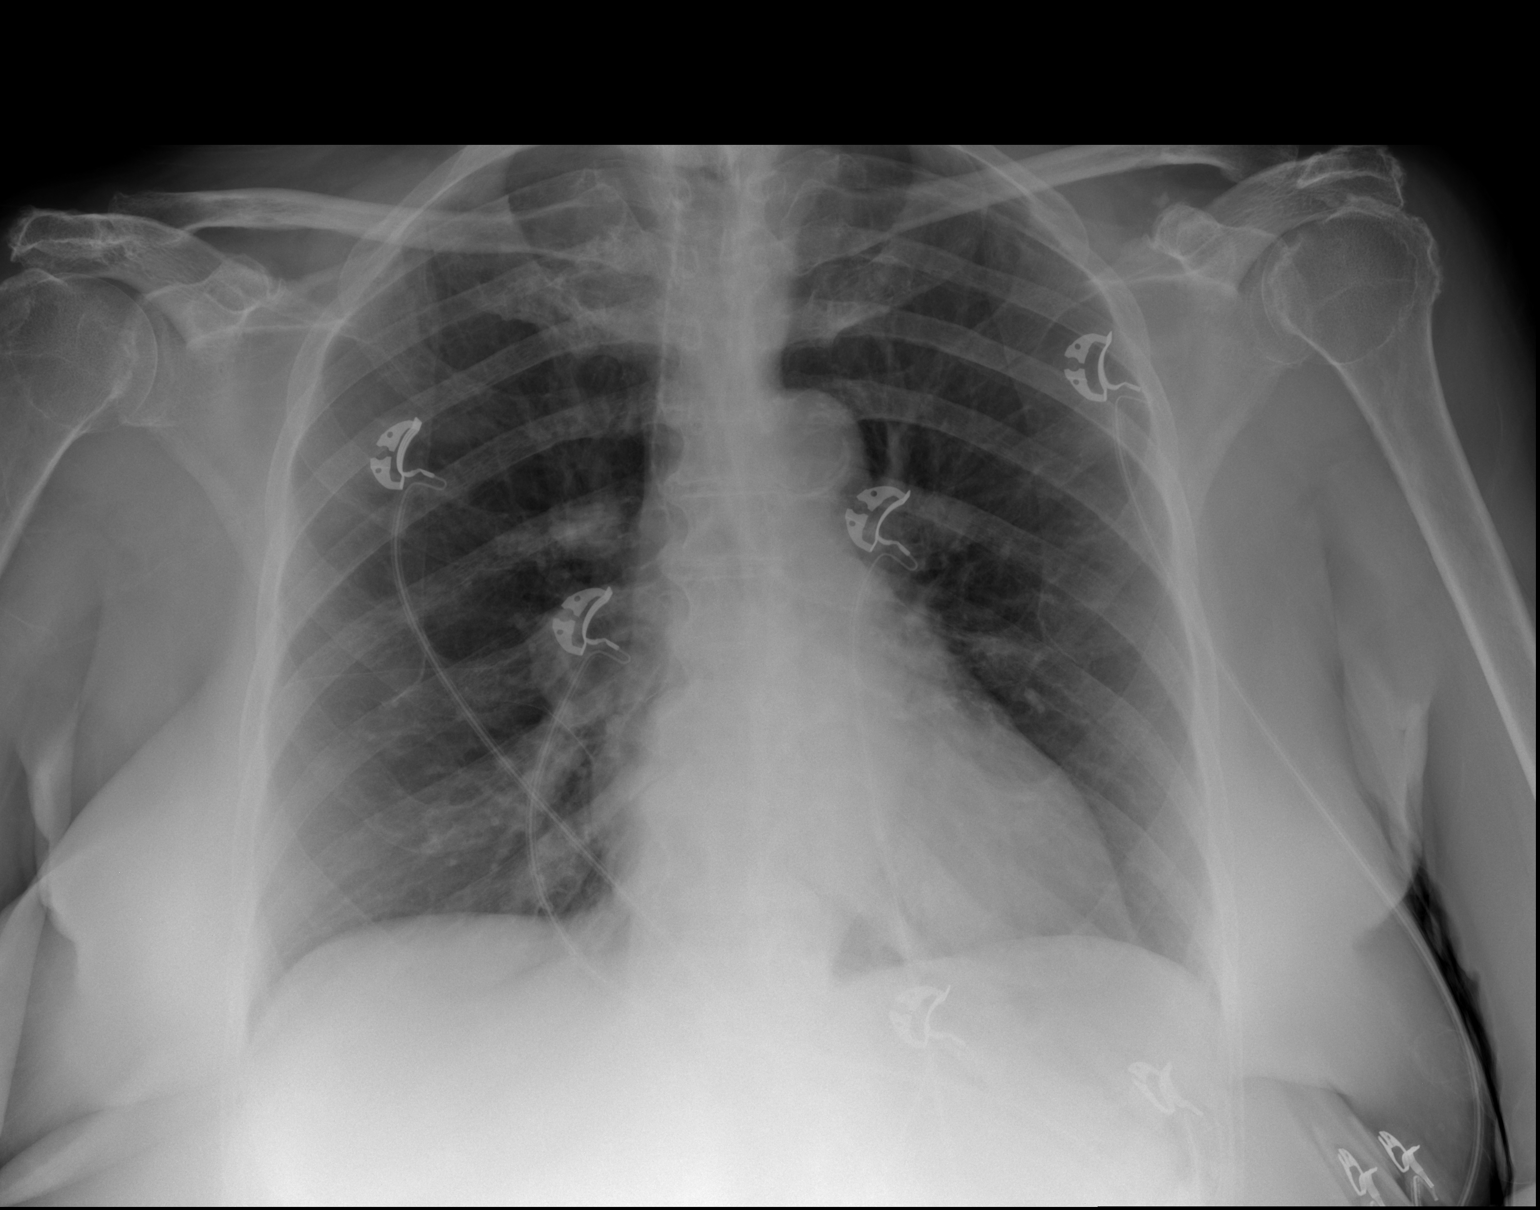

[1 of 1 positions shown; findings below may reference images not displayed]

FINDINGS: The heart size and mediastinal contours are within normal limits.
Both lungs are clear. Chronic degenerative changes of both
shoulders.

Aortic atherosclerosis.
IMPRESSION: 1. No acute cardiopulmonary disease.
2. Aortic atherosclerosis.

## 2018-07-24 MED ORDER — FERROUS SULFATE 325 (65 FE) MG PO TABS
325.0000 mg | ORAL_TABLET | Freq: Two times a day (BID) | ORAL | Status: DC
  Administered 2018-07-25 – 2018-07-26 (×3): 325 mg via ORAL
  Filled 2018-07-24 (×3): qty 1

## 2018-07-24 MED ORDER — POLYETHYLENE GLYCOL 3350 17 G PO PACK: 17.0000 g | PACK | Freq: Every day | ORAL | Status: DC | PRN

## 2018-07-24 MED ORDER — METFORMIN HCL 500 MG PO TABS
1000.0000 mg | ORAL_TABLET | Freq: Two times a day (BID) | ORAL | Status: DC
  Administered 2018-07-25 (×2): 1000 mg via ORAL
  Filled 2018-07-24 (×4): qty 2

## 2018-07-24 MED ORDER — SODIUM CHLORIDE 0.9 % IV SOLN
INTRAVENOUS | Status: DC
  Administered 2018-07-24 – 2018-07-26 (×3): via INTRAVENOUS

## 2018-07-24 MED ORDER — SODIUM CHLORIDE 0.9 % IV BOLUS
500.0000 mL | Freq: Once | INTRAVENOUS | Status: AC
  Administered 2018-07-24: 500 mL via INTRAVENOUS

## 2018-07-24 MED ORDER — ACETAMINOPHEN 325 MG PO TABS
650.0000 mg | ORAL_TABLET | Freq: Four times a day (QID) | ORAL | Status: DC | PRN
  Administered 2018-07-24: 650 mg via ORAL
  Filled 2018-07-24: qty 2

## 2018-07-24 MED ORDER — INSULIN ASPART 100 UNIT/ML ~~LOC~~ SOLN: 0.0000 [IU] | Freq: Every day | SUBCUTANEOUS | Status: DC

## 2018-07-24 MED ORDER — REPAGLINIDE 1 MG PO TABS
2.0000 mg | ORAL_TABLET | Freq: Three times a day (TID) | ORAL | Status: DC
  Administered 2018-07-25 – 2018-07-26 (×3): 2 mg via ORAL
  Filled 2018-07-24 (×7): qty 2

## 2018-07-24 MED ORDER — ONDANSETRON HCL 4 MG PO TABS: 4.0000 mg | ORAL_TABLET | Freq: Four times a day (QID) | ORAL | Status: DC | PRN

## 2018-07-24 MED ORDER — ACETAMINOPHEN 650 MG RE SUPP: 650.0000 mg | Freq: Four times a day (QID) | RECTAL | Status: DC | PRN

## 2018-07-24 MED ORDER — INSULIN ASPART 100 UNIT/ML ~~LOC~~ SOLN: 0.0000 [IU] | Freq: Three times a day (TID) | SUBCUTANEOUS | Status: DC

## 2018-07-24 MED ORDER — VITAMIN C 500 MG PO TABS
500.0000 mg | ORAL_TABLET | Freq: Every day | ORAL | Status: DC
  Administered 2018-07-25 – 2018-07-26 (×2): 500 mg via ORAL
  Filled 2018-07-24 (×2): qty 1

## 2018-07-24 MED ORDER — GADOBUTROL 1 MMOL/ML IV SOLN
9.0000 mL | Freq: Once | INTRAVENOUS | Status: AC | PRN
  Administered 2018-07-24: 9 mL via INTRAVENOUS

## 2018-07-24 MED ORDER — IRBESARTAN 75 MG PO TABS
75.0000 mg | ORAL_TABLET | Freq: Every day | ORAL | Status: DC
  Administered 2018-07-24 – 2018-07-26 (×3): 75 mg via ORAL
  Filled 2018-07-24 (×3): qty 1

## 2018-07-24 MED ORDER — ONDANSETRON HCL 4 MG/2ML IJ SOLN: 4.0000 mg | Freq: Four times a day (QID) | INTRAMUSCULAR | Status: DC | PRN

## 2018-07-24 MED ORDER — LORATADINE 10 MG PO TABS
10.0000 mg | ORAL_TABLET | Freq: Every day | ORAL | Status: DC
  Administered 2018-07-25 – 2018-07-26 (×2): 10 mg via ORAL
  Filled 2018-07-24 (×2): qty 1

## 2018-07-24 MED ORDER — MONTELUKAST SODIUM 10 MG PO TABS
10.0000 mg | ORAL_TABLET | Freq: Every day | ORAL | Status: DC
  Administered 2018-07-24 – 2018-07-25 (×2): 10 mg via ORAL
  Filled 2018-07-24 (×3): qty 1

## 2018-07-24 MED ORDER — TETANUS-DIPHTH-ACELL PERTUSSIS 5-2.5-18.5 LF-MCG/0.5 IM SUSP
0.5000 mL | Freq: Once | INTRAMUSCULAR | Status: AC
  Administered 2018-07-24: 0.5 mL via INTRAMUSCULAR
  Filled 2018-07-24: qty 0.5

## 2018-07-24 MED ORDER — SODIUM CHLORIDE 0.9% FLUSH
3.0000 mL | Freq: Two times a day (BID) | INTRAVENOUS | Status: DC
  Administered 2018-07-24 – 2018-07-25 (×2): 3 mL via INTRAVENOUS

## 2018-07-24 MED ORDER — BUPIVACAINE HCL (PF) 0.5 % IJ SOLN
30.0000 mL | Freq: Once | INTRAMUSCULAR | Status: AC
  Administered 2018-07-24: 30 mL
  Filled 2018-07-24: qty 30

## 2018-07-24 MED ORDER — ATORVASTATIN CALCIUM 20 MG PO TABS
40.0000 mg | ORAL_TABLET | Freq: Every evening | ORAL | Status: DC
  Administered 2018-07-24 – 2018-07-25 (×2): 40 mg via ORAL
  Filled 2018-07-24 (×2): qty 2

## 2018-07-24 NOTE — ED Notes (Signed)
Patient speaking with MRI.

## 2018-07-24 NOTE — ED Notes (Signed)
Patient transported to CT 

## 2018-07-24 NOTE — ED Notes (Signed)
ED Provider Robinson at bedside. 

## 2018-07-24 NOTE — ED Notes (Signed)
Patient to be transported MRI then to room.

## 2018-07-24 NOTE — ED Provider Notes (Signed)
[facility] Emergency Department Provider Note    First MD Initiated Contact with Patient 07/24/18 1319     (approximate)  I have reviewed the triage vital signs and the nursing notes.   HISTORY  Chief Complaint Fall    HPI Beverly [name] is a 84 y.o. female below listed past medical history presents the ER after syncopal episode.  Patient was found down in the parking lot of [facility] by her granddaughter.  She was taken her granddaughter to the clinic.  Was otherwise feeling well this morning.  She does not remember the situation surrounding her fainting spell.  Did not feel like she had been out in the parking lot for too long.  Denied any chest pain.  Does not recall any symptoms prior to.  Denies any discomfort right now.  Did break her upper dentures and has a laceration of the lower lip.  Denies any other pain but also has some swelling contusion of the right forehead.  Denies any diarrhea or melena.    Past Medical History:  Diagnosis Date  . Allergy   . Arthritis   . Diabetes mellitus without complication ([place])   . Hyperlipidemia   . Hypertension    Family History  Problem Relation Age of Onset  . Hypertension Mother   . Healthy Father   . Healthy Daughter   . Raynaud syndrome Daughter   . Hypertension Daughter   . Stroke Sister    Past Surgical History:  Procedure Laterality Date  . ABDOMINAL HYSTERECTOMY     Patient Active Problem List   Diagnosis Date Noted  . 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 Type 2 diabetes mellitus with pressure callus ([place]) 10/24/2017  . Diabetic retinopathy ([place]) 11/12/2015  . Obesity (BMI 30.0-34.9) 06/02/2015  . Osteoarthritis 06/02/2015  . Osteopenia 06/02/2015  . Narrowing of intervertebral disc space 06/02/2015  . Dyslipidemia associated with type 2 diabetes mellitus ([place]) 06/02/2015  .  Perennial allergic rhinitis 02/28/2007  . Benign essential HTN 06/26/2006  . Pure hypercholesterolemia 06/26/2006      Prior to Admission medications   Medication Sig Start Date End Date Taking? Authorizing Provider  alendronate (FOSAMAX) 35 MG tablet TAKE 1 TABLET BY MOUTH WEEKLY WITH A FULL GLASS OF WATER ON AN EMPTY STOMACH. 05/02/18   Steele Sizer, MD  aspirin 81 MG tablet Take by mouth.    [provider]  atorvastatin (LIPITOR) 40 MG tablet Take 1 tablet (40 mg total) by mouth daily. 07/02/18   Steele Sizer, MD  Cholecalciferol (VITAMIN D) 2000 units CAPS Take 1 capsule (2,000 Units total) by mouth daily. 10/14/16   Steele Sizer, MD  ferrous sulfate 325 (65 FE) MG tablet Take 325 mg by mouth 2 (two) times daily with a meal.     [provider]  furosemide (LASIX) 40 MG tablet Take 1 tablet (40 mg total) by mouth daily as needed. 10/06/15   Steele Sizer, MD  Glucosamine Sulfate 500 MG TABS Take 1 tablet by mouth once a week.     [provider]  loratadine (CLARITIN) 10 MG tablet Take 1 tablet (10 mg total) by mouth daily. 10/06/15   Steele Sizer, MD  metFORMIN (GLUCOPHAGE) 1000 MG tablet Take 1 tablet (1,000 mg total) by mouth 2 (two) times daily with a meal. 07/02/18   Sowles, Drue Stager, MD  montelukast (SINGULAIR) 10 MG tablet Take 1  tablet (10 mg total) by mouth at bedtime. 07/02/18   Steele Sizer, MD  Multiple Vitamin (MULTI-VITAMINS) TABS Take by mouth.    [provider]  Potassium Chloride ER 20 MEQ TBCR Take 10 mEq by mouth daily as needed. With lasix 10/06/15   Steele Sizer, MD  repaglinide (PRANDIN) 2 MG tablet Take 1 tablet (2 mg total) by mouth 3 (three) times daily. 07/02/18   Steele Sizer, MD  telmisartan (MICARDIS) 40 MG tablet Take 1 tablet (40 mg total) by mouth daily. 07/22/18   Steele Sizer, MD  vitamin C (ASCORBIC ACID) 500 MG tablet Take 500 mg by mouth daily.    [provider]  Zinc Acetate 50 MG CAPS  Take by mouth daily.     [provider]    Allergies Lisinopril    Social History Social History   Tobacco Use  . Smoking status: Never Smoker  . Smokeless tobacco: Never Used  . Tobacco comment: smoking cessation materials not required  Substance Use Topics  . Alcohol use: No    Alcohol/week: 0.0 standard drinks  . Drug use: No    Review of Systems Patient denies headaches, rhinorrhea, blurry vision, numbness, shortness of breath, chest pain, edema, cough, abdominal pain, nausea, vomiting, diarrhea, dysuria, fevers, rashes or hallucinations unless otherwise stated above in HPI. ____________________________________________   PHYSICAL EXAM:  VITAL SIGNS: Vitals:   07/24/18 1730 07/24/18 1800  BP: (!) 160/67 (!) 179/77  Pulse: 88   Resp: 11 16  Temp:    SpO2: 100%     Constitutional: Alert and oriented.  Eyes: Conjunctivae are normal.  Head:abrasion to right forehead, 2cm laceration involving vermilion border of right lower lip Nose: No congestion/rhinnorhea. Mouth/Throat: Mucous membranes are moist.  Well approximated 23mm laceration to lower buccal mucosa,, edentulous Neck: No stridor. Painless ROM.  Cardiovascular: Normal rate, regular rhythm. Grossly normal heart sounds.  Good peripheral circulation. Respiratory: Normal respiratory effort.  No retractions. Lungs CTAB. Gastrointestinal: Soft and nontender. No distention. No abdominal bruits. No CVA tenderness. Genitourinary:  Musculoskeletal: No lower extremity tenderness nor edema.  No joint effusions. Neurologic:  Normal speech and language. No gross focal neurologic deficits are appreciated. No facial droop Skin:  Skin is warm, dry and intact. No rash noted. Psychiatric: Mood and affect are normal. Speech and behavior are normal.  ____________________________________________   LABS (all labs ordered are listed, but only abnormal results are displayed)  Results for orders placed or performed  during the hospital encounter of 07/24/18 (from the past 24 hour(s))  CBC with Differential/Platelet     Status: Abnormal   Collection Time: 07/24/18  1:26 PM  Result Value Ref Range   WBC 9.7 4.0 - 10.5 K/uL   RBC 3.59 (L) 3.87 - 5.11 MIL/uL   Hemoglobin 11.2 (L) 12.0 - 15.0 g/dL   HCT 35.2 (L) 36.0 - 46.0 %   MCV 98.1 80.0 - 100.0 fL   MCH 31.2 26.0 - 34.0 pg   MCHC 31.8 30.0 - 36.0 g/dL   RDW 15.1 11.5 - 15.5 %   Platelets 204 150 - 400 K/uL   nRBC 0.0 0.0 - 0.2 %   Neutrophils Relative % 39 %   Neutro Abs 3.7 1.7 - 7.7 K/uL   Lymphocytes Relative 55 %   Lymphs Abs 5.3 (H) 0.7 - 4.0 K/uL   Monocytes Relative 5 %   Monocytes Absolute 0.5 0.1 - 1.0 K/uL   Eosinophils Relative 1 %   Eosinophils Absolute 0.1 0.0 -  0.5 K/uL   Basophils Relative 0 %   Basophils Absolute 0.0 0.0 - 0.1 K/uL   WBC Morphology MORPHOLOGY UNREMARKABLE    RBC Morphology MORPHOLOGY UNREMARKABLE    Smear Review Normal platelet morphology    Immature Granulocytes 0 %   Abs Immature Granulocytes 0.02 0.00 - 0.07 K/uL  Comprehensive metabolic panel     Status: Abnormal   Collection Time: 07/24/18  1:26 PM  Result Value Ref Range   Sodium 141 135 - 145 mmol/L   Potassium 4.2 3.5 - 5.1 mmol/L   Chloride 106 98 - 111 mmol/L   CO2 21 (L) 22 - 32 mmol/L   Glucose, Bld 142 (H) 70 - 99 mg/dL   BUN 17 8 - 23 mg/dL   Creatinine, Ser 0.84 0.44 - 1.00 mg/dL   Calcium 9.1 8.9 - 10.3 mg/dL   Total Protein 6.7 6.5 - 8.1 g/dL   Albumin 3.6 3.5 - 5.0 g/dL   AST 25 15 - 41 U/L   ALT 12 0 - 44 U/L   Alkaline Phosphatase 47 38 - 126 U/L   Total Bilirubin 0.7 0.3 - 1.2 mg/dL   GFR calc non Af Amer >60 >60 mL/min   GFR calc Af Amer >60 >60 mL/min   Anion gap 14 5 - 15  Troponin I (High Sensitivity)     Status: Abnormal   Collection Time: 07/24/18  1:26 PM  Result Value Ref Range   Troponin I (High Sensitivity) 25 (H) <18 ng/L  Urinalysis, Complete w Microscopic     Status: Abnormal   Collection Time: 07/24/18  3:09  PM  Result Value Ref Range   Color, Urine YELLOW (A) YELLOW   APPearance CLEAR (A) CLEAR   Specific Gravity, Urine 1.018 1.005 - 1.030   pH 5.0 5.0 - 8.0   Glucose, UA NEGATIVE NEGATIVE mg/dL   Hgb urine dipstick NEGATIVE NEGATIVE   Bilirubin Urine NEGATIVE NEGATIVE   Ketones, ur NEGATIVE NEGATIVE mg/dL   Protein, ur 30 (A) NEGATIVE mg/dL   Nitrite NEGATIVE NEGATIVE   Leukocytes,Ua MODERATE (A) NEGATIVE   RBC / HPF 0-5 0 - 5 RBC/hpf   WBC, UA 11-20 0 - 5 WBC/hpf   Bacteria, UA NONE SEEN NONE SEEN   Squamous Epithelial / LPF 0-5 0 - 5   Mucus PRESENT    Hyaline Casts, UA PRESENT   SARS Coronavirus 2 (CEPHEID- Performed in [place] hospital lab), Hosp Order     Status: None   Collection Time: 07/24/18  3:09 PM   Specimen: Nasopharyngeal  Result Value Ref Range   SARS Coronavirus 2 NEGATIVE NEGATIVE  Troponin I (High Sensitivity)     Status: Abnormal   Collection Time: 07/24/18  3:09 PM  Result Value Ref Range   Troponin I (High Sensitivity) 32 (H) <18 ng/L   ____________________________________________  EKG My review and personal interpretation at Time: 13:26   Indication: syncope  Rate: 95  Rhythm: sinus Axis: normal Other: normal intervals, no stemi ____________________________________________  RADIOLOGY  I personally reviewed all radiographic images ordered to evaluate for the above acute complaints and reviewed radiology reports and findings.  These findings were personally discussed with the patient.  Please see medical record for radiology report.  ____________________________________________   PROCEDURES  Procedure(s) performed:  Marland KitchenMarland KitchenLaceration Repair  Date/Time: 07/24/2018 4:09 PM Performed by: Merlyn Lot, MD Authorized by: Merlyn Lot, MD   Consent:    Consent obtained:  Verbal   Consent given by:  Patient  Risks discussed:  Infection, pain, retained foreign body, poor cosmetic result and poor wound healing Anesthesia (see MAR for exact  dosages):    Anesthesia method:  Local infiltration   Local anesthetic:  Bupivacaine 0.5% w/o epi Laceration details:    Location:  Lip   Lip location:  Lower interior lip and lower exterior lip   Length (cm):  2   Depth (mm):  5 Repair type:    Repair type:  Intermediate Exploration:    Hemostasis achieved with:  Direct pressure   Wound exploration: entire depth of wound probed and visualized     Contaminated: no   Treatment:    Area cleansed with:  Saline and Betadine   Amount of cleaning:  Extensive   Irrigation solution:  Sterile saline   Visualized foreign bodies/material removed: no   Mucous membrane repair:    Suture size:  6-0   Suture material:  Vicryl   Number of sutures:  6 Skin repair:    Repair method:  Sutures   Suture size:  5-0   Suture material:  Prolene   Number of sutures:  2 Approximation:    Approximation:  Close   Vermilion border: well-aligned   Post-procedure details:    Dressing:  Open (no dressing)   Patient tolerance of procedure:  Tolerated well, no immediate complications      Critical Care performed: no ____________________________________________   INITIAL IMPRESSION / ASSESSMENT AND PLAN / ED COURSE  Pertinent labs & imaging results that were available during my care of the patient were reviewed by me and considered in my medical decision making (see chart for details).   DDX: sdh, sah, epd, concussion, dehydration, dysrhythmia, electrolyte abn  Beverly [name] is a 84 y.o. who presents to the ED with syncopal episode as described above.  Patient with contusion of face and injuries as described above.  Plan lack repair.  CT imaging ordered to evaluate for traumatic injury does show evidence of small subdural hematoma.  Patient not currently on any anticoagulation but does take baby aspirin.  Will consult with neurosurgery for further recommendations.  Clinical Course as of Jul 23 1953  Tue Jul 24, 2018  1525 Case was discussed in  consultation with Dr. Lacinda Axon of neurosurgery.  Given acute subdural on aspirin will repeat CT imaging in 6 hours to ensure that there is no worsening if that is normal will keep here for syncopal work-up.   [PR]  1750 Troponin delta essentially equivocal.  We will continue to monitor.  She remains neuro intact.  Awaiting repeat CT head.  If that is stable anticipate admission here for syncope.  If worsening will discuss with neurosurgery for further recommendations.   [PR]  1912 CT head without any evidence of interval change.  Will discuss with hospitalist for admission for syncope.   [PR]    Clinical Course User Index [PR] Merlyn Lot, MD    The patient was evaluated in Emergency Department today for the symptoms described in the history of present illness. He/she was evaluated in the context of the global COVID-19 pandemic, which necessitated consideration that the patient might be at risk for infection with the SARS-CoV-2 virus that causes COVID-19. Institutional protocols and algorithms that pertain to the evaluation of patients at risk for COVID-19 are in a state of rapid change based on information released by regulatory bodies including the CDC and federal and state organizations. These policies and algorithms were followed during the patient's care in the ED.  As part of my medical decision making, I reviewed the following data within the [place] notes reviewed and incorporated, Labs reviewed, notes from prior ED visits and [place] Controlled Substance Database   ____________________________________________   FINAL CLINICAL IMPRESSION(S) / ED DIAGNOSES  Final diagnoses:  Injury of head, initial encounter  Syncope and collapse  Lip laceration, initial encounter      NEW MEDICATIONS STARTED DURING THIS VISIT:  New Prescriptions   No medications on file     Note:  This document was prepared using Dragon voice recognition software and may include  unintentional dictation errors.    Merlyn Lot, MD 07/24/18 Karl Bales

## 2018-07-24 NOTE — ED Notes (Signed)
This Rn provided pt with bedpan to provide UA.

## 2018-07-24 NOTE — ED Notes (Signed)
This RN spoke with Beverly Gust POC to provide update.

## 2018-07-24 NOTE — ED Triage Notes (Addendum)
Pt from [facility] pediatrics office via [facility]. Per EMS, pt fell; pt was found parking lot by granddaughter. Pt st she does not recall events; pt st "my legs just gave out". Pt presents to ED with laceration to right bottom lip, pt st "my dentures cracked and cut my lip"  bruising noted on left eye. A/Ox4.

## 2018-07-24 NOTE — ED Notes (Signed)
ED TO INPATIENT HANDOFF REPORT  ED Nurse Name and Phone #: Joelene Millin [phone]  S Name/Age/Gender Beverly [name] 84 y.o. female Room/Bed: ED17A/ED17A  Code Status   Code Status: Full Code  Home/SNF/Other Home Patient oriented to: self, place, time and situation Is this baseline? Yes   Triage Complete: Triage complete  Chief Complaint fall  Triage Note Pt from [facility] pediatrics office via [place]. Per EMS, pt fell; pt was found parking lot by granddaughter. Pt st she does not recall events; pt st "my legs just gave out". Pt presents to ED with laceration to right bottom lip, pt st "my dentures cracked and cut my lip"  bruising noted on left eye. A/Ox4.     Allergies Allergies  Allergen Reactions  . Lisinopril     Level of Care/Admitting Diagnosis ED Disposition    ED Disposition Condition [facility] Area: [place] [[id number]]  Level of Care: Med-Surg [16]  Covid Evaluation: Confirmed COVID Negative  Diagnosis: Syncope and collapse [780.2.ICD-9-CM]  Admitting Physician: Mayer Camel [[id number]]  Attending Physician: Mayer Camel [[id number]]  Estimated length of stay: past midnight tomorrow  Certification:: I certify this patient will need inpatient services for at least 2 midnights  PT Class (Do Not Modify): Inpatient [101]  PT Acc Code (Do Not Modify): Private [1]       B Medical/Surgery History Past Medical History:  Diagnosis Date  . Allergy   . Arthritis   . Diabetes mellitus without complication ([place])   . Hyperlipidemia   . Hypertension    Past Surgical History:  Procedure Laterality Date  . ABDOMINAL HYSTERECTOMY       A IV Location/Drains/Wounds Patient Lines/Drains/Airways Status   Active Line/Drains/Airways    Name:   Placement date:   Placement time:   Site:   Days:   Peripheral IV 07/24/18 Right Wrist   07/24/18    1328    Wrist   less than 1          Intake/Output Last 24 hours No intake or  output data in the 24 hours ending 07/24/18 2120  Labs/Imaging Results for orders placed or performed during the hospital encounter of 07/24/18 (from the past 48 hour(s))  CBC with Differential/Platelet     Status: Abnormal   Collection Time: 07/24/18  1:26 PM  Result Value Ref Range   WBC 9.7 4.0 - 10.5 K/uL   RBC 3.59 (L) 3.87 - 5.11 MIL/uL   Hemoglobin 11.2 (L) 12.0 - 15.0 g/dL   HCT 35.2 (L) 36.0 - 46.0 %   MCV 98.1 80.0 - 100.0 fL   MCH 31.2 26.0 - 34.0 pg   MCHC 31.8 30.0 - 36.0 g/dL   RDW 15.1 11.5 - 15.5 %   Platelets 204 150 - 400 K/uL   nRBC 0.0 0.0 - 0.2 %   Neutrophils Relative % 39 %   Neutro Abs 3.7 1.7 - 7.7 K/uL   Lymphocytes Relative 55 %   Lymphs Abs 5.3 (H) 0.7 - 4.0 K/uL   Monocytes Relative 5 %   Monocytes Absolute 0.5 0.1 - 1.0 K/uL   Eosinophils Relative 1 %   Eosinophils Absolute 0.1 0.0 - 0.5 K/uL   Basophils Relative 0 %   Basophils Absolute 0.0 0.0 - 0.1 K/uL   WBC Morphology MORPHOLOGY UNREMARKABLE    RBC Morphology MORPHOLOGY UNREMARKABLE    Smear Review Normal platelet morphology    Immature Granulocytes 0 %   Abs  Immature Granulocytes 0.02 0.00 - 0.07 K/uL    Comment: Performed at [facility], [address] [place], [place] [address]  Comprehensive metabolic panel     Status: Abnormal   Collection Time: 07/24/18  1:26 PM  Result Value Ref Range   Sodium 141 135 - 145 mmol/L   Potassium 4.2 3.5 - 5.1 mmol/L   Chloride 106 98 - 111 mmol/L   CO2 21 (L) 22 - 32 mmol/L   Glucose, Bld 142 (H) 70 - 99 mg/dL   BUN 17 8 - 23 mg/dL   Creatinine, Ser 0.84 0.44 - 1.00 mg/dL   Calcium 9.1 8.9 - 10.3 mg/dL   Total Protein 6.7 6.5 - 8.1 g/dL   Albumin 3.6 3.5 - 5.0 g/dL   AST 25 15 - 41 U/L   ALT 12 0 - 44 U/L   Alkaline Phosphatase 47 38 - 126 U/L   Total Bilirubin 0.7 0.3 - 1.2 mg/dL   GFR calc non Af Amer >60 >60 mL/min   GFR calc Af Amer >60 >60 mL/min   Anion gap 14 5 - 15    Comment: Performed at [facility], [place], [place] [address]  Troponin I (High Sensitivity)     Status: Abnormal   Collection Time: 07/24/18  1:26 PM  Result Value Ref Range   Troponin I (High Sensitivity) 25 (H) <18 ng/L    Comment: (NOTE) Elevated high sensitivity troponin I (hsTnI) values and significant  changes across serial measurements may suggest ACS but many other  chronic and acute conditions are known to elevate hsTnI results.  Refer to the "Links" section for chest pain algorithms and additional  guidance. Performed at [facility], [address] [place], [place] [address]   Urinalysis, Complete w Microscopic     Status: Abnormal   Collection Time: 07/24/18  3:09 PM  Result Value Ref Range   Color, Urine YELLOW (A) YELLOW   APPearance CLEAR (A) CLEAR   Specific Gravity, Urine 1.018 1.005 - 1.030   pH 5.0 5.0 - 8.0   Glucose, UA NEGATIVE NEGATIVE mg/dL   Hgb urine dipstick NEGATIVE NEGATIVE   Bilirubin Urine NEGATIVE NEGATIVE   Ketones, ur NEGATIVE NEGATIVE mg/dL   Protein, ur 30 (A) NEGATIVE mg/dL   Nitrite NEGATIVE NEGATIVE   Leukocytes,Ua MODERATE (A) NEGATIVE   RBC / HPF 0-5 0 - 5 RBC/hpf   WBC, UA 11-20 0 - 5 WBC/hpf   Bacteria, UA NONE SEEN NONE SEEN   Squamous Epithelial / LPF 0-5 0 - 5   Mucus PRESENT    Hyaline Casts, UA PRESENT     Comment: Performed at [facility], [address] [place], [place] [address]  SARS Coronavirus 2 (CEPHEID- Performed in [facility] hospital lab), Hosp Order     Status: None   Collection Time: 07/24/18  3:09 PM   Specimen: Nasopharyngeal  Result Value Ref Range   SARS Coronavirus 2 NEGATIVE NEGATIVE    Comment: (NOTE) If result is NEGATIVE SARS-CoV-2 target nucleic acids are NOT DETECTED. The SARS-CoV-2 RNA is generally detectable in upper and lower  respiratory specimens during the acute phase of infection. The lowest  concentration of SARS-CoV-2 viral copies this assay can detect is 250  copies / mL. A negative result  does not preclude SARS-CoV-2 infection  and should not be used as the sole basis for treatment or other  patient management decisions.  A negative result may occur with  improper specimen collection / handling, submission of specimen other  than nasopharyngeal swab, presence of viral mutation(s) within the  areas targeted by this assay, and inadequate number of viral copies  (<250 copies / mL). A negative result must be combined with clinical  observations, patient history, and epidemiological information. If result is POSITIVE SARS-CoV-2 target nucleic acids are DETECTED. The SARS-CoV-2 RNA is generally detectable in upper and lower  respiratory specimens dur ing the acute phase of infection.  Positive  results are indicative of active infection with SARS-CoV-2.  Clinical  correlation with patient history and other diagnostic information is  necessary to determine patient infection status.  Positive results do  not rule out bacterial infection or co-infection with other viruses. If result is PRESUMPTIVE POSTIVE SARS-CoV-2 nucleic acids MAY BE PRESENT.   A presumptive positive result was obtained on the submitted specimen  and confirmed on repeat testing.  While 2019 novel coronavirus  (SARS-CoV-2) nucleic acids may be present in the submitted sample  additional confirmatory testing may be necessary for epidemiological  and / or clinical management purposes  to differentiate between  SARS-CoV-2 and other Sarbecovirus currently known to infect humans.  If clinically indicated additional testing with an alternate test  methodology [phone]) is advised. The SARS-CoV-2 RNA is generally  detectable in upper and lower respiratory sp ecimens during the acute  phase of infection. The expected result is Negative. Fact Sheet for Patients:  StrictlyIdeas.no Fact Sheet for Healthcare Providers: BankingDealers.co.za This test is not yet approved or  cleared by the [place] FDA and has been authorized for detection and/or diagnosis of SARS-CoV-2 by FDA under an Emergency Use Authorization (EUA).  This EUA will remain in effect (meaning this test can be used) for the duration of the COVID-19 declaration under Section 564(b)(1) of the Act, 21 U.S.C. section 360bbb-3(b)(1), unless the authorization is terminated or revoked sooner. Performed at [facility], [place], [place] [address]   Troponin I (High Sensitivity)     Status: Abnormal   Collection Time: 07/24/18  3:09 PM  Result Value Ref Range   Troponin I (High Sensitivity) 32 (H) <18 ng/L    Comment: (NOTE) Elevated high sensitivity troponin I (hsTnI) values and significant  changes across serial measurements may suggest ACS but many other  chronic and acute conditions are known to elevate hsTnI results.  Refer to the "Links" section for chest pain algorithms and additional  guidance. Performed at [facility], [address] [place], [place] [address]    Ct Head Wo Contrast  Result Date: 07/24/2018 CLINICAL DATA:  Known subdural hematoma, follow-up, fell in parking lot EXAM: CT HEAD WITHOUT CONTRAST TECHNIQUE: Contiguous axial images were obtained from the base of the skull through the vertex without intravenous contrast. Sagittal and coronal MPR images reconstructed from axial data set. COMPARISON:  Study at 1847 hours compared to 1337 hours FINDINGS: Brain: Mild atrophy. Normal ventricular morphology. No midline shift or mass effect. Small vessel chronic ischemic changes of deep cerebral white matter. Again identified parafalcine subdural hematoma up to 6 mm thick, previously 7 mm. No new areas of hemorrhage or infarction. No mass lesion. Vascular: Atherosclerotic calcification of internal carotid arteries bilaterally at skull base Skull: Intact Sinuses/Orbits: Clear Other: N/A IMPRESSION: Stable appearance of a small parafalcine subdural  hematoma measuring 6 mm thick. No midline shift. Underlying atrophy and small vessel chronic ischemic changes of deep cerebral white matter. No new intracranial abnormalities. Electronically Signed   By:  Lavonia Dana M.D.   On: 07/24/2018 19:10   Ct Head Wo Contrast  Result Date: 07/24/2018 CLINICAL DATA:  84 year old who fell earlier today and has RIGHT facial swelling and neck pain. Laceration to the lip. Patient amnestic to the event. Initial encounter. EXAM: CT HEAD WITHOUT CONTRAST CT MAXILLOFACIAL WITHOUT CONTRAST CT CERVICAL SPINE WITHOUT CONTRAST TECHNIQUE: Multidetector CT imaging of the head, cervical spine, and maxillofacial structures were performed using the standard protocol without intravenous contrast. Multiplanar CT image reconstructions of the cervical spine and maxillofacial structures were also generated. A metallic BB was placed on the right temple in order to reliably differentiate right from left. COMPARISON:  None. FINDINGS: CT HEAD FINDINGS Brain: RIGHT parafalcine subdural hematoma extending upward to near the vertex, maximum thickness approximately 7 mm, without associated mass effect or midline shift. No evidence of parenchymal hemorrhage or hematoma. No evidence of subarachnoid hemorrhage or intraventricular hemorrhage. Ventricular system normal in size and appearance for age. Mild age-appropriate cortical atrophy. Moderate to severe changes of small vessel disease of the white matter diffusely. Vascular: Severe BILATERAL carotid siphon and vertebrobasilar atherosclerosis. No hyperdense vessel. Skull: No skull fracture or other focal osseous abnormality involving the skull. Other: None. CT MAXILLOFACIAL FINDINGS Osseous: No fractures identified involving the facial bones. Temporomandibular joints anatomically aligned without significant degenerative change. Osseous demineralization. Edentulous. Orbits: No orbital fractures. No evidence of orbital hemorrhage. Extraocular muscles normal  in appearance throughout. Hyperdensity in both lens, query cataracts. Senile calcification involving the RIGHT globe. Sinuses: Opacification of a solitary LEFT POSTERIOR ethmoid air cell. Paranasal sinuses otherwise well aerated throughout. BILATERAL middle ear cavities and BILATERAL mastoid air cells well-aerated. Bony nasal septal deviation to the RIGHT. Soft tissues: Hematoma in the subcutaneous tissues of the RIGHT cheek measuring approximately 2.7 x 1.7 cm. Small hematoma involving the RIGHT side of the cheek in measuring approximately 0.8 x 1.3 cm. CT CERVICAL SPINE FINDINGS Alignment: Anatomic POSTERIOR alignment. Straightening of the usual lordosis. Facet joints anatomically aligned throughout with diffuse degenerative changes. Skull base and vertebrae: No fractures identified involving the cervical spine. Coronal reformatted images demonstrate an intact craniocervical junction, intact dens and intact lateral masses throughout. Benign bone island in the LEFT lateral arch of C1. Soft tissues and spinal canal: No evidence of paraspinous or spinal canal hematoma. No evidence of spinal stenosis. Disc levels: Moderate disc space narrowing at C5-6 and mild disc space narrowing at C4-5. No visible disc protrusions or extrusions on the soft tissue window images. Combination of facet and uncinate hypertrophy account for multilevel foraminal stenoses including mild RIGHT C2-3, mild BILATERAL C3-4, severe BILATERAL C4-5, moderate BILATERAL C5-6. Upper chest: Visualized lung apices clear. Mild atherosclerosis involving the visualized great vessels. Other: Mild BILATERAL cervical carotid atherosclerosis. IMPRESSION: 1. Small RIGHT parafalcine subdural hematoma, maximum thickness 7 mm, without associated mass effect or midline shift. No evidence of hemorrhage or hematoma elsewhere. 2. No acute intracranial abnormalities otherwise. 3. Mild age-appropriate cortical atrophy and moderate to severe chronic microvascular  ischemic changes of the white matter. 4. No facial bone fractures identified. 5. Hematoma in the subcutaneous tissues of the RIGHT cheek and in the RIGHT side of the cheek. 6. No cervical spine fractures identified. 7. Multilevel degenerative disc disease, spondylosis and facet degenerative changes with multilevel foraminal stenoses as detailed above. I telephoned these results at the time of interpretation on 07/24/2018 at 2:18 pm to Dr. Merlyn Lot, who verbally acknowledged these results. Electronically Signed   By: Sherran Needs.D.  On: 07/24/2018 14:20   Ct Cervical Spine Wo Contrast  Result Date: 07/24/2018 CLINICAL DATA:  84 year old who fell earlier today and has RIGHT facial swelling and neck pain. Laceration to the lip. Patient amnestic to the event. Initial encounter. EXAM: CT HEAD WITHOUT CONTRAST CT MAXILLOFACIAL WITHOUT CONTRAST CT CERVICAL SPINE WITHOUT CONTRAST TECHNIQUE: Multidetector CT imaging of the head, cervical spine, and maxillofacial structures were performed using the standard protocol without intravenous contrast. Multiplanar CT image reconstructions of the cervical spine and maxillofacial structures were also generated. A metallic BB was placed on the right temple in order to reliably differentiate right from left. COMPARISON:  None. FINDINGS: CT HEAD FINDINGS Brain: RIGHT parafalcine subdural hematoma extending upward to near the vertex, maximum thickness approximately 7 mm, without associated mass effect or midline shift. No evidence of parenchymal hemorrhage or hematoma. No evidence of subarachnoid hemorrhage or intraventricular hemorrhage. Ventricular system normal in size and appearance for age. Mild age-appropriate cortical atrophy. Moderate to severe changes of small vessel disease of the white matter diffusely. Vascular: Severe BILATERAL carotid siphon and vertebrobasilar atherosclerosis. No hyperdense vessel. Skull: No skull fracture or other focal osseous  abnormality involving the skull. Other: None. CT MAXILLOFACIAL FINDINGS Osseous: No fractures identified involving the facial bones. Temporomandibular joints anatomically aligned without significant degenerative change. Osseous demineralization. Edentulous. Orbits: No orbital fractures. No evidence of orbital hemorrhage. Extraocular muscles normal in appearance throughout. Hyperdensity in both lens, query cataracts. Senile calcification involving the RIGHT globe. Sinuses: Opacification of a solitary LEFT POSTERIOR ethmoid air cell. Paranasal sinuses otherwise well aerated throughout. BILATERAL middle ear cavities and BILATERAL mastoid air cells well-aerated. Bony nasal septal deviation to the RIGHT. Soft tissues: Hematoma in the subcutaneous tissues of the RIGHT cheek measuring approximately 2.7 x 1.7 cm. Small hematoma involving the RIGHT side of the cheek in measuring approximately 0.8 x 1.3 cm. CT CERVICAL SPINE FINDINGS Alignment: Anatomic POSTERIOR alignment. Straightening of the usual lordosis. Facet joints anatomically aligned throughout with diffuse degenerative changes. Skull base and vertebrae: No fractures identified involving the cervical spine. Coronal reformatted images demonstrate an intact craniocervical junction, intact dens and intact lateral masses throughout. Benign bone island in the LEFT lateral arch of C1. Soft tissues and spinal canal: No evidence of paraspinous or spinal canal hematoma. No evidence of spinal stenosis. Disc levels: Moderate disc space narrowing at C5-6 and mild disc space narrowing at C4-5. No visible disc protrusions or extrusions on the soft tissue window images. Combination of facet and uncinate hypertrophy account for multilevel foraminal stenoses including mild RIGHT C2-3, mild BILATERAL C3-4, severe BILATERAL C4-5, moderate BILATERAL C5-6. Upper chest: Visualized lung apices clear. Mild atherosclerosis involving the visualized great vessels. Other: Mild BILATERAL  cervical carotid atherosclerosis. IMPRESSION: 1. Small RIGHT parafalcine subdural hematoma, maximum thickness 7 mm, without associated mass effect or midline shift. No evidence of hemorrhage or hematoma elsewhere. 2. No acute intracranial abnormalities otherwise. 3. Mild age-appropriate cortical atrophy and moderate to severe chronic microvascular ischemic changes of the white matter. 4. No facial bone fractures identified. 5. Hematoma in the subcutaneous tissues of the RIGHT cheek and in the RIGHT side of the cheek. 6. No cervical spine fractures identified. 7. Multilevel degenerative disc disease, spondylosis and facet degenerative changes with multilevel foraminal stenoses as detailed above. I telephoned these results at the time of interpretation on 07/24/2018 at 2:18 pm to Dr. Merlyn Lot, who verbally acknowledged these results. Electronically Signed   By: Evangeline Dakin M.D.   On: 07/24/2018 14:20  Dg Chest Portable 1 View  Result Date: 07/24/2018 CLINICAL DATA:  Syncope. EXAM: PORTABLE CHEST 1 VIEW COMPARISON:  09/06/2007 FINDINGS: The heart size and mediastinal contours are within normal limits. Both lungs are clear. Chronic degenerative changes of both shoulders. Aortic atherosclerosis. IMPRESSION: 1. No acute cardiopulmonary disease. 2. Aortic atherosclerosis. Electronically Signed   By: Lorriane Shire M.D.   On: 07/24/2018 14:14   Ct Maxillofacial Wo Contrast  Result Date: 07/24/2018 CLINICAL DATA:  84 year old who fell earlier today and has RIGHT facial swelling and neck pain. Laceration to the lip. Patient amnestic to the event. Initial encounter. EXAM: CT HEAD WITHOUT CONTRAST CT MAXILLOFACIAL WITHOUT CONTRAST CT CERVICAL SPINE WITHOUT CONTRAST TECHNIQUE: Multidetector CT imaging of the head, cervical spine, and maxillofacial structures were performed using the standard protocol without intravenous contrast. Multiplanar CT image reconstructions of the cervical spine and maxillofacial  structures were also generated. A metallic BB was placed on the right temple in order to reliably differentiate right from left. COMPARISON:  None. FINDINGS: CT HEAD FINDINGS Brain: RIGHT parafalcine subdural hematoma extending upward to near the vertex, maximum thickness approximately 7 mm, without associated mass effect or midline shift. No evidence of parenchymal hemorrhage or hematoma. No evidence of subarachnoid hemorrhage or intraventricular hemorrhage. Ventricular system normal in size and appearance for age. Mild age-appropriate cortical atrophy. Moderate to severe changes of small vessel disease of the white matter diffusely. Vascular: Severe BILATERAL carotid siphon and vertebrobasilar atherosclerosis. No hyperdense vessel. Skull: No skull fracture or other focal osseous abnormality involving the skull. Other: None. CT MAXILLOFACIAL FINDINGS Osseous: No fractures identified involving the facial bones. Temporomandibular joints anatomically aligned without significant degenerative change. Osseous demineralization. Edentulous. Orbits: No orbital fractures. No evidence of orbital hemorrhage. Extraocular muscles normal in appearance throughout. Hyperdensity in both lens, query cataracts. Senile calcification involving the RIGHT globe. Sinuses: Opacification of a solitary LEFT POSTERIOR ethmoid air cell. Paranasal sinuses otherwise well aerated throughout. BILATERAL middle ear cavities and BILATERAL mastoid air cells well-aerated. Bony nasal septal deviation to the RIGHT. Soft tissues: Hematoma in the subcutaneous tissues of the RIGHT cheek measuring approximately 2.7 x 1.7 cm. Small hematoma involving the RIGHT side of the cheek in measuring approximately 0.8 x 1.3 cm. CT CERVICAL SPINE FINDINGS Alignment: Anatomic POSTERIOR alignment. Straightening of the usual lordosis. Facet joints anatomically aligned throughout with diffuse degenerative changes. Skull base and vertebrae: No fractures identified involving  the cervical spine. Coronal reformatted images demonstrate an intact craniocervical junction, intact dens and intact lateral masses throughout. Benign bone island in the LEFT lateral arch of C1. Soft tissues and spinal canal: No evidence of paraspinous or spinal canal hematoma. No evidence of spinal stenosis. Disc levels: Moderate disc space narrowing at C5-6 and mild disc space narrowing at C4-5. No visible disc protrusions or extrusions on the soft tissue window images. Combination of facet and uncinate hypertrophy account for multilevel foraminal stenoses including mild RIGHT C2-3, mild BILATERAL C3-4, severe BILATERAL C4-5, moderate BILATERAL C5-6. Upper chest: Visualized lung apices clear. Mild atherosclerosis involving the visualized great vessels. Other: Mild BILATERAL cervical carotid atherosclerosis. IMPRESSION: 1. Small RIGHT parafalcine subdural hematoma, maximum thickness 7 mm, without associated mass effect or midline shift. No evidence of hemorrhage or hematoma elsewhere. 2. No acute intracranial abnormalities otherwise. 3. Mild age-appropriate cortical atrophy and moderate to severe chronic microvascular ischemic changes of the white matter. 4. No facial bone fractures identified. 5. Hematoma in the subcutaneous tissues of the RIGHT cheek and in the RIGHT side of the  cheek. 6. No cervical spine fractures identified. 7. Multilevel degenerative disc disease, spondylosis and facet degenerative changes with multilevel foraminal stenoses as detailed above. I telephoned these results at the time of interpretation on 07/24/2018 at 2:18 pm to Dr. Merlyn Lot, who verbally acknowledged these results. Electronically Signed   By: Evangeline Dakin M.D.   On: 07/24/2018 14:20    Pending Labs Unresulted Labs (From admission, onward)    Start     Ordered   07/25/18 0814  Basic metabolic panel  Tomorrow morning,   STAT     07/24/18 2107   07/25/18 0500  CBC  Tomorrow morning,   STAT     07/24/18 2107    07/25/18 0500  Protime-INR  Tomorrow morning,   STAT     07/24/18 2107   07/24/18 2108  TSH  Once,   STAT     07/24/18 2107          Vitals/Pain Today's Vitals   07/24/18 1715 07/24/18 1730 07/24/18 1800 07/24/18 1816  BP:  (!) 160/67 (!) 179/77   Pulse:  88    Resp:  11 16   Temp:      TempSrc:      SpO2:  100%    Weight:      Height:      PainSc: 0-No pain   0-No pain    Isolation Precautions No active isolations  Medications Medications  atorvastatin (LIPITOR) tablet 40 mg (has no administration in time range)  loratadine (CLARITIN) tablet 10 mg (has no administration in time range)  metFORMIN (GLUCOPHAGE) tablet 1,000 mg (has no administration in time range)  montelukast (SINGULAIR) tablet 10 mg (has no administration in time range)  repaglinide (PRANDIN) tablet 2 mg (has no administration in time range)  irbesartan (AVAPRO) tablet 75 mg (has no administration in time range)  ferrous sulfate tablet 325 mg (has no administration in time range)  vitamin C (ASCORBIC ACID) tablet 500 mg (has no administration in time range)  sodium chloride flush (NS) 0.9 % injection 3 mL (has no administration in time range)  0.9 %  sodium chloride infusion (has no administration in time range)  acetaminophen (TYLENOL) tablet 650 mg (has no administration in time range)    Or  acetaminophen (TYLENOL) suppository 650 mg (has no administration in time range)  polyethylene glycol (MIRALAX / GLYCOLAX) packet 17 g (has no administration in time range)  ondansetron (ZOFRAN) tablet 4 mg (has no administration in time range)    Or  ondansetron (ZOFRAN) injection 4 mg (has no administration in time range)  insulin aspart (novoLOG) injection 0-5 Units (has no administration in time range)  insulin aspart (novoLOG) injection 0-15 Units (has no administration in time range)  bupivacaine (MARCAINE) 0.5 % injection 30 mL (30 mLs Infiltration Given by Other 07/24/18 1433)  Tdap (BOOSTRIX) injection  0.5 mL (0.5 mLs Intramuscular Given 07/24/18 1406)  sodium chloride 0.9 % bolus 500 mL (0 mLs Intravenous Stopped 07/24/18 1854)    Mobility walks Moderate fall risk   Focused Assessments na   R Recommendations: See Admitting Provider Note  Report given to:   Additional Notes:

## 2018-07-24 NOTE — ED Notes (Signed)
Pt unable to void at this time. This Rn will continue to monitor pt.

## 2018-07-24 NOTE — H&P (Signed)
[facility] NAME: Draven Laine    MR#:  [id number]  DATE OF BIRTH:  04/01/1934  DATE OF ADMISSION:  07/24/2018  PRIMARY CARE PHYSICIAN: Steele Sizer, MD   REQUESTING/REFERRING PHYSICIAN: Merlyn Lot, MD  CHIEF COMPLAINT:   Chief Complaint  Patient presents with  . Fall    HISTORY OF PRESENT ILLNESS:  Beverly [name]  is a 84 y.o. female with a known history of diabetes mellitus, hypertension, hyperlipidemia, and arthritis.  She presented to the emergency room via EMS services after an upset of syncope and collapse.  She was found down in the parking lot of [facility] by her granddaughter.  Patient reports she has been feeling well with no recent illness.  She denies fevers or chills.  No nausea, vomiting, diarrhea, or abdominal pain.  She denies chest pain or shortness of breath.  She denies dizziness or unilateral weakness.  She denies vision disturbance or diplopia.  She denies prior syncopal episodes.  She denies history of MI or CVA.  She has a small superficial laceration to her right lower lip as well as a small right forehead contusion.  CT brain on arrival demonstrated a small right parafalcine subdural hematoma with maximum thickness 7 mm with no associated mass-effect or midline shift.  No evidence of hemorrhage or hematoma elsewhere.  No other evidence of intracranial abnormalities seen.  No evidence of facial bone fractures.  There is hematoma in the subcutaneous tissues of the right cheek and in the right side of the cheek.  No cervical spine fracture seen.  Dr. Lacinda Axon with neurosurgery was consulted by the ED physician recommending repeated CT head in 6 hours which was completed with stable appearance of small parafalcine subdural hematoma measuring 6 mm thick at that time.  No evidence of midline shift at that time.  No new intracranial abnormality seen at that time.  Initial troponin is 32.  Patient denies  experiencing chest pain or shortness of breath.Dr End was consulted by the ED physician as well with discussion of elevated troponin.  Will get echocardiogram in the a.m. and continue to trend troponin levels.  We have admitted her to the hospitalist service for further management.  PAST MEDICAL HISTORY:   Past Medical History:  Diagnosis Date  . Allergy   . Arthritis   . Diabetes mellitus without complication ([place])   . Hyperlipidemia   . Hypertension     PAST SURGICAL HISTORY:   Past Surgical History:  Procedure Laterality Date  . ABDOMINAL HYSTERECTOMY      SOCIAL HISTORY:   Social History   Tobacco Use  . Smoking status: Never Smoker  . Smokeless tobacco: Never Used  . Tobacco comment: smoking cessation materials not required  Substance Use Topics  . Alcohol use: No    Alcohol/week: 0.0 standard drinks    FAMILY HISTORY:   Family History  Problem Relation Age of Onset  . Hypertension Mother   . Healthy Father   . Healthy Daughter   . Raynaud syndrome Daughter   . Hypertension Daughter   . Stroke Sister     DRUG ALLERGIES:   Allergies  Allergen Reactions  . Lisinopril     REVIEW OF SYSTEMS:   Review of Systems  Constitutional: Negative for chills, fever and malaise/fatigue.  HENT: Negative for congestion, ear pain, hearing loss, nosebleeds, sinus pain, sore throat and tinnitus.   Eyes: Negative for blurred vision, double vision, photophobia and pain.  Respiratory: Negative for cough, sputum production, shortness of breath and wheezing.   Cardiovascular: Negative for chest pain, palpitations, orthopnea and leg swelling.  Gastrointestinal: Negative for abdominal pain, constipation, diarrhea, heartburn, nausea and vomiting.  Genitourinary: Negative for dysuria, flank pain and hematuria.  Musculoskeletal: Positive for falls (Syncope with collapse). Negative for myalgias.  Skin: Negative for itching and rash.  Neurological: Positive for loss of  consciousness. Negative for dizziness, tingling, sensory change, speech change, focal weakness, seizures and headaches.  Psychiatric/Behavioral: Negative.  Negative for depression.   MEDICATIONS AT HOME:   Prior to Admission medications   Medication Sig Start Date End Date Taking? Authorizing Provider  alendronate (FOSAMAX) 35 MG tablet TAKE 1 TABLET BY MOUTH WEEKLY WITH A FULL GLASS OF WATER ON AN EMPTY STOMACH. 05/02/18  Yes Sowles, Drue Stager, MD  aspirin 81 MG tablet Take by mouth.   Yes [provider]  atorvastatin (LIPITOR) 40 MG tablet Take 1 tablet (40 mg total) by mouth daily. 07/02/18  Yes Sowles, Drue Stager, MD  Cholecalciferol (VITAMIN D) 2000 units CAPS Take 1 capsule (2,000 Units total) by mouth daily. 10/14/16  Yes Sowles, Drue Stager, MD  ferrous sulfate 325 (65 FE) MG tablet Take 325 mg by mouth 2 (two) times daily with a meal.    Yes [provider]  furosemide (LASIX) 40 MG tablet Take 1 tablet (40 mg total) by mouth daily as needed. 10/06/15  Yes Sowles, Drue Stager, MD  Glucosamine Sulfate 500 MG TABS Take 1 tablet by mouth once a week.    Yes [provider]  loratadine (CLARITIN) 10 MG tablet Take 1 tablet (10 mg total) by mouth daily. 10/06/15  Yes Sowles, Drue Stager, MD  metFORMIN (GLUCOPHAGE) 1000 MG tablet Take 1 tablet (1,000 mg total) by mouth 2 (two) times daily with a meal. 07/02/18  Yes Sowles, Drue Stager, MD  montelukast (SINGULAIR) 10 MG tablet Take 1 tablet (10 mg total) by mouth at bedtime. 07/02/18  Yes Steele Sizer, MD  Multiple Vitamin (MULTI-VITAMINS) TABS Take by mouth.   Yes [provider]  Potassium Chloride ER 20 MEQ TBCR Take 10 mEq by mouth daily as needed. With lasix 10/06/15  Yes Sowles, Drue Stager, MD  repaglinide (PRANDIN) 2 MG tablet Take 1 tablet (2 mg total) by mouth 3 (three) times daily. 07/02/18  Yes Sowles, Drue Stager, MD  vitamin C (ASCORBIC ACID) 500 MG tablet Take 500 mg by mouth daily.   Yes [provider]  Zinc  Acetate 50 MG CAPS Take by mouth daily.    Yes [provider]  telmisartan (MICARDIS) 40 MG tablet Take 1 tablet (40 mg total) by mouth daily. 07/22/18   Steele Sizer, MD      VITAL SIGNS:  Blood pressure (!) 157/76, pulse 84, temperature 99.1 F (37.3 C), temperature source Oral, resp. rate 18, height 5\' 5"  (1.651 m), weight 90.7 kg, SpO2 100 %.  PHYSICAL EXAMINATION:  Physical Exam  GENERAL:  84 y.o.-year-old patient lying in the bed with no acute distress.  EYES: Pupils equal, round, reactive to light and accommodation. No scleral icterus. Extraocular muscles intact.  HEENT: right lower lip abrasion, normocephalic. Oropharynx and nasopharynx clear. Right face abrasion NECK:  Supple, no jugular venous distention. No thyroid enlargement, no tenderness.  LUNGS: Normal breath sounds bilaterally, no wheezing, rales,rhonchi or crepitation. No use of accessory muscles of respiration.  CARDIOVASCULAR: Regular rate and rhythm, S1, S2 normal. No murmurs, rubs, or gallops.  ABDOMEN: Soft, nondistended, nontender. Bowel sounds present. No organomegaly or mass.  EXTREMITIES: No pedal edema, cyanosis, or clubbing.  NEUROLOGIC: Cranial nerves II through XII are intact. Muscle strength 5/5 in all extremities. Sensation intact. Gait not checked.  PSYCHIATRIC: The patient is alert and oriented x 3.  Normal affect and good eye contact. SKIN: No obvious rash, lesion, or ulcer.   LABORATORY PANEL:   CBC Recent Labs  Lab 07/24/18 1326  WBC 9.7  HGB 11.2*  HCT 35.2*  PLT 204   ------------------------------------------------------------------------------------------------------------------  Chemistries  Recent Labs  Lab 07/24/18 1326  NA 141  K 4.2  CL 106  CO2 21*  GLUCOSE 142*  BUN 17  CREATININE 0.84  CALCIUM 9.1  AST 25  ALT 12  ALKPHOS 47  BILITOT 0.7    ------------------------------------------------------------------------------------------------------------------  Cardiac Enzymes No results for input(s): TROPONINI in the last 168 hours. ------------------------------------------------------------------------------------------------------------------  RADIOLOGY:  Ct Head Wo Contrast  Result Date: 07/24/2018 CLINICAL DATA:  Known subdural hematoma, follow-up, fell in parking lot EXAM: CT HEAD WITHOUT CONTRAST TECHNIQUE: Contiguous axial images were obtained from the base of the skull through the vertex without intravenous contrast. Sagittal and coronal MPR images reconstructed from axial data set. COMPARISON:  Study at 1847 hours compared to 1337 hours FINDINGS: Brain: Mild atrophy. Normal ventricular morphology. No midline shift or mass effect. Small vessel chronic ischemic changes of deep cerebral white matter. Again identified parafalcine subdural hematoma up to 6 mm thick, previously 7 mm. No new areas of hemorrhage or infarction. No mass lesion. Vascular: Atherosclerotic calcification of internal carotid arteries bilaterally at skull base Skull: Intact Sinuses/Orbits: Clear Other: N/A IMPRESSION: Stable appearance of a small parafalcine subdural hematoma measuring 6 mm thick. No midline shift. Underlying atrophy and small vessel chronic ischemic changes of deep cerebral white matter. No new intracranial abnormalities. Electronically Signed   By: Lavonia Dana M.D.   On: 07/24/2018 19:10   Ct Head Wo Contrast  Result Date: 07/24/2018 CLINICAL DATA:  84 year old who fell earlier today and has RIGHT facial swelling and neck pain. Laceration to the lip. Patient amnestic to the event. Initial encounter. EXAM: CT HEAD WITHOUT CONTRAST CT MAXILLOFACIAL WITHOUT CONTRAST CT CERVICAL SPINE WITHOUT CONTRAST TECHNIQUE: Multidetector CT imaging of the head, cervical spine, and maxillofacial structures were performed using the standard protocol without  intravenous contrast. Multiplanar CT image reconstructions of the cervical spine and maxillofacial structures were also generated. A metallic BB was placed on the right temple in order to reliably differentiate right from left. COMPARISON:  None. FINDINGS: CT HEAD FINDINGS Brain: RIGHT parafalcine subdural hematoma extending upward to near the vertex, maximum thickness approximately 7 mm, without associated mass effect or midline shift. No evidence of parenchymal hemorrhage or hematoma. No evidence of subarachnoid hemorrhage or intraventricular hemorrhage. Ventricular system normal in size and appearance for age. Mild age-appropriate cortical atrophy. Moderate to severe changes of small vessel disease of the white matter diffusely. Vascular: Severe BILATERAL carotid siphon and vertebrobasilar atherosclerosis. No hyperdense vessel. Skull: No skull fracture or other focal osseous abnormality involving the skull. Other: None. CT MAXILLOFACIAL FINDINGS Osseous: No fractures identified involving the facial bones. Temporomandibular joints anatomically aligned without significant degenerative change. Osseous demineralization. Edentulous. Orbits: No orbital fractures. No evidence of orbital hemorrhage. Extraocular muscles normal in appearance throughout. Hyperdensity in both lens, query cataracts. Senile calcification involving the RIGHT globe. Sinuses: Opacification of a solitary LEFT POSTERIOR ethmoid air cell. Paranasal sinuses otherwise well aerated throughout. BILATERAL middle ear cavities and BILATERAL mastoid air cells well-aerated. Bony nasal septal deviation to the RIGHT. Soft  tissues: Hematoma in the subcutaneous tissues of the RIGHT cheek measuring approximately 2.7 x 1.7 cm. Small hematoma involving the RIGHT side of the cheek in measuring approximately 0.8 x 1.3 cm. CT CERVICAL SPINE FINDINGS Alignment: Anatomic POSTERIOR alignment. Straightening of the usual lordosis. Facet joints anatomically aligned  throughout with diffuse degenerative changes. Skull base and vertebrae: No fractures identified involving the cervical spine. Coronal reformatted images demonstrate an intact craniocervical junction, intact dens and intact lateral masses throughout. Benign bone island in the LEFT lateral arch of C1. Soft tissues and spinal canal: No evidence of paraspinous or spinal canal hematoma. No evidence of spinal stenosis. Disc levels: Moderate disc space narrowing at C5-6 and mild disc space narrowing at C4-5. No visible disc protrusions or extrusions on the soft tissue window images. Combination of facet and uncinate hypertrophy account for multilevel foraminal stenoses including mild RIGHT C2-3, mild BILATERAL C3-4, severe BILATERAL C4-5, moderate BILATERAL C5-6. Upper chest: Visualized lung apices clear. Mild atherosclerosis involving the visualized great vessels. Other: Mild BILATERAL cervical carotid atherosclerosis. IMPRESSION: 1. Small RIGHT parafalcine subdural hematoma, maximum thickness 7 mm, without associated mass effect or midline shift. No evidence of hemorrhage or hematoma elsewhere. 2. No acute intracranial abnormalities otherwise. 3. Mild age-appropriate cortical atrophy and moderate to severe chronic microvascular ischemic changes of the white matter. 4. No facial bone fractures identified. 5. Hematoma in the subcutaneous tissues of the RIGHT cheek and in the RIGHT side of the cheek. 6. No cervical spine fractures identified. 7. Multilevel degenerative disc disease, spondylosis and facet degenerative changes with multilevel foraminal stenoses as detailed above. I telephoned these results at the time of interpretation on 07/24/2018 at 2:18 pm to Dr. Merlyn Lot, who verbally acknowledged these results. Electronically Signed   By: Evangeline Dakin M.D.   On: 07/24/2018 14:20   Ct Cervical Spine Wo Contrast  Result Date: 07/24/2018 CLINICAL DATA:  84 year old who fell earlier today and has RIGHT facial  swelling and neck pain. Laceration to the lip. Patient amnestic to the event. Initial encounter. EXAM: CT HEAD WITHOUT CONTRAST CT MAXILLOFACIAL WITHOUT CONTRAST CT CERVICAL SPINE WITHOUT CONTRAST TECHNIQUE: Multidetector CT imaging of the head, cervical spine, and maxillofacial structures were performed using the standard protocol without intravenous contrast. Multiplanar CT image reconstructions of the cervical spine and maxillofacial structures were also generated. A metallic BB was placed on the right temple in order to reliably differentiate right from left. COMPARISON:  None. FINDINGS: CT HEAD FINDINGS Brain: RIGHT parafalcine subdural hematoma extending upward to near the vertex, maximum thickness approximately 7 mm, without associated mass effect or midline shift. No evidence of parenchymal hemorrhage or hematoma. No evidence of subarachnoid hemorrhage or intraventricular hemorrhage. Ventricular system normal in size and appearance for age. Mild age-appropriate cortical atrophy. Moderate to severe changes of small vessel disease of the white matter diffusely. Vascular: Severe BILATERAL carotid siphon and vertebrobasilar atherosclerosis. No hyperdense vessel. Skull: No skull fracture or other focal osseous abnormality involving the skull. Other: None. CT MAXILLOFACIAL FINDINGS Osseous: No fractures identified involving the facial bones. Temporomandibular joints anatomically aligned without significant degenerative change. Osseous demineralization. Edentulous. Orbits: No orbital fractures. No evidence of orbital hemorrhage. Extraocular muscles normal in appearance throughout. Hyperdensity in both lens, query cataracts. Senile calcification involving the RIGHT globe. Sinuses: Opacification of a solitary LEFT POSTERIOR ethmoid air cell. Paranasal sinuses otherwise well aerated throughout. BILATERAL middle ear cavities and BILATERAL mastoid air cells well-aerated. Bony nasal septal deviation to the RIGHT. Soft  tissues: Hematoma in the  subcutaneous tissues of the RIGHT cheek measuring approximately 2.7 x 1.7 cm. Small hematoma involving the RIGHT side of the cheek in measuring approximately 0.8 x 1.3 cm. CT CERVICAL SPINE FINDINGS Alignment: Anatomic POSTERIOR alignment. Straightening of the usual lordosis. Facet joints anatomically aligned throughout with diffuse degenerative changes. Skull base and vertebrae: No fractures identified involving the cervical spine. Coronal reformatted images demonstrate an intact craniocervical junction, intact dens and intact lateral masses throughout. Benign bone island in the LEFT lateral arch of C1. Soft tissues and spinal canal: No evidence of paraspinous or spinal canal hematoma. No evidence of spinal stenosis. Disc levels: Moderate disc space narrowing at C5-6 and mild disc space narrowing at C4-5. No visible disc protrusions or extrusions on the soft tissue window images. Combination of facet and uncinate hypertrophy account for multilevel foraminal stenoses including mild RIGHT C2-3, mild BILATERAL C3-4, severe BILATERAL C4-5, moderate BILATERAL C5-6. Upper chest: Visualized lung apices clear. Mild atherosclerosis involving the visualized great vessels. Other: Mild BILATERAL cervical carotid atherosclerosis. IMPRESSION: 1. Small RIGHT parafalcine subdural hematoma, maximum thickness 7 mm, without associated mass effect or midline shift. No evidence of hemorrhage or hematoma elsewhere. 2. No acute intracranial abnormalities otherwise. 3. Mild age-appropriate cortical atrophy and moderate to severe chronic microvascular ischemic changes of the white matter. 4. No facial bone fractures identified. 5. Hematoma in the subcutaneous tissues of the RIGHT cheek and in the RIGHT side of the cheek. 6. No cervical spine fractures identified. 7. Multilevel degenerative disc disease, spondylosis and facet degenerative changes with multilevel foraminal stenoses as detailed above. I telephoned  these results at the time of interpretation on 07/24/2018 at 2:18 pm to Dr. Merlyn Lot, who verbally acknowledged these results. Electronically Signed   By: Evangeline Dakin M.D.   On: 07/24/2018 14:20   Mr Jeri Cos XA Contrast  Result Date: 07/24/2018 CLINICAL DATA:  Syncope.  Small acute subdural hematoma. EXAM: MRI HEAD WITHOUT AND WITH CONTRAST TECHNIQUE: Multiplanar, multiecho pulse sequences of the brain and surrounding structures were obtained without and with intravenous contrast. CONTRAST:  9 mL Gadavist COMPARISON:  Head CT 07/24/2018 FINDINGS: Brain: There is a thin subdural hematoma along the length of the falx cerebri. No midline shift or other mass effect. No acute ischemia. There is early confluent hyperintensity within the periventricular white matter, consistent with chronic ischemic microangiopathy. 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 There is mild hyperintense T2-weighted signal within the overlying brain. 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 Vascular: The major intracranial arterial and venous sinus flow voids are preserved. Skull and upper cervical spine: The visualized skull base, calvarium, upper cervical spine and extracranial soft tissues are normal. Sinuses/Orbits: No fluid levels or advanced mucosal thickening of the visualized paranasal sinuses. No mastoid or middle ear effusion. The orbits are normal. Other: None IMPRESSION: 1. Thin parafalcine subdural hematoma, unchanged compared to the earlier CT. 2. 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 Electronically Signed   By: Ulyses Jarred M.D.   On: 07/24/2018 22:40   Dg Chest Portable 1 View  Result Date: 07/24/2018 CLINICAL DATA:  Syncope. EXAM: PORTABLE CHEST 1 VIEW COMPARISON:  09/06/2007 FINDINGS: The heart size and mediastinal contours are within normal limits. Both lungs are clear.  Chronic degenerative changes of both shoulders. Aortic atherosclerosis. IMPRESSION: 1. No acute cardiopulmonary disease. 2. Aortic atherosclerosis. Electronically Signed   By: Lorriane Shire M.D.  On: 07/24/2018 14:14   Ct Maxillofacial Wo Contrast  Result Date: 07/24/2018 CLINICAL DATA:  84 year old who fell earlier today and has RIGHT facial swelling and neck pain. Laceration to the lip. Patient amnestic to the event. Initial encounter. EXAM: CT HEAD WITHOUT CONTRAST CT MAXILLOFACIAL WITHOUT CONTRAST CT CERVICAL SPINE WITHOUT CONTRAST TECHNIQUE: Multidetector CT imaging of the head, cervical spine, and maxillofacial structures were performed using the standard protocol without intravenous contrast. Multiplanar CT image reconstructions of the cervical spine and maxillofacial structures were also generated. A metallic BB was placed on the right temple in order to reliably differentiate right from left. COMPARISON:  None. FINDINGS: CT HEAD FINDINGS Brain: RIGHT parafalcine subdural hematoma extending upward to near the vertex, maximum thickness approximately 7 mm, without associated mass effect or midline shift. No evidence of parenchymal hemorrhage or hematoma. No evidence of subarachnoid hemorrhage or intraventricular hemorrhage. Ventricular system normal in size and appearance for age. Mild age-appropriate cortical atrophy. Moderate to severe changes of small vessel disease of the white matter diffusely. Vascular: Severe BILATERAL carotid siphon and vertebrobasilar atherosclerosis. No hyperdense vessel. Skull: No skull fracture or other focal osseous abnormality involving the skull. Other: None. CT MAXILLOFACIAL FINDINGS Osseous: No fractures identified involving the facial bones. Temporomandibular joints anatomically aligned without significant degenerative change. Osseous demineralization. Edentulous. Orbits: No orbital fractures. No evidence of orbital hemorrhage. Extraocular muscles normal in appearance  throughout. Hyperdensity in both lens, query cataracts. Senile calcification involving the RIGHT globe. Sinuses: Opacification of a solitary LEFT POSTERIOR ethmoid air cell. Paranasal sinuses otherwise well aerated throughout. BILATERAL middle ear cavities and BILATERAL mastoid air cells well-aerated. Bony nasal septal deviation to the RIGHT. Soft tissues: Hematoma in the subcutaneous tissues of the RIGHT cheek measuring approximately 2.7 x 1.7 cm. Small hematoma involving the RIGHT side of the cheek in measuring approximately 0.8 x 1.3 cm. CT CERVICAL SPINE FINDINGS Alignment: Anatomic POSTERIOR alignment. Straightening of the usual lordosis. Facet joints anatomically aligned throughout with diffuse degenerative changes. Skull base and vertebrae: No fractures identified involving the cervical spine. Coronal reformatted images demonstrate an intact craniocervical junction, intact dens and intact lateral masses throughout. Benign bone island in the LEFT lateral arch of C1. Soft tissues and spinal canal: No evidence of paraspinous or spinal canal hematoma. No evidence of spinal stenosis. Disc levels: Moderate disc space narrowing at C5-6 and mild disc space narrowing at C4-5. No visible disc protrusions or extrusions on the soft tissue window images. Combination of facet and uncinate hypertrophy account for multilevel foraminal stenoses including mild RIGHT C2-3, mild BILATERAL C3-4, severe BILATERAL C4-5, moderate BILATERAL C5-6. Upper chest: Visualized lung apices clear. Mild atherosclerosis involving the visualized great vessels. Other: Mild BILATERAL cervical carotid atherosclerosis. IMPRESSION: 1. Small RIGHT parafalcine subdural hematoma, maximum thickness 7 mm, without associated mass effect or midline shift. No evidence of hemorrhage or hematoma elsewhere. 2. No acute intracranial abnormalities otherwise. 3. Mild age-appropriate cortical atrophy and moderate to severe chronic microvascular ischemic changes of  the white matter. 4. No facial bone fractures identified. 5. Hematoma in the subcutaneous tissues of the RIGHT cheek and in the RIGHT side of the cheek. 6. No cervical spine fractures identified. 7. Multilevel degenerative disc disease, spondylosis and facet degenerative changes with multilevel foraminal stenoses as detailed above. I telephoned these results at the time of interpretation on 07/24/2018 at 2:18 pm to Dr. Merlyn Lot, who verbally acknowledged these results. Electronically Signed   By: Evangeline Dakin M.D.   On: 07/24/2018 14:20  IMPRESSION AND PLAN:   1.  Syncope with collapse - Initial CT brain negative for neurologic findings - 7 mm subdural hematoma present initially -Dr. Lacinda Axon with neurosurgery consulted by the ED physician -We will get carotid Dopplers -Echocardiogram -Continue neurologic checks - Normal saline infusing to peripheral IV at 75 cc/h -MRI brain in the a.m.  2. subdural hematoma - Will hold all blood thinners - CT brain repeated after 6 hours with decrease size of subdural hematoma -Continue neurologic checks -Dr. Lacinda Axon consulted by the ED physician with aforementioned recommendations  3.  Dehydration - Gentle rehydration with normal saline at 75 cc/h to peripheral IV  4.  Elevated troponin - Echocardiogram in the a.m. -Continue to trend troponin levels - Cardiology consulted for further recommendations - Telemetry monitoring -Repeat CBC and BMP in the a.m.  5. diabetes mellitus - Sliding scale insulin - Metformin continued  DVT prophylaxis with SCDs and PPI prophylaxis    All the records are reviewed and case discussed with ED provider. The plan of care was discussed in details with the patient (and family). I answered all questions. The patient agreed to proceed with the above mentioned plan. Further management will depend upon hospital course.   CODE STATUS: Full code  TOTAL TIME TAKING CARE OF THIS PATIENT: 45 minutes.     Mabel on 07/24/2018 at 11:18 PM  Pager - [phone]  After 6pm go to [email or web address] - [employer] [facility]  Office  [phone]  CC: Primary care physician; Steele Sizer, MD   Note: This dictation was prepared with Dragon dictation along with smaller phrase technology. Any transcriptional errors that result from this process are unintentional.

## 2018-07-24 NOTE — Progress Notes (Signed)
Neurosurgery was consulted by Ms. Medders and a syncopal episode today.  A CT of the head was obtained in the emergency department and did reveal a small amount of acute blood along the falx consistent with subdural hematoma.  I reviewed his imaging and there is a minimal amount of blood without any mass-effect.  She was on aspirin and I recommend holding this at this time.  We did discuss a repeat CT scan of the head in 6 hours for confirmation of stability.  No intervention is recommended at this time.  Her neurologic exam is at baseline.  Plan is admission for syncopal evaluation and we are available to see as an inpatient.

## 2018-07-25 ENCOUNTER — Inpatient Hospital Stay: Payer: Medicare Other

## 2018-07-25 ENCOUNTER — Inpatient Hospital Stay (HOSPITAL_COMMUNITY)
Admit: 2018-07-25 | Discharge: 2018-07-25 | Disposition: A | Discharge status: Home / Self Care | Payer: Medicare Other | Attending: Nurse Practitioner | Admitting: Nurse Practitioner

## 2018-07-25 DIAGNOSIS — S065X9A Traumatic subdural hemorrhage with loss of consciousness of unspecified duration, initial encounter: Secondary | ICD-10-CM

## 2018-07-25 DIAGNOSIS — R55 Syncope and collapse: Secondary | ICD-10-CM

## 2018-07-25 DIAGNOSIS — S065XAA Traumatic subdural hemorrhage with loss of consciousness status unknown, initial encounter: Secondary | ICD-10-CM

## 2018-07-25 LAB — GLUCOSE, CAPILLARY
Glucose-Capillary: 103 mg/dL — ABNORMAL HIGH (ref 70–99)
Glucose-Capillary: 133 mg/dL — ABNORMAL HIGH (ref 70–99)
Glucose-Capillary: 103 mg/dL — ABNORMAL HIGH (ref 70–99)
Glucose-Capillary: 127 mg/dL — ABNORMAL HIGH (ref 70–99)

## 2018-07-25 LAB — PROTIME-INR
INR: 1.3 — ABNORMAL HIGH (ref 0.8–1.2)
Prothrombin Time: 15.9 seconds — ABNORMAL HIGH (ref 11.4–15.2)

## 2018-07-25 LAB — CBC
HCT: 34.7 % — ABNORMAL LOW (ref 36.0–46.0)
Hemoglobin: 11.2 g/dL — ABNORMAL LOW (ref 12.0–15.0)
MCH: 31.4 pg (ref 26.0–34.0)
MCHC: 32.3 g/dL (ref 30.0–36.0)
MCV: 97.2 fL (ref 80.0–100.0)
Platelets: 189 10*3/uL (ref 150–400)
RBC: 3.57 MIL/uL — ABNORMAL LOW (ref 3.87–5.11)
RDW: 14.8 % (ref 11.5–15.5)
WBC: 7.9 10*3/uL (ref 4.0–10.5)
nRBC: 0 % (ref 0.0–0.2)

## 2018-07-25 LAB — ECHOCARDIOGRAM COMPLETE
Height: 65 in
Weight: 3200 oz

## 2018-07-25 LAB — BASIC METABOLIC PANEL
Anion gap: 8 (ref 5–15)
BUN: 10 mg/dL (ref 8–23)
CO2: 26 mmol/L (ref 22–32)
Calcium: 8.6 mg/dL — ABNORMAL LOW (ref 8.9–10.3)
Chloride: 107 mmol/L (ref 98–111)
Creatinine, Ser: 0.63 mg/dL (ref 0.44–1.00)
GFR calc Af Amer: >60 mL/min (ref >60)
GFR calc non Af Amer: >60 mL/min (ref >60)
Glucose, Bld: 107 mg/dL — ABNORMAL HIGH (ref 70–99)
Potassium: 3.5 mmol/L (ref 3.5–5.1)
Sodium: 141 mmol/L (ref 135–145)

## 2018-07-25 LAB — TROPONIN I (HIGH SENSITIVITY)
Troponin I (High Sensitivity): 37 ng/L — ABNORMAL HIGH (ref <18)
Troponin I (High Sensitivity): 40 ng/L — ABNORMAL HIGH (ref <18)

## 2018-07-25 LAB — MAGNESIUM: Magnesium: 1.5 mg/dL — ABNORMAL LOW (ref 1.7–2.4)

## 2018-07-25 LAB — TSH
TSH: 1.097 u[IU]/mL (ref 0.350–4.500)
TSH: 1.095 u[IU]/mL (ref 0.350–4.500)

## 2018-07-25 IMAGING — US BILATERAL CAROTID DUPLEX ULTRASOUND
1 series · 13 of 24 positions shown · non-contrast
Comparison: Brain MRI [DATE]

CLINICAL DATA: 84-year-old female with syncope and collapse

EXAM:
BILATERAL CAROTID DUPLEX ULTRASOUND
TECHNIQUE: Gray scale imaging, color Doppler and duplex ultrasound were
performed of bilateral carotid and vertebral arteries in the neck.

[Series 1: bilateral carotid duplex ultrasound · 13 of 64 slices shown]
[im 1/64]
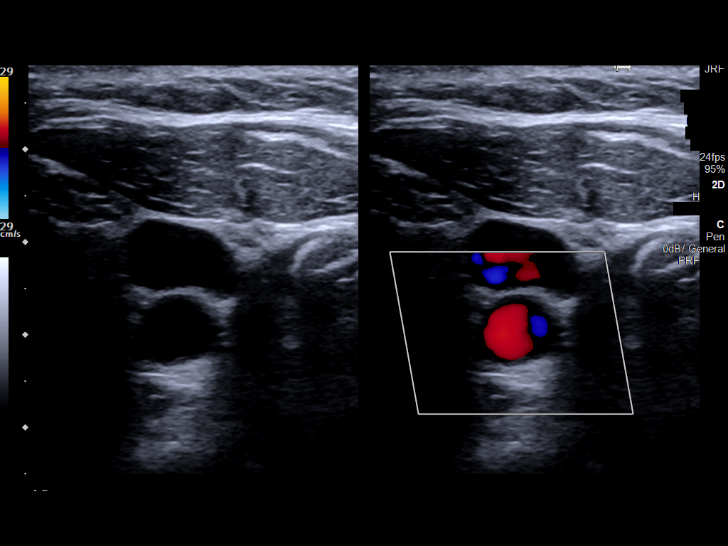
[im 6/64]
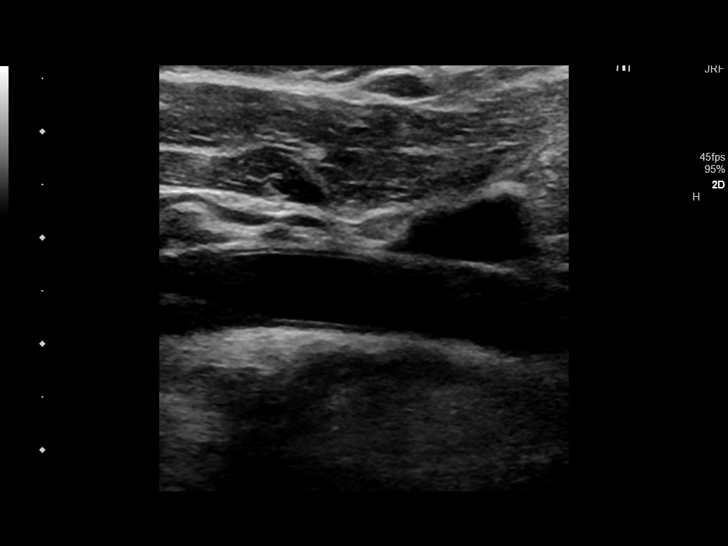
[im 11/64]
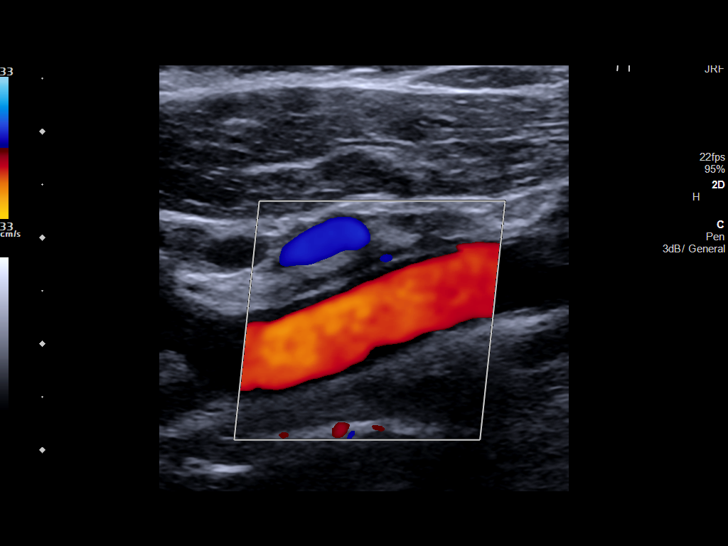
[im 17/64]
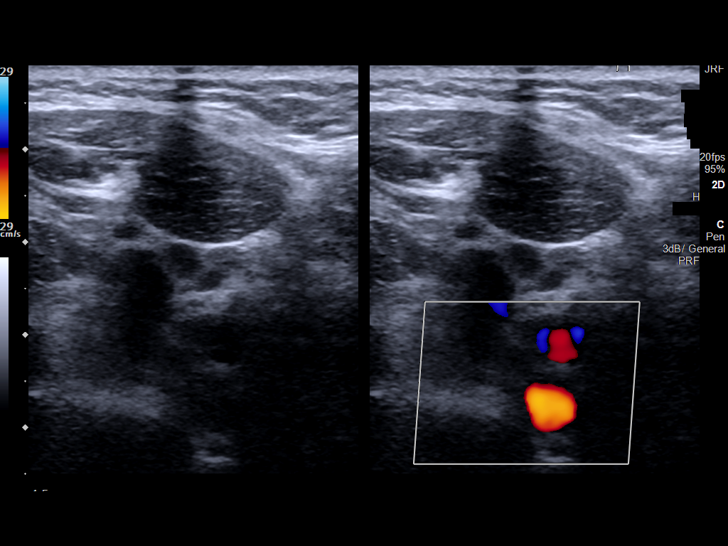
[im 22/64]
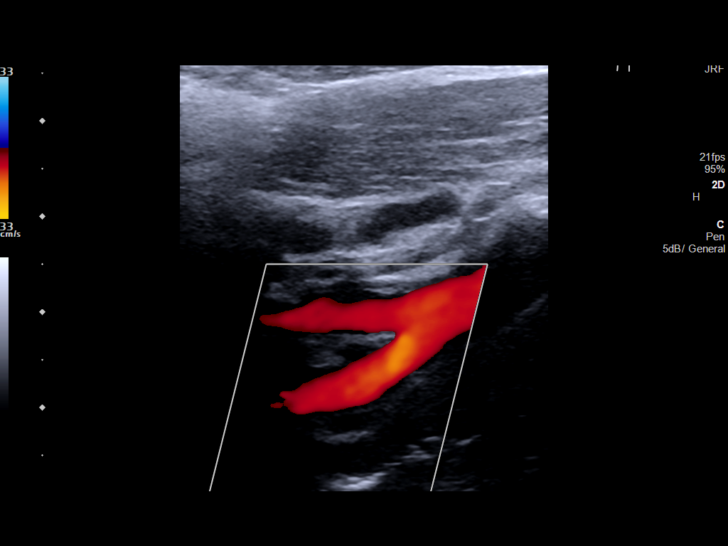
[im 28/64]
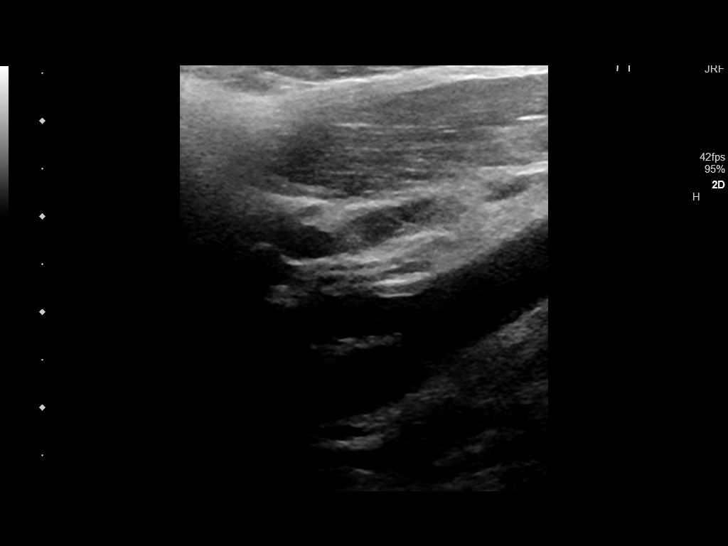
[im 33/64]
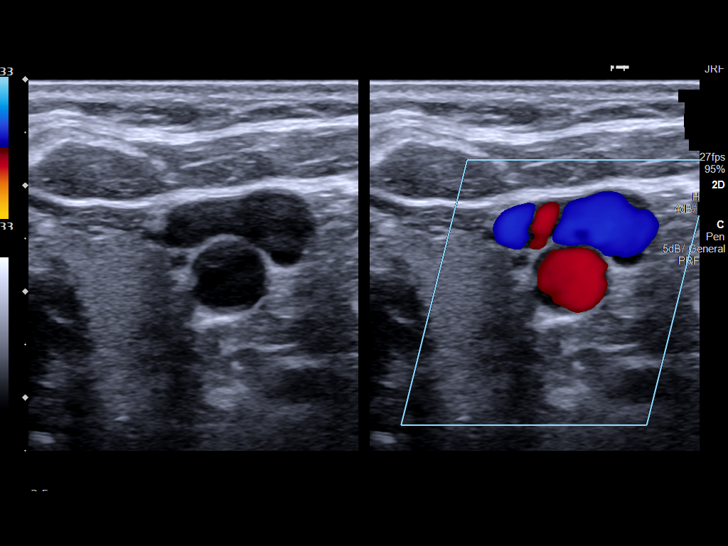
[im 36/64]
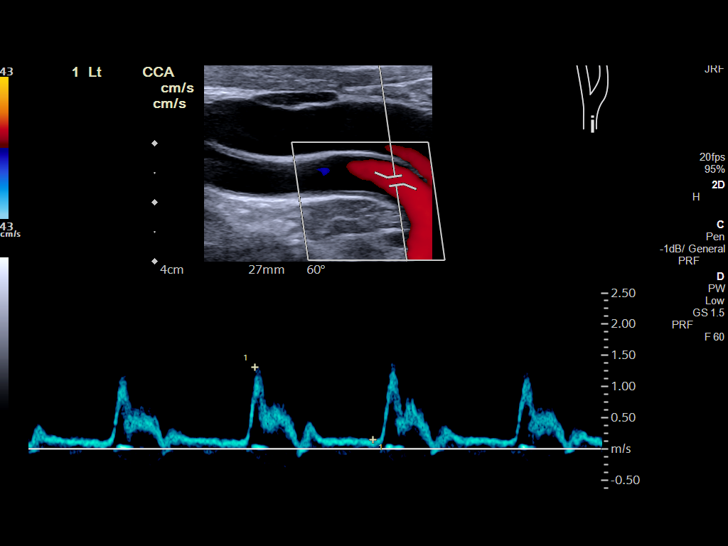
[im 42/64]
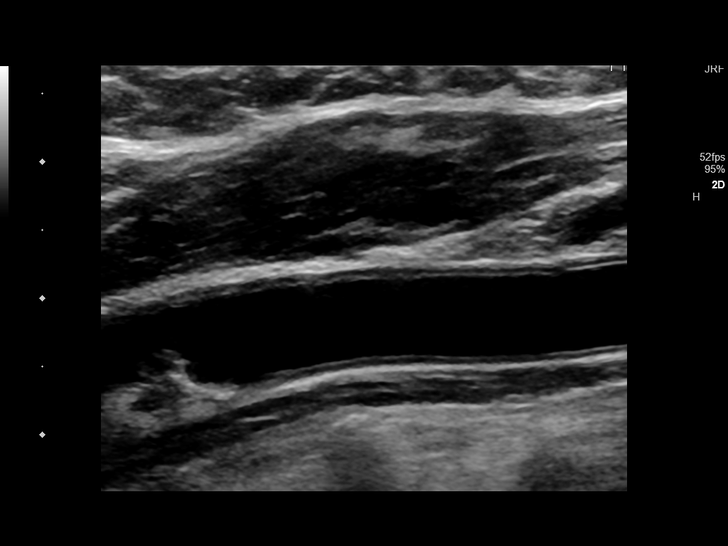
[im 47/64]
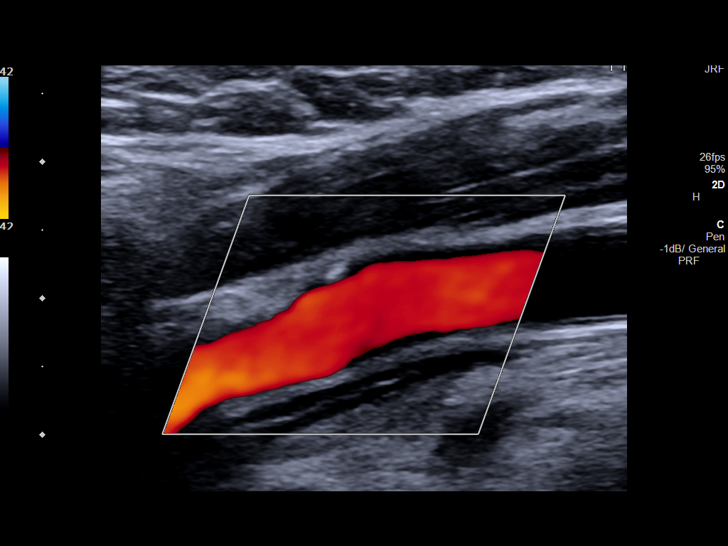
[im 53/64]
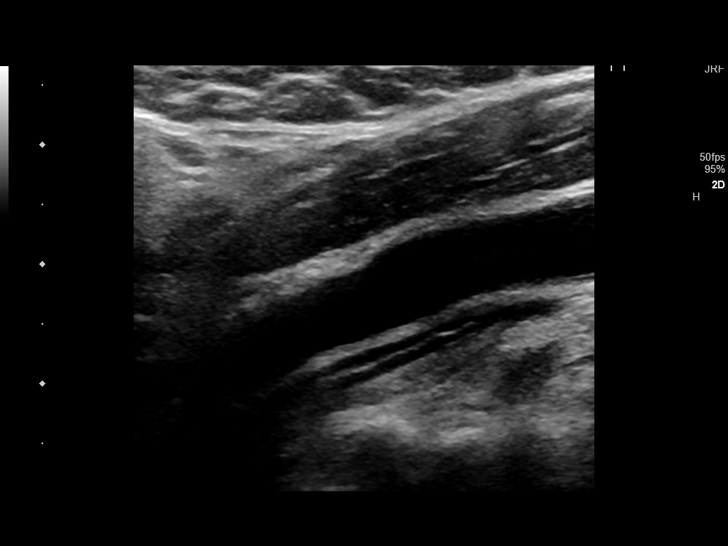
[im 58/64]
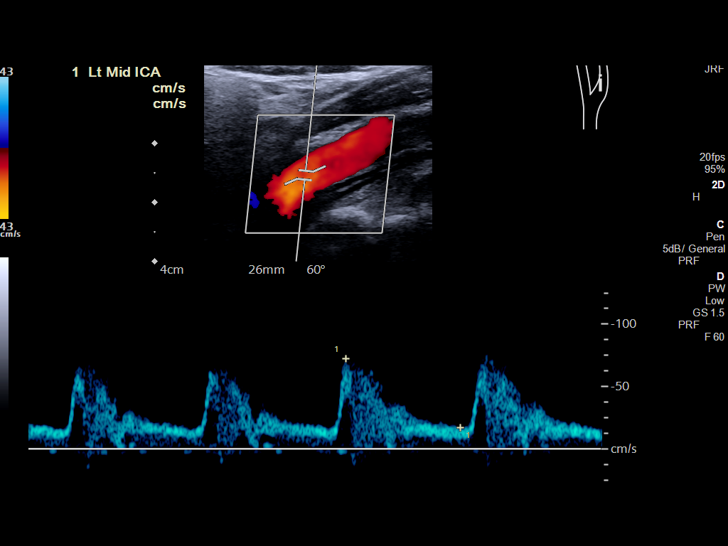
[im 64/64]
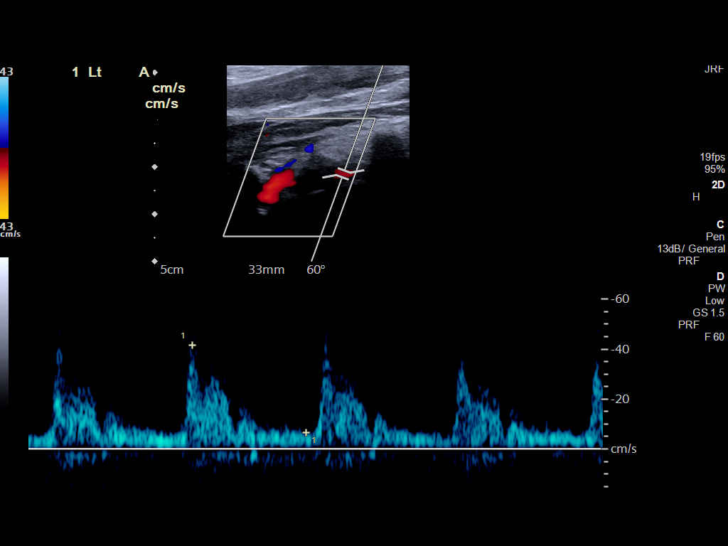

[13 of 24 positions shown; findings below may reference images not displayed]

FINDINGS: Criteria: Quantification of carotid stenosis is based on velocity
parameters that correlate the residual internal carotid diameter
with NASCET-based stenosis levels, using the diameter of the distal
internal carotid lumen as the denominator for stenosis measurement.

The following velocity measurements were obtained:

RIGHT
ICA: 85/22 cm/sec
CCA: 103/13 cm/sec

SYSTOLIC ICA/CCA RATIO:

ECA:  71 cm/sec

LEFT

ICA: 93/14 cm/sec

CCA: 85/12 cm/sec

SYSTOLIC ICA/CCA RATIO:

ECA:  81 cm/sec

RIGHT CAROTID ARTERY: No significant atherosclerotic plaque or
evidence of stenosis.

RIGHT VERTEBRAL ARTERY:  Patent with normal antegrade flow.

LEFT CAROTID ARTERY: No significant atherosclerotic plaque or
evidence of stenosis.

LEFT VERTEBRAL ARTERY:  Patent with normal antegrade flow.
IMPRESSION: Negative bilateral carotid duplex ultrasound. No significant
atherosclerotic plaque or evidence of stenosis.

## 2018-07-25 MED ORDER — AMLODIPINE BESYLATE 5 MG PO TABS
5.0000 mg | ORAL_TABLET | Freq: Every day | ORAL | Status: DC
  Administered 2018-07-25 – 2018-07-26 (×2): 5 mg via ORAL
  Filled 2018-07-25 (×2): qty 1

## 2018-07-25 MED ORDER — POTASSIUM CHLORIDE CRYS ER 20 MEQ PO TBCR
20.0000 meq | EXTENDED_RELEASE_TABLET | Freq: Two times a day (BID) | ORAL | Status: AC
  Administered 2018-07-25 – 2018-07-26 (×2): 20 meq via ORAL
  Filled 2018-07-25 (×2): qty 1

## 2018-07-25 MED ORDER — INSULIN ASPART 100 UNIT/ML ~~LOC~~ SOLN: 0.0000 [IU] | Freq: Every day | SUBCUTANEOUS | Status: DC

## 2018-07-25 MED ORDER — INSULIN ASPART 100 UNIT/ML ~~LOC~~ SOLN
0.0000 [IU] | Freq: Three times a day (TID) | SUBCUTANEOUS | Status: DC
  Administered 2018-07-25: 13:00:00 2 [IU] via SUBCUTANEOUS
  Filled 2018-07-25 (×2): qty 1

## 2018-07-25 NOTE — Consult Note (Signed)
Referring Physician:  No referring provider defined for this encounter.  Primary Physician:  Beverly Sizer, MD  Chief Complaint: Fall  History of Present Illness: Beverly [name] is a 84 y.o. female who presents as a consult for subdural hematoma that was found on imaging after she was evaluated in the ED for a fall.  Patient does not recall the fall and she is currently being evaluated for syncopal episode.  She is on daily aspirin but denies any anticoagulation medications. Denies headache, vision changes, weakness, confusion, speech difficulties, seizures.  Review of Systems:  A 10 point review of systems is negative, except for the pertinent positives and negatives detailed in the HPI.  Past Medical History: Past Medical History:  Diagnosis Date  . Allergy   . Arthritis   . Diabetes mellitus without complication ([place])   . Hyperlipidemia   . Hypertension     Past Surgical History: Past Surgical History:  Procedure Laterality Date  . ABDOMINAL HYSTERECTOMY      Allergies: Allergies as of 07/24/2018 - Review Complete 07/24/2018  Allergen Reaction Noted  . Lisinopril  06/02/2015    Medications:  Current Facility-Administered Medications:  .  0.9 %  sodium chloride infusion, , Intravenous, Continuous, Beverly [name], Beverly Dills, NP, Last Rate: 75 mL/hr at 07/25/18 0315 .  acetaminophen (TYLENOL) tablet 650 mg, 650 mg, Oral, Q6H PRN, 650 mg at 07/24/18 2331 **OR** acetaminophen (TYLENOL) suppository 650 mg, 650 mg, Rectal, Q6H PRN, Beverly [name], Beverly H, NP .  atorvastatin (LIPITOR) tablet 40 mg, 40 mg, Oral, QPM, Beverly [name], Beverly H, NP, 40 mg at 07/24/18 2330 .  ferrous sulfate tablet 325 mg, 325 mg, Oral, BID WC, Beverly [name], Beverly H, NP .  insulin aspart (novoLOG) injection 0-15 Units, 0-15 Units, Subcutaneous, TID WC, Beverly [name], Beverly H, NP .  insulin aspart (novoLOG) injection 0-5 Units, 0-5 Units, Subcutaneous, QHS, Beverly [name], Beverly H, NP .  irbesartan (AVAPRO) tablet 75 mg, 75 mg, Oral, Daily,  Beverly [name], Beverly H, NP, 75 mg at 07/24/18 2332 .  loratadine (CLARITIN) tablet 10 mg, 10 mg, Oral, Daily, Beverly [name], Beverly H, NP .  metFORMIN (GLUCOPHAGE) tablet 1,000 mg, 1,000 mg, Oral, BID WC, Beverly [name], Beverly H, NP .  montelukast (SINGULAIR) tablet 10 mg, 10 mg, Oral, QHS, Beverly [name], Beverly H, NP, 10 mg at 07/24/18 2332 .  ondansetron (ZOFRAN) tablet 4 mg, 4 mg, Oral, Q6H PRN **OR** ondansetron (ZOFRAN) injection 4 mg, 4 mg, Intravenous, Q6H PRN, Beverly [name], Beverly H, NP .  polyethylene glycol (MIRALAX / GLYCOLAX) packet 17 g, 17 g, Oral, Daily PRN, Beverly [name], Beverly H, NP .  repaglinide (PRANDIN) tablet 2 mg, 2 mg, Oral, TID, Beverly [name], Beverly H, NP .  sodium chloride flush (NS) 0.9 % injection 3 mL, 3 mL, Intravenous, Q12H, Beverly [name], Beverly H, NP, 3 mL at 07/24/18 2341 .  vitamin C (ASCORBIC ACID) tablet 500 mg, 500 mg, Oral, Daily, Beverly [name], Beverly Dills, NP   Social History: Social History   Tobacco Use  . Smoking status: Never Smoker  . Smokeless tobacco: Never Used  . Tobacco comment: smoking cessation materials not required  Substance Use Topics  . Alcohol use: No    Alcohol/week: 0.0 standard drinks  . Drug use: No    Family Medical History: Family History  Problem Relation Age of Onset  . Hypertension Mother   . Healthy Father   . Healthy Daughter   . Raynaud syndrome Daughter   . Hypertension Daughter   . Stroke Sister     Physical Examination: Vitals:  07/25/18 0414 07/25/18 0417  BP: (!) 151/69 (!) 160/87  Pulse: 80 92  Resp:    Temp:    SpO2: 98% 100%     General: Patient is well developed, well nourished, calm, collected, and in no apparent distress.  Psychiatric: Patient is non-anxious.  Head:  Pupils equal, round, and reactive to light.  ENT:  Oral mucosa appears well hydrated.  Neck:   Supple.  Full range of motion.  Respiratory: Patient is breathing without any difficulty.  Skin:   Ecchymosis noted around right eye, small skin laceration noted next to right eye.  Injury  to lower right lip with swelling.  NEUROLOGICAL:  General: In no acute distress.   Awake, alert, oriented to person, place, and time.  Pupils equal round and reactive to light.  Facial tone is symmetric.  Tongue protrusion is midline.  There is no pronator drift.   Strength: Side Biceps Triceps Deltoid Interossei Grip Wrist Ext. Wrist Flex.  R 5 5 5 5 5 5 5   L 5 5 5 5 5 5 5    Side Iliopsoas Quads Hamstring PF DF EHL  R 5 5 5 5 5 5   L 5 5 5 5 5 5    Reflexes are 2+ and symmetric at the biceps, triceps, brachioradialis, patella and achilles.   Bilateral upper and lower extremity sensation is intact to light touch and pin prick.  Clonus is not present.   Hoffman's is absent.  Imaging:  MRI HEAD WITHOUT AND WITH CONTRAST 07/24/2018 IMPRESSION: 1. Thin parafalcine subdural hematoma, unchanged compared to the earlier CT. 2. 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  CT head without contrast 07/24/2018  19:10 IMPRESSION: Stable appearance of a small parafalcine subdural hematoma measuring 6 mm thick. No midline shift. Underlying atrophy and small vessel chronic ischemic changes of deep cerebral white matter. No new intracranial abnormalities.   CT head without contrast 07/24/2018 14:20 IMPRESSION: 1. Small RIGHT parafalcine subdural hematoma, maximum thickness 44mm, without associated mass effect or midline shift. No evidence of hemorrhage or hematoma elsewhere. 2. No acute intracranial abnormalities otherwise. 3. Mild age-appropriate cortical atrophy and moderate to severe chronic microvascular ischemic changes of the white matter. 4. No facial bone fractures identified. 5. Hematoma in the subcutaneous tissues of the RIGHT cheek and in the RIGHT side of the cheek. 6. No cervical spine fractures identified. 7. Multilevel degenerative disc disease, spondylosis and facet degenerative changes with multilevel foraminal stenoses as detailed above.  Assessment and  Plan: [name] is a pleasant 84 y.o. female with subdural hematoma and meningioma.  Dr. Lacinda [name] has reviewed imaging.  No change in hematoma noted on repeat CT.  Neuro exam intact.  Recommend continue syncopal evaluation.  She may follow-up in clinic in approximately 3 to 4 weeks to monitor progress and to discuss if surgical intervention will be appropriate for meningioma found on MRI imaging.  Marin Olp, PA-C Dept. of Neurosurgery

## 2018-07-25 NOTE — Progress Notes (Signed)
Family Meeting Note  Advance Directive:no  Today a meeting took place with the Patient.  The following clinical team members were present during this meeting:MD  The following were discussed:Patient's diagnosis: syncope SHF DM HTN , Patient's progosis: > 12 months and Goals for treatment: Full Code  Additional follow-up to be provided:chaplain to create AD   Time spent during discussion:16 minutes  Bettey Costa, MD

## 2018-07-25 NOTE — Plan of Care (Signed)

## 2018-07-25 NOTE — Consult Note (Signed)
Cardiology Consultation:   Patient ID: Beverly [name] MRN: [id number]; DOB: 23-Feb-1934  Admit date: 07/24/2018 Date of Consult: 07/25/2018  Primary Care Provider: Steele Sizer, MD Primary Cardiologist: [facility], Dr. Saunders Revel rounding Primary Electrophysiologist:  None    Patient Profile:   Beverly [name] is a 84 y.o. female with a hx of HTN, HLD, DM2, and arthritis who is being seen today for the evaluation of elevated troponin after syncope and collapse at the request of Gardiner Barefoot, NP.  History of Present Illness:   Beverly [name] with no reported past history of cardiac disease or arrhythmia. She denied any history of stroke in the past. No history reported of pre-syncope or syncope.   She reportedly had an episode of syncope and collapse on 7/21 while walking back to her car from inside of the air-conditioned office of [employer], where she had been for an appointment for her granddaughter. She was reportedly running errands that day, and after the pediatrics office, her plan was to pick up her new BP medication. She made it half way across the pediatrics parking lot, lost consciousness, and then woke to discover she hit her head on the concrete. She denied any symptoms leading up to the event of pre-syncope (vision changes, speech changes, visual / auditory aura). No chest pain, nausea, emesis, SOB, or diaphoresis. No recent cough. She also stated she has never had a syncopal event in the past and denied any previous symptoms of pre-syncope.  She denied any reduced oral intake that day, reporting multiple glasses of juice and water earlier in the day. She also denied any significant time spent out in the heat, again having reportedly been waiting for her granddaughter inside of the pediatrics office. No current or past report of CP. No past history of orthopnea, SOB/DOE, or LEE.  Heart Pathway Score:     Past Medical History:  Diagnosis Date  . Allergy   . Arthritis   .  Diabetes mellitus without complication ([place])   . Hyperlipidemia   . Hypertension     Past Surgical History:  Procedure Laterality Date  . ABDOMINAL HYSTERECTOMY       Home Medications:  Prior to Admission medications   Medication Sig Start Date End Date Taking? Authorizing Provider  alendronate (FOSAMAX) 35 MG tablet TAKE 1 TABLET BY MOUTH WEEKLY WITH A FULL GLASS OF WATER ON AN EMPTY STOMACH. 05/02/18  Yes Sowles, Drue Stager, MD  aspirin 81 MG tablet Take by mouth.   Yes [provider]  atorvastatin (LIPITOR) 40 MG tablet Take 1 tablet (40 mg total) by mouth daily. 07/02/18  Yes Sowles, Drue Stager, MD  Cholecalciferol (VITAMIN D) 2000 units CAPS Take 1 capsule (2,000 Units total) by mouth daily. 10/14/16  Yes Sowles, Drue Stager, MD  ferrous sulfate 325 (65 FE) MG tablet Take 325 mg by mouth 2 (two) times daily with a meal.    Yes [provider]  furosemide (LASIX) 40 MG tablet Take 1 tablet (40 mg total) by mouth daily as needed. 10/06/15  Yes Sowles, Drue Stager, MD  Glucosamine Sulfate 500 MG TABS Take 1 tablet by mouth once a week.    Yes [provider]  loratadine (CLARITIN) 10 MG tablet Take 1 tablet (10 mg total) by mouth daily. 10/06/15  Yes Sowles, Drue Stager, MD  metFORMIN (GLUCOPHAGE) 1000 MG tablet Take 1 tablet (1,000 mg total) by mouth 2 (two) times daily with a meal. 07/02/18  Yes Sowles, Drue Stager, MD  montelukast (SINGULAIR) 10 MG tablet  Take 1 tablet (10 mg total) by mouth at bedtime. 07/02/18  Yes Steele Sizer, MD  Multiple Vitamin (MULTI-VITAMINS) TABS Take by mouth.   Yes [provider]  Potassium Chloride ER 20 MEQ TBCR Take 10 mEq by mouth daily as needed. With lasix 10/06/15  Yes Sowles, Drue Stager, MD  repaglinide (PRANDIN) 2 MG tablet Take 1 tablet (2 mg total) by mouth 3 (three) times daily. 07/02/18  Yes Sowles, Drue Stager, MD  vitamin C (ASCORBIC ACID) 500 MG tablet Take 500 mg by mouth daily.   Yes [provider]  Zinc Acetate 50 MG CAPS  Take by mouth daily.    Yes [provider]  telmisartan (MICARDIS) 40 MG tablet Take 1 tablet (40 mg total) by mouth daily. 07/22/18   Steele Sizer, MD    Inpatient Medications: Scheduled Meds: . atorvastatin  40 mg Oral QPM  . ferrous sulfate  325 mg Oral BID WC  . insulin aspart  0-15 Units Subcutaneous TID WC  . insulin aspart  0-5 Units Subcutaneous QHS  . irbesartan  75 mg Oral Daily  . loratadine  10 mg Oral Daily  . metFORMIN  1,000 mg Oral BID WC  . montelukast  10 mg Oral QHS  . repaglinide  2 mg Oral TID  . sodium chloride flush  3 mL Intravenous Q12H  . vitamin C  500 mg Oral Daily   Continuous Infusions: . sodium chloride 75 mL/hr at 07/25/18 0315   PRN Meds: acetaminophen **OR** acetaminophen, ondansetron **OR** ondansetron (ZOFRAN) IV, polyethylene glycol  Allergies:    Allergies  Allergen Reactions  . Lisinopril     Social History:   Social History   Socioeconomic History  . Marital status: Widowed    Spouse name: [name]  . Number of children: 2  . Years of education: Not on file  . Highest education level: Bachelor's degree (e.g., BA, AB, BS)  Occupational History  . Occupation: Retired  Scientific laboratory technician  . Financial resource strain: Not hard at all  . Food insecurity    Worry: Never true    Inability: Never true  . Transportation needs    Medical: No    Non-medical: No  Tobacco Use  . Smoking status: Never Smoker  . Smokeless tobacco: Never Used  . Tobacco comment: smoking cessation materials not required  Substance and Sexual Activity  . Alcohol use: No    Alcohol/week: 0.0 standard drinks  . Drug use: No  . Sexual activity: Not Currently  Lifestyle  . Physical activity    Days per week: 3 days    Minutes per session: 40 min  . Stress: Not at all  Relationships  . Social Herbalist on phone: Patient refused    Gets together: Patient refused    Attends religious service: Patient refused    Active member of club or  organization: Patient refused    Attends meetings of clubs or organizations: Patient refused    Relationship status: Widowed  . Intimate partner violence    Fear of current or ex partner: No    Emotionally abused: No    Physically abused: No    Forced sexual activity: No  Other Topics Concern  . Not on file  Social History Narrative  . Not on file    Family History:    Family History  Problem Relation Age of Onset  . Hypertension Mother   . Healthy Father   . Healthy Daughter   . Raynaud  syndrome Daughter   . Hypertension Daughter   . Stroke Sister      ROS:  Please see the history of present illness.  Review of Systems  Constitutional: Negative for chills, diaphoresis, fever and malaise/fatigue.  HENT: Negative for tinnitus.   Eyes: Negative for blurred vision and double vision.  Respiratory: Negative for cough, shortness of breath and wheezing.   Cardiovascular: Negative for chest pain, palpitations, orthopnea, leg swelling and PND.  Gastrointestinal: Negative for abdominal pain, diarrhea and vomiting.  Musculoskeletal: Positive for falls.  Neurological: Positive for loss of consciousness. Negative for dizziness, speech change and focal weakness.  All other systems reviewed and are negative.   All other ROS reviewed and negative.     Physical Exam/Data:   Vitals:   07/24/18 2305 07/25/18 0411 07/25/18 0414 07/25/18 0417  BP: (!) 157/76 (!) 141/67 (!) 151/69 (!) 160/87  Pulse: 84 78 80 92  Resp: 18 16    Temp: 99.1 F (37.3 C) 98.7 F (37.1 C)    TempSrc: Oral Oral    SpO2: 100% 100% 98% 100%  Weight:      Height:        Intake/Output Summary (Last 24 hours) at 07/25/2018 0852 Last data filed at 07/25/2018 0429 Gross per 24 hour  Intake 305.59 ml  Output 450 ml  Net -144.41 ml   Last 3 Weights 07/24/2018 07/02/2018 02/27/2018  Weight (lbs) 200 lb 200 lb 8 oz 202 lb 12.8 oz  Weight (kg) 90.719 kg 90.946 kg 91.989 kg     Body mass index is 33.28 kg/m.   General:  Well nourished, well developed, in no acute distress HEENT: normal Neck: no JVD Vascular: No carotid bruits; radial pulses 2+ bilaterally Cardiac:  normal S1, S2; RRR; 1/6 systolic murmur Lungs:  clear to auscultation bilaterally, no wheezing, rhonchi or rales  Abd: soft, nontender, no hepatomegaly  Ext: no lower extremity edema Musculoskeletal:  No deformities, BUE and BLE strength normal and equal Skin: warm and dry. Ecchymosis  On face following fall  Neuro:  No focal abnormalities noted Psych:  Normal affect   EKG:  The EKG was personally reviewed and demonstrates:  First degree AVB, 96bpm Telemetry:  Telemetry was personally reviewed and demonstrates:  First degree AVB with rates 60-90s  Relevant CV Studies: Echo 07/25/2018  1. The left ventricle has normal systolic function with an ejection fraction of 60-65%. The cavity size was normal. There is moderately increased left ventricular wall thickness. Left ventricular diastolic Doppler parameters are consistent with impaired  relaxation. Elevated mean left atrial pressure.  2. The mitral valve is degenerative. Mild thickening of the mitral valve leaflet. Mild calcification of the mitral valve leaflet. There is moderate mitral annular calcification present.  3. The aortic valve was not well visualized. Mild thickening of the aortic valve. Mild calcification of the aortic valve. Mild stenosis of the aortic valve.  4. The aorta is normal in size and structure.  5. The interatrial septum was not well visualized.   Laboratory Data:  High Sensitivity Troponin:   Recent Labs  Lab 07/24/18 1326 07/24/18 1509 07/24/18 2300 07/25/18 0048  TROPONINIHS 25* 32* 40* 37*     Cardiac EnzymesNo results for input(s): TROPONINI in the last 168 hours. No results for input(s): TROPIPOC in the last 168 hours.  Chemistry Recent Labs  Lab 07/24/18 1326 07/25/18 0437  NA 141 141  K 4.2 3.5  CL 106 107  CO2 21* 26  GLUCOSE 142*  107*  BUN 17 10  CREATININE 0.84 0.63  CALCIUM 9.1 8.6*  GFRNONAA >60 >60  GFRAA >60 >60  ANIONGAP 14 8    Recent Labs  Lab 07/24/18 1326  PROT 6.7  ALBUMIN 3.6  AST 25  ALT 12  ALKPHOS 47  BILITOT 0.7   Hematology Recent Labs  Lab 07/24/18 1326 07/25/18 0437  WBC 9.7 7.9  RBC 3.59* 3.57*  HGB 11.2* 11.2*  HCT 35.2* 34.7*  MCV 98.1 97.2  MCH 31.2 31.4  MCHC 31.8 32.3  RDW 15.1 14.8  PLT 204 189   BNPNo results for input(s): BNP, PROBNP in the last 168 hours.  DDimer No results for input(s): DDIMER in the last 168 hours.   Radiology/Studies:  Ct Head Wo Contrast  Result Date: 07/24/2018 CLINICAL DATA:  Known subdural hematoma, follow-up, fell in parking lot EXAM: CT HEAD WITHOUT CONTRAST TECHNIQUE: Contiguous axial images were obtained from the base of the skull through the vertex without intravenous contrast. Sagittal and coronal MPR images reconstructed from axial data set. COMPARISON:  Study at 1847 hours compared to 1337 hours FINDINGS: Brain: Mild atrophy. Normal ventricular morphology. No midline shift or mass effect. Small vessel chronic ischemic changes of deep cerebral white matter. Again identified parafalcine subdural hematoma up to 6 mm thick, previously 7 mm. No new areas of hemorrhage or infarction. No mass lesion. Vascular: Atherosclerotic calcification of internal carotid arteries bilaterally at skull base Skull: Intact Sinuses/Orbits: Clear Other: N/A IMPRESSION: Stable appearance of a small parafalcine subdural hematoma measuring 6 mm thick. No midline shift. Underlying atrophy and small vessel chronic ischemic changes of deep cerebral white matter. No new intracranial abnormalities. Electronically Signed   By: Lavonia Dana M.D.   On: 07/24/2018 19:10   Ct Head Wo Contrast  Result Date: 07/24/2018 CLINICAL DATA:  84 year old who fell earlier today and has RIGHT facial swelling and neck pain. Laceration to the lip. Patient amnestic to the event. Initial  encounter. EXAM: CT HEAD WITHOUT CONTRAST CT MAXILLOFACIAL WITHOUT CONTRAST CT CERVICAL SPINE WITHOUT CONTRAST TECHNIQUE: Multidetector CT imaging of the head, cervical spine, and maxillofacial structures were performed using the standard protocol without intravenous contrast. Multiplanar CT image reconstructions of the cervical spine and maxillofacial structures were also generated. A metallic BB was placed on the right temple in order to reliably differentiate right from left. COMPARISON:  None. FINDINGS: CT HEAD FINDINGS Brain: RIGHT parafalcine subdural hematoma extending upward to near the vertex, maximum thickness approximately 7 mm, without associated mass effect or midline shift. No evidence of parenchymal hemorrhage or hematoma. No evidence of subarachnoid hemorrhage or intraventricular hemorrhage. Ventricular system normal in size and appearance for age. Mild age-appropriate cortical atrophy. Moderate to severe changes of small vessel disease of the white matter diffusely. Vascular: Severe BILATERAL carotid siphon and vertebrobasilar atherosclerosis. No hyperdense vessel. Skull: No skull fracture or other focal osseous abnormality involving the skull. Other: None. CT MAXILLOFACIAL FINDINGS Osseous: No fractures identified involving the facial bones. Temporomandibular joints anatomically aligned without significant degenerative change. Osseous demineralization. Edentulous. Orbits: No orbital fractures. No evidence of orbital hemorrhage. Extraocular muscles normal in appearance throughout. Hyperdensity in both lens, query cataracts. Senile calcification involving the RIGHT globe. Sinuses: Opacification of a solitary LEFT POSTERIOR ethmoid air cell. Paranasal sinuses otherwise well aerated throughout. BILATERAL middle ear cavities and BILATERAL mastoid air cells well-aerated. Bony nasal septal deviation to the RIGHT. Soft tissues: Hematoma in the subcutaneous tissues of the RIGHT cheek measuring approximately  2.7 x 1.7 cm. Small hematoma  involving the RIGHT side of the cheek in measuring approximately 0.8 x 1.3 cm. CT CERVICAL SPINE FINDINGS Alignment: Anatomic POSTERIOR alignment. Straightening of the usual lordosis. Facet joints anatomically aligned throughout with diffuse degenerative changes. Skull base and vertebrae: No fractures identified involving the cervical spine. Coronal reformatted images demonstrate an intact craniocervical junction, intact dens and intact lateral masses throughout. Benign bone island in the LEFT lateral arch of C1. Soft tissues and spinal canal: No evidence of paraspinous or spinal canal hematoma. No evidence of spinal stenosis. Disc levels: Moderate disc space narrowing at C5-6 and mild disc space narrowing at C4-5. No visible disc protrusions or extrusions on the soft tissue window images. Combination of facet and uncinate hypertrophy account for multilevel foraminal stenoses including mild RIGHT C2-3, mild BILATERAL C3-4, severe BILATERAL C4-5, moderate BILATERAL C5-6. Upper chest: Visualized lung apices clear. Mild atherosclerosis involving the visualized great vessels. Other: Mild BILATERAL cervical carotid atherosclerosis. IMPRESSION: 1. Small RIGHT parafalcine subdural hematoma, maximum thickness 7 mm, without associated mass effect or midline shift. No evidence of hemorrhage or hematoma elsewhere. 2. No acute intracranial abnormalities otherwise. 3. Mild age-appropriate cortical atrophy and moderate to severe chronic microvascular ischemic changes of the white matter. 4. No facial bone fractures identified. 5. Hematoma in the subcutaneous tissues of the RIGHT cheek and in the RIGHT side of the cheek. 6. No cervical spine fractures identified. 7. Multilevel degenerative disc disease, spondylosis and facet degenerative changes with multilevel foraminal stenoses as detailed above. I telephoned these results at the time of interpretation on 07/24/2018 at 2:18 pm to Dr. Merlyn Lot,  who verbally acknowledged these results. Electronically Signed   By: Evangeline Dakin M.D.   On: 07/24/2018 14:20   Ct Cervical Spine Wo Contrast  Result Date: 07/24/2018 CLINICAL DATA:  84 year old who fell earlier today and has RIGHT facial swelling and neck pain. Laceration to the lip. Patient amnestic to the event. Initial encounter. EXAM: CT HEAD WITHOUT CONTRAST CT MAXILLOFACIAL WITHOUT CONTRAST CT CERVICAL SPINE WITHOUT CONTRAST TECHNIQUE: Multidetector CT imaging of the head, cervical spine, and maxillofacial structures were performed using the standard protocol without intravenous contrast. Multiplanar CT image reconstructions of the cervical spine and maxillofacial structures were also generated. A metallic BB was placed on the right temple in order to reliably differentiate right from left. COMPARISON:  None. FINDINGS: CT HEAD FINDINGS Brain: RIGHT parafalcine subdural hematoma extending upward to near the vertex, maximum thickness approximately 7 mm, without associated mass effect or midline shift. No evidence of parenchymal hemorrhage or hematoma. No evidence of subarachnoid hemorrhage or intraventricular hemorrhage. Ventricular system normal in size and appearance for age. Mild age-appropriate cortical atrophy. Moderate to severe changes of small vessel disease of the white matter diffusely. Vascular: Severe BILATERAL carotid siphon and vertebrobasilar atherosclerosis. No hyperdense vessel. Skull: No skull fracture or other focal osseous abnormality involving the skull. Other: None. CT MAXILLOFACIAL FINDINGS Osseous: No fractures identified involving the facial bones. Temporomandibular joints anatomically aligned without significant degenerative change. Osseous demineralization. Edentulous. Orbits: No orbital fractures. No evidence of orbital hemorrhage. Extraocular muscles normal in appearance throughout. Hyperdensity in both lens, query cataracts. Senile calcification involving the RIGHT globe.  Sinuses: Opacification of a solitary LEFT POSTERIOR ethmoid air cell. Paranasal sinuses otherwise well aerated throughout. BILATERAL middle ear cavities and BILATERAL mastoid air cells well-aerated. Bony nasal septal deviation to the RIGHT. Soft tissues: Hematoma in the subcutaneous tissues of the RIGHT cheek measuring approximately 2.7 x 1.7 cm. Small hematoma involving the RIGHT side of  the cheek in measuring approximately 0.8 x 1.3 cm. CT CERVICAL SPINE FINDINGS Alignment: Anatomic POSTERIOR alignment. Straightening of the usual lordosis. Facet joints anatomically aligned throughout with diffuse degenerative changes. Skull base and vertebrae: No fractures identified involving the cervical spine. Coronal reformatted images demonstrate an intact craniocervical junction, intact dens and intact lateral masses throughout. Benign bone island in the LEFT lateral arch of C1. Soft tissues and spinal canal: No evidence of paraspinous or spinal canal hematoma. No evidence of spinal stenosis. Disc levels: Moderate disc space narrowing at C5-6 and mild disc space narrowing at C4-5. No visible disc protrusions or extrusions on the soft tissue window images. Combination of facet and uncinate hypertrophy account for multilevel foraminal stenoses including mild RIGHT C2-3, mild BILATERAL C3-4, severe BILATERAL C4-5, moderate BILATERAL C5-6. Upper chest: Visualized lung apices clear. Mild atherosclerosis involving the visualized great vessels. Other: Mild BILATERAL cervical carotid atherosclerosis. IMPRESSION: 1. Small RIGHT parafalcine subdural hematoma, maximum thickness 7 mm, without associated mass effect or midline shift. No evidence of hemorrhage or hematoma elsewhere. 2. No acute intracranial abnormalities otherwise. 3. Mild age-appropriate cortical atrophy and moderate to severe chronic microvascular ischemic changes of the white matter. 4. No facial bone fractures identified. 5. Hematoma in the subcutaneous tissues of the  RIGHT cheek and in the RIGHT side of the cheek. 6. No cervical spine fractures identified. 7. Multilevel degenerative disc disease, spondylosis and facet degenerative changes with multilevel foraminal stenoses as detailed above. I telephoned these results at the time of interpretation on 07/24/2018 at 2:18 pm to Dr. Merlyn Lot, who verbally acknowledged these results. Electronically Signed   By: Evangeline Dakin M.D.   On: 07/24/2018 14:20   Mr Jeri Cos OV Contrast  Result Date: 07/24/2018 CLINICAL DATA:  Syncope.  Small acute subdural hematoma. EXAM: MRI HEAD WITHOUT AND WITH CONTRAST TECHNIQUE: Multiplanar, multiecho pulse sequences of the brain and surrounding structures were obtained without and with intravenous contrast. CONTRAST:  9 mL Gadavist COMPARISON:  Head CT 07/24/2018 FINDINGS: Brain: There is a thin subdural hematoma along the length of the falx cerebri. No midline shift or other mass effect. No acute ischemia. There is early confluent hyperintensity within the periventricular white matter, consistent with chronic ischemic microangiopathy. 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 There is mild hyperintense T2-weighted signal within the overlying brain. 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 Vascular: The major intracranial arterial and venous sinus flow voids are preserved. Skull and upper cervical spine: The visualized skull base, calvarium, upper cervical spine and extracranial soft tissues are normal. Sinuses/Orbits: No fluid levels or advanced mucosal thickening of the visualized paranasal sinuses. No mastoid or middle ear effusion. The orbits are normal. Other: None IMPRESSION: 1. Thin parafalcine subdural hematoma, unchanged compared to the earlier CT. 2. 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 Electronically Signed   By: Ulyses Jarred M.D.   On:  07/24/2018 22:40   US Carotid Bilateral  Result Date: 07/25/2018 CLINICAL DATA:  84 year old female with syncope and collapse EXAM: BILATERAL CAROTID DUPLEX ULTRASOUND TECHNIQUE: Pearline Cables scale imaging, color Doppler and duplex ultrasound were performed of bilateral carotid and vertebral arteries in the neck. COMPARISON:  Brain MRI 07/24/2018 FINDINGS: Criteria: Quantification of carotid stenosis is based on velocity parameters that correlate the residual internal carotid diameter with NASCET-based stenosis levels, using the diameter of the distal internal carotid lumen as the denominator for stenosis  measurement. The following velocity measurements were obtained: RIGHT ICA: 85/22 cm/sec CCA: 732/20 cm/sec SYSTOLIC ICA/CCA RATIO:  0.8 ECA:  71 cm/sec LEFT ICA: 93/14 cm/sec CCA: 25/42 cm/sec SYSTOLIC ICA/CCA RATIO:  1.1 ECA:  81 cm/sec RIGHT CAROTID ARTERY: No significant atherosclerotic plaque or evidence of stenosis. RIGHT VERTEBRAL ARTERY:  Patent with normal antegrade flow. LEFT CAROTID ARTERY: No significant atherosclerotic plaque or evidence of stenosis. LEFT VERTEBRAL ARTERY:  Patent with normal antegrade flow. IMPRESSION: Negative bilateral carotid duplex ultrasound. No significant atherosclerotic plaque or evidence of stenosis. Signed, Criselda Peaches, MD, [facility] Radiology Electronically Signed   By: Jacqulynn Cadet M.D.   On: 07/25/2018 08:48   Dg Chest Portable 1 View  Result Date: 07/24/2018 CLINICAL DATA:  Syncope. EXAM: PORTABLE CHEST 1 VIEW COMPARISON:  09/06/2007 FINDINGS: The heart size and mediastinal contours are within normal limits. Both lungs are clear. Chronic degenerative changes of both shoulders. Aortic atherosclerosis. IMPRESSION: 1. No acute cardiopulmonary disease. 2. Aortic atherosclerosis. Electronically Signed   By: Lorriane Shire M.D.   On: 07/24/2018 14:14   Ct Maxillofacial Wo Contrast  Result Date:  07/24/2018 CLINICAL DATA:  84 year old who fell earlier today and has RIGHT facial swelling and neck pain. Laceration to the lip. Patient amnestic to the event. Initial encounter. EXAM: CT HEAD WITHOUT CONTRAST CT MAXILLOFACIAL WITHOUT CONTRAST CT CERVICAL SPINE WITHOUT CONTRAST TECHNIQUE: Multidetector CT imaging of the head, cervical spine, and maxillofacial structures were performed using the standard protocol without intravenous contrast. Multiplanar CT image reconstructions of the cervical spine and maxillofacial structures were also generated. A metallic BB was placed on the right temple in order to reliably differentiate right from left. COMPARISON:  None. FINDINGS: CT HEAD FINDINGS Brain: RIGHT parafalcine subdural hematoma extending upward to near the vertex, maximum thickness approximately 7 mm, without associated mass effect or midline shift. No evidence of parenchymal hemorrhage or hematoma. No evidence of subarachnoid hemorrhage or intraventricular hemorrhage. Ventricular system normal in size and appearance for age. Mild age-appropriate cortical atrophy. Moderate to severe changes of small vessel disease of the white matter diffusely. Vascular: Severe BILATERAL carotid siphon and vertebrobasilar atherosclerosis. No hyperdense vessel. Skull: No skull fracture or other focal osseous abnormality involving the skull. Other: None. CT MAXILLOFACIAL FINDINGS Osseous: No fractures identified involving the facial bones. Temporomandibular joints anatomically aligned without significant degenerative change. Osseous demineralization. Edentulous. Orbits: No orbital fractures. No evidence of orbital hemorrhage. Extraocular muscles normal in appearance throughout. Hyperdensity in both lens, query cataracts. Senile calcification involving the RIGHT globe. Sinuses: Opacification of a solitary LEFT POSTERIOR ethmoid air cell. Paranasal sinuses otherwise well aerated throughout. BILATERAL middle ear cavities and  BILATERAL mastoid air cells well-aerated. Bony nasal septal deviation to the RIGHT. Soft tissues: Hematoma in the subcutaneous tissues of the RIGHT cheek measuring approximately 2.7 x 1.7 cm. Small hematoma involving the RIGHT side of the cheek in measuring approximately 0.8 x 1.3 cm. CT CERVICAL SPINE FINDINGS Alignment: Anatomic POSTERIOR alignment. Straightening of the usual lordosis. Facet joints anatomically aligned throughout with diffuse degenerative changes. Skull base and vertebrae: No fractures identified involving the cervical spine. Coronal reformatted images demonstrate an intact craniocervical junction, intact dens and intact lateral masses throughout. Benign bone island in the LEFT lateral arch of C1. Soft tissues and spinal canal: No evidence of paraspinous or spinal canal hematoma. No evidence of spinal stenosis. Disc levels: Moderate disc space narrowing at C5-6 and mild disc space narrowing at C4-5. No visible disc protrusions  or extrusions on the soft tissue window images. Combination of facet and uncinate hypertrophy account for multilevel foraminal stenoses including mild RIGHT C2-3, mild BILATERAL C3-4, severe BILATERAL C4-5, moderate BILATERAL C5-6. Upper chest: Visualized lung apices clear. Mild atherosclerosis involving the visualized great vessels. Other: Mild BILATERAL cervical carotid atherosclerosis. IMPRESSION: 1. Small RIGHT parafalcine subdural hematoma, maximum thickness 7 mm, without associated mass effect or midline shift. No evidence of hemorrhage or hematoma elsewhere. 2. No acute intracranial abnormalities otherwise. 3. Mild age-appropriate cortical atrophy and moderate to severe chronic microvascular ischemic changes of the white matter. 4. No facial bone fractures identified. 5. Hematoma in the subcutaneous tissues of the RIGHT cheek and in the RIGHT side of the cheek. 6. No cervical spine fractures identified. 7. Multilevel degenerative disc disease, spondylosis and facet  degenerative changes with multilevel foraminal stenoses as detailed above. I telephoned these results at the time of interpretation on 07/24/2018 at 2:18 pm to Dr. Merlyn Lot, who verbally acknowledged these results. Electronically Signed   By: Evangeline Dakin M.D.   On: 07/24/2018 14:20    Assessment and Plan:   Syncope and collapse - Etiology of syncope unknown at this time with differential to include possible arrhythmia or transient complete heart block.  Low suspicion for orthostatic hypotension due to lack of presyncope symptoms prior to loss of consciousness and per her report of adequate hydration. Low suspicion for cardiac ischemic event as below and given lack of CP or SOB. Low suspicion for seizure given lack of aura. Echo as above with G1DD and LVH but no severe valvular disease or structural changes.  - CT brain on arrival showed small R parafalcine subdural hematoma with maximum thickness 22mm and no associated mass effect or midline shift.  Repeat head CT recommended and completed with stable appearance of subdural hematoma at 16mm. She also has a hematoma of the subcutaneous tissues of the R cheek.  - Continue to hold any anticoagulation / antiplatelets in the setting of the CT findings above. BP control. Continue DVT prophylaxis with SCDs - Neurosurgery consulted with recommendation for carotid dopplers, frequent neurologic checks, and AM MRI.   - TSH 1.095. Monitor electrolytes and replete potassium as below. - Recommend outpatient live Zio AT monitor for 1 month as no signs of arrhythmia yet on telemetry with syncope and collapse. Continue to monitor on telemetry for any arrhythmia this admission.  - Patient cannot drive for at least 6 months following syncopal episode. Will need to discuss this with patient during admission and before discharge.  - Recommend further follow-up / consultation with EP given LOC without presyncope symptoms/aura that would imply etiology of syncope  due to orthostatic hypotension, vasovagal, or seizure etiology. If further episodes or evidence of CHB, tachybrady syndrome, or severe arrhythmia, consider evaluation for ICD/PPM.  Hypokalemia - K 3.5. Replete with goal 4.0. Check Mg.  Anemia - Hgb 11.2. Daily CBC given CT findings.  Elevated troponin without chest pain - No chest pain, SOB. Initial high sensitivity troponin 32 (0.032)  40 and now down trending. Suspect supply demand ischemia in the setting of elevated BP and syncope/LOC/ - Echo ordered in the ED as above with EF 60-65%, moderately increased left ventricular wall thickness, impaired left ventricular relaxation, elevated mean left arterial pressure, mild aortic stenosis. RVSP 31.71mmhg - No plan for cardiac catheterization at this time but will discuss with EP as above.   HTN - Suboptimal control. Continue irbesartan with titration as needed and due to consideration of  subdural hematoma. Given elevated HR per EMR recordings, added low dose amlodipine for further BP control and recommend titration as BP allows. Daily BMP to monitor renal function.  HLD - Continue atorvastatin 40mg  daily  DM2 - SSI, metformin, per IM   For questions or updates, please contact [place] Please consult [email or web address] for contact info under     Signed, Arvil Chaco, PA-C  07/25/2018 8:52 AM

## 2018-07-25 NOTE — Evaluation (Signed)
Physical Therapy Evaluation Patient Details Name: Beverly [name] MRN: [id number] DOB: 12/29/1934 Today's Date: 07/25/2018   History of Present Illness  presented to ER secondary to syncopal episode and collapse; admitted for syncope work up, noted with R lower lip lac, R forehead contusion and stable parafalcine SDH (6-65mm).  Mild increase in troponin, flat and attributed to demand ischemia per notes.  Clinical Impression  Upon evaluation, patient alert and oriented; follows commands and demonstrates good effort with all mobility tasks.  Bilat UE/LE strength and ROM grossly symmetrical and WFL; no focal weakness, sensory or coordination deficits noted throughout session.  Denies pain, dizziness or prodromal symptoms with transition to upright; vitals stable and WFL with position change.  Demonstrates ability to complete bed mobility with mod indep; sit/stand, basic transfers and gait (180') with RW, cga/close sup.  Slow and guarded, but no overt buckling or LOB; does voice preference for continued use of RW for optimal safety and energy conservation at this time (does have one available for use at discharge). Would benefit from skilled PT to address above deficits and promote optimal return to PLOF; Recommend transition to [place] upon discharge from acute hospitalization.     Follow Up Recommendations Home health PT    Equipment Recommendations  (has access to RW if needed)    Recommendations for Other Services       Precautions / Restrictions Precautions Precautions: Fall Restrictions Weight Bearing Restrictions: No      Mobility  Bed Mobility Overal bed mobility: Modified Independent                Transfers Overall transfer level: Needs assistance Equipment used: Rolling walker (2 wheeled) Transfers: Sit to/from Stand Sit to Stand: Min guard         General transfer comment: cuing for hand placement; multiple attempts, use of bilat UEs required for lift off from bed  surface  Ambulation/Gait Ambulation/Gait assistance: Min guard Gait Distance (Feet): 180 Feet Assistive device: Rolling walker (2 wheeled)       General Gait Details: reciprocal stepping pattern, mild instability in R LE stance (history of knee problems) but no overt buckling or LOB.  Does prefer use of RW at this time for optimal safety/stability  Stairs            Wheelchair Mobility    Modified Rankin (Stroke Patients Only)       Balance Overall balance assessment: Needs assistance Sitting-balance support: No upper extremity supported;Feet supported Sitting balance-Leahy Scale: Good     Standing balance support: Bilateral upper extremity supported Standing balance-Leahy Scale: Fair                               Pertinent Vitals/Pain Pain Assessment: No/denies pain    Home Living Family/patient expects to be discharged to:: Private residence Living Arrangements: Alone Available Help at Discharge: Family;Available PRN/intermittently Type of Home: House Home Access: Stairs to enter   [employer] of Steps: 3 front (bilat rails); none on side Home Layout: One level Home Equipment: Walker - 2 wheels      Prior Function Level of Independence: Independent         Comments: Indep with ADLs, household and community mobilization without assist device; + driving; denies recent fall history.     Hand Dominance        Extremity/Trunk Assessment   Upper Extremity Assessment Upper Extremity Assessment: Overall WFL for tasks assessed  Lower Extremity Assessment Lower Extremity Assessment: Overall WFL for tasks assessed(grossly 4/5 throughout, no focal weakness, sensory or coordination deficit appreciated)       Communication      Cognition Arousal/Alertness: Awake/alert Behavior During Therapy: WFL for tasks assessed/performed Overall Cognitive Status: Within Functional Limits for tasks assessed                                         General Comments      Exercises Other Exercises Other Exercises: Educated in hand placement, transfer mechanics and overall safety needs; patient voiced understanding.  Does voice preference for RW with all mobility tasks; has access to one at home for use upon discharge.   Assessment/Plan    PT Assessment Patient needs continued PT services  PT Problem List Decreased strength;Decreased range of motion;Decreased activity tolerance;Decreased balance;Decreased mobility;Decreased knowledge of use of DME;Decreased safety awareness;Decreased knowledge of precautions       PT Treatment Interventions DME instruction;Gait training;Stair training;Functional mobility training;Therapeutic activities;Therapeutic exercise;Balance training;Cognitive remediation;Patient/family education    PT Goals (Current goals can be found in the Care Plan section)  Acute Rehab PT Goals Patient Stated Goal: to return home PT Goal Formulation: With patient Time For Goal Achievement: 08/08/18 Potential to Achieve Goals: Good    Frequency Min 2X/week   Barriers to discharge        Co-evaluation               AM-PAC PT "6 Clicks" Mobility  Outcome Measure Help needed turning from your back to your side while in a flat bed without using bedrails?: None Help needed moving from lying on your back to sitting on the side of a flat bed without using bedrails?: None Help needed moving to and from a bed to a chair (including a wheelchair)?: A Little Help needed standing up from a chair using your arms (e.g., wheelchair or bedside chair)?: A Little Help needed to walk in hospital room?: A Little Help needed climbing 3-5 steps with a railing? : A Little 6 Click Score: 20    End of Session Equipment Utilized During Treatment: Gait belt Activity Tolerance: Patient tolerated treatment well Patient left: in chair;with call bell/phone within reach;with chair alarm set Nurse  Communication: Mobility status PT Visit Diagnosis: Difficulty in walking, not elsewhere classified (R26.2)    Time: 1205-1227 PT Time Calculation (min) (ACUTE ONLY): 22 min   Charges:   PT Evaluation $PT Eval Moderate Complexity: 1 Mod PT Treatments $Therapeutic Activity: 8-22 mins        Audrinna Sherman H. Owens Shark, PT, DPT, NCS 07/25/18, 1:54 PM [phone]

## 2018-07-25 NOTE — Progress Notes (Signed)
*  PRELIMINARY RESULTS* Echocardiogram 2D Echocardiogram has been performed.  Beverly [name] 07/25/2018, 10:03 AM

## 2018-07-25 NOTE — Progress Notes (Signed)
[facility] [place] NAME: Beverly [name]    MR#:  [id number]  DATE OF BIRTH:  1934-07-03  SUBJECTIVE:   Patient seen earlier this morning.  No acute events overnight.  REVIEW OF SYSTEMS:    Review of Systems  Constitutional: Negative for fever, chills weight loss HENT: Negative for ear pain, nosebleeds, congestion, facial swelling, rhinorrhea, neck pain, neck stiffness and ear discharge.   Respiratory: Negative for cough, shortness of breath, wheezing  Cardiovascular: Negative for chest pain, palpitations and leg swelling.  Gastrointestinal: Negative for heartburn, abdominal pain, vomiting, diarrhea or consitpation Genitourinary: Negative for dysuria, urgency, frequency, hematuria Musculoskeletal: Negative for back pain or joint pain Neurological: Negative for dizziness, seizures, syncope, focal weakness,  numbness and headaches.  Hematological: Does not bruise/bleed easily.  Psychiatric/Behavioral: Negative for hallucinations, confusion, dysphoric mood    Tolerating Diet:yes      DRUG ALLERGIES:   Allergies  Allergen Reactions  . Lisinopril     VITALS:  Blood pressure (!) 160/87, pulse 92, temperature 98.7 F (37.1 C), temperature source Oral, resp. rate 16, height 5\' 5"  (1.651 m), weight 90.7 kg, SpO2 100 %.  PHYSICAL EXAMINATION:  Constitutional: Appears well-developed and well-nourished. No distress. HENT: Normocephalic. Marland Kitchen Oropharynx is clear and moist.  Eyes: Conjunctivae and EOM are normal. PERRLA, no scleral icterus.  Neck: Normal ROM. Neck supple. No JVD. No tracheal deviation. CVS: RRR, S1/S2 +, no murmurs, no gallops, no carotid bruit.  Pulmonary: Effort and breath sounds normal, no stridor, rhonchi, wheezes, rales.  Abdominal: Soft. BS +,  no distension, tenderness, rebound or guarding.  Musculoskeletal: Normal range of motion. No edema and no tenderness.  Neuro: Alert. CN 2-12 grossly intact. No focal  deficits. Skin: Right lip with swelling and ecchymosis noted around right eye with small laceration  psychiatric: Normal mood and affect.      LABORATORY PANEL:   CBC Recent Labs  Lab 07/25/18 0437  WBC 7.9  HGB 11.2*  HCT 34.7*  PLT 189   ------------------------------------------------------------------------------------------------------------------  Chemistries  Recent Labs  Lab 07/24/18 1326 07/25/18 0437  NA 141 141  K 4.2 3.5  CL 106 107  CO2 21* 26  GLUCOSE 142* 107*  BUN 17 10  CREATININE 0.84 0.63  CALCIUM 9.1 8.6*  AST 25  --   ALT 12  --   ALKPHOS 47  --   BILITOT 0.7  --    ------------------------------------------------------------------------------------------------------------------  Cardiac Enzymes No results for input(s): TROPONINI in the last 168 hours. ------------------------------------------------------------------------------------------------------------------  RADIOLOGY:  Ct Head Wo Contrast  Result Date: 07/24/2018 CLINICAL DATA:  Known subdural hematoma, follow-up, fell in parking lot EXAM: CT HEAD WITHOUT CONTRAST TECHNIQUE: Contiguous axial images were obtained from the base of the skull through the vertex without intravenous contrast. Sagittal and coronal MPR images reconstructed from axial data set. COMPARISON:  Study at 1847 hours compared to 1337 hours FINDINGS: Brain: Mild atrophy. Normal ventricular morphology. No midline shift or mass effect. Small vessel chronic ischemic changes of deep cerebral white matter. Again identified parafalcine subdural hematoma up to 6 mm thick, previously 7 mm. No new areas of hemorrhage or infarction. No mass lesion. Vascular: Atherosclerotic calcification of internal carotid arteries bilaterally at skull base Skull: Intact Sinuses/Orbits: Clear Other: N/A IMPRESSION: Stable appearance of a small parafalcine subdural hematoma measuring 6 mm thick. No midline shift. Underlying atrophy and small vessel  chronic ischemic changes of deep cerebral white matter. No new intracranial abnormalities. Electronically Signed  By: Lavonia Dana M.D.   On: 07/24/2018 19:10   Ct Head Wo Contrast  Result Date: 07/24/2018 CLINICAL DATA:  84 year old who fell earlier today and has RIGHT facial swelling and neck pain. Laceration to the lip. Patient amnestic to the event. Initial encounter. EXAM: CT HEAD WITHOUT CONTRAST CT MAXILLOFACIAL WITHOUT CONTRAST CT CERVICAL SPINE WITHOUT CONTRAST TECHNIQUE: Multidetector CT imaging of the head, cervical spine, and maxillofacial structures were performed using the standard protocol without intravenous contrast. Multiplanar CT image reconstructions of the cervical spine and maxillofacial structures were also generated. A metallic BB was placed on the right temple in order to reliably differentiate right from left. COMPARISON:  None. FINDINGS: CT HEAD FINDINGS Brain: RIGHT parafalcine subdural hematoma extending upward to near the vertex, maximum thickness approximately 7 mm, without associated mass effect or midline shift. No evidence of parenchymal hemorrhage or hematoma. No evidence of subarachnoid hemorrhage or intraventricular hemorrhage. Ventricular system normal in size and appearance for age. Mild age-appropriate cortical atrophy. Moderate to severe changes of small vessel disease of the white matter diffusely. Vascular: Severe BILATERAL carotid siphon and vertebrobasilar atherosclerosis. No hyperdense vessel. Skull: No skull fracture or other focal osseous abnormality involving the skull. Other: None. CT MAXILLOFACIAL FINDINGS Osseous: No fractures identified involving the facial bones. Temporomandibular joints anatomically aligned without significant degenerative change. Osseous demineralization. Edentulous. Orbits: No orbital fractures. No evidence of orbital hemorrhage. Extraocular muscles normal in appearance throughout. Hyperdensity in both lens, query cataracts. Senile  calcification involving the RIGHT globe. Sinuses: Opacification of a solitary LEFT POSTERIOR ethmoid air cell. Paranasal sinuses otherwise well aerated throughout. BILATERAL middle ear cavities and BILATERAL mastoid air cells well-aerated. Bony nasal septal deviation to the RIGHT. Soft tissues: Hematoma in the subcutaneous tissues of the RIGHT cheek measuring approximately 2.7 x 1.7 cm. Small hematoma involving the RIGHT side of the cheek in measuring approximately 0.8 x 1.3 cm. CT CERVICAL SPINE FINDINGS Alignment: Anatomic POSTERIOR alignment. Straightening of the usual lordosis. Facet joints anatomically aligned throughout with diffuse degenerative changes. Skull base and vertebrae: No fractures identified involving the cervical spine. Coronal reformatted images demonstrate an intact craniocervical junction, intact dens and intact lateral masses throughout. Benign bone island in the LEFT lateral arch of C1. Soft tissues and spinal canal: No evidence of paraspinous or spinal canal hematoma. No evidence of spinal stenosis. Disc levels: Moderate disc space narrowing at C5-6 and mild disc space narrowing at C4-5. No visible disc protrusions or extrusions on the soft tissue window images. Combination of facet and uncinate hypertrophy account for multilevel foraminal stenoses including mild RIGHT C2-3, mild BILATERAL C3-4, severe BILATERAL C4-5, moderate BILATERAL C5-6. Upper chest: Visualized lung apices clear. Mild atherosclerosis involving the visualized great vessels. Other: Mild BILATERAL cervical carotid atherosclerosis. IMPRESSION: 1. Small RIGHT parafalcine subdural hematoma, maximum thickness 7 mm, without associated mass effect or midline shift. No evidence of hemorrhage or hematoma elsewhere. 2. No acute intracranial abnormalities otherwise. 3. Mild age-appropriate cortical atrophy and moderate to severe chronic microvascular ischemic changes of the white matter. 4. No facial bone fractures identified. 5.  Hematoma in the subcutaneous tissues of the RIGHT cheek and in the RIGHT side of the cheek. 6. No cervical spine fractures identified. 7. Multilevel degenerative disc disease, spondylosis and facet degenerative changes with multilevel foraminal stenoses as detailed above. I telephoned these results at the time of interpretation on 07/24/2018 at 2:18 pm to Dr. Merlyn Lot, who verbally acknowledged these results. Electronically Signed   By: Sherran Needs.D.  On: 07/24/2018 14:20   Ct Cervical Spine Wo Contrast  Result Date: 07/24/2018 CLINICAL DATA:  84 year old who fell earlier today and has RIGHT facial swelling and neck pain. Laceration to the lip. Patient amnestic to the event. Initial encounter. EXAM: CT HEAD WITHOUT CONTRAST CT MAXILLOFACIAL WITHOUT CONTRAST CT CERVICAL SPINE WITHOUT CONTRAST TECHNIQUE: Multidetector CT imaging of the head, cervical spine, and maxillofacial structures were performed using the standard protocol without intravenous contrast. Multiplanar CT image reconstructions of the cervical spine and maxillofacial structures were also generated. A metallic BB was placed on the right temple in order to reliably differentiate right from left. COMPARISON:  None. FINDINGS: CT HEAD FINDINGS Brain: RIGHT parafalcine subdural hematoma extending upward to near the vertex, maximum thickness approximately 7 mm, without associated mass effect or midline shift. No evidence of parenchymal hemorrhage or hematoma. No evidence of subarachnoid hemorrhage or intraventricular hemorrhage. Ventricular system normal in size and appearance for age. Mild age-appropriate cortical atrophy. Moderate to severe changes of small vessel disease of the white matter diffusely. Vascular: Severe BILATERAL carotid siphon and vertebrobasilar atherosclerosis. No hyperdense vessel. Skull: No skull fracture or other focal osseous abnormality involving the skull. Other: None. CT MAXILLOFACIAL FINDINGS Osseous: No  fractures identified involving the facial bones. Temporomandibular joints anatomically aligned without significant degenerative change. Osseous demineralization. Edentulous. Orbits: No orbital fractures. No evidence of orbital hemorrhage. Extraocular muscles normal in appearance throughout. Hyperdensity in both lens, query cataracts. Senile calcification involving the RIGHT globe. Sinuses: Opacification of a solitary LEFT POSTERIOR ethmoid air cell. Paranasal sinuses otherwise well aerated throughout. BILATERAL middle ear cavities and BILATERAL mastoid air cells well-aerated. Bony nasal septal deviation to the RIGHT. Soft tissues: Hematoma in the subcutaneous tissues of the RIGHT cheek measuring approximately 2.7 x 1.7 cm. Small hematoma involving the RIGHT side of the cheek in measuring approximately 0.8 x 1.3 cm. CT CERVICAL SPINE FINDINGS Alignment: Anatomic POSTERIOR alignment. Straightening of the usual lordosis. Facet joints anatomically aligned throughout with diffuse degenerative changes. Skull base and vertebrae: No fractures identified involving the cervical spine. Coronal reformatted images demonstrate an intact craniocervical junction, intact dens and intact lateral masses throughout. Benign bone island in the LEFT lateral arch of C1. Soft tissues and spinal canal: No evidence of paraspinous or spinal canal hematoma. No evidence of spinal stenosis. Disc levels: Moderate disc space narrowing at C5-6 and mild disc space narrowing at C4-5. No visible disc protrusions or extrusions on the soft tissue window images. Combination of facet and uncinate hypertrophy account for multilevel foraminal stenoses including mild RIGHT C2-3, mild BILATERAL C3-4, severe BILATERAL C4-5, moderate BILATERAL C5-6. Upper chest: Visualized lung apices clear. Mild atherosclerosis involving the visualized great vessels. Other: Mild BILATERAL cervical carotid atherosclerosis. IMPRESSION: 1. Small RIGHT parafalcine subdural  hematoma, maximum thickness 7 mm, without associated mass effect or midline shift. No evidence of hemorrhage or hematoma elsewhere. 2. No acute intracranial abnormalities otherwise. 3. Mild age-appropriate cortical atrophy and moderate to severe chronic microvascular ischemic changes of the white matter. 4. No facial bone fractures identified. 5. Hematoma in the subcutaneous tissues of the RIGHT cheek and in the RIGHT side of the cheek. 6. No cervical spine fractures identified. 7. Multilevel degenerative disc disease, spondylosis and facet degenerative changes with multilevel foraminal stenoses as detailed above. I telephoned these results at the time of interpretation on 07/24/2018 at 2:18 pm to Dr. Merlyn Lot, who verbally acknowledged these results. Electronically Signed   By: Evangeline Dakin M.D.   On: 07/24/2018 14:20  Mr Jeri Cos Wo Contrast  Result Date: 07/24/2018 CLINICAL DATA:  Syncope.  Small acute subdural hematoma. EXAM: MRI HEAD WITHOUT AND WITH CONTRAST TECHNIQUE: Multiplanar, multiecho pulse sequences of the brain and surrounding structures were obtained without and with intravenous contrast. CONTRAST:  9 mL Gadavist COMPARISON:  Head CT 07/24/2018 FINDINGS: Brain: There is a thin subdural hematoma along the length of the falx cerebri. No midline shift or other mass effect. No acute ischemia. There is early confluent hyperintensity within the periventricular white matter, consistent with chronic ischemic microangiopathy. 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 There is mild hyperintense T2-weighted signal within the overlying brain. 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 Vascular: The major intracranial arterial and venous sinus flow voids are preserved. Skull and upper cervical spine: The visualized skull base, calvarium, upper cervical spine and extracranial soft tissues  are normal. Sinuses/Orbits: No fluid levels or advanced mucosal thickening of the visualized paranasal sinuses. No mastoid or middle ear effusion. The orbits are normal. Other: None IMPRESSION: 1. Thin parafalcine subdural hematoma, unchanged compared to the earlier CT. 2. 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 Electronically Signed   By: Ulyses Jarred M.D.   On: 07/24/2018 22:40   US Carotid Bilateral  Result Date: 07/25/2018 CLINICAL DATA:  84 year old female with syncope and collapse EXAM: BILATERAL CAROTID DUPLEX ULTRASOUND TECHNIQUE: Pearline Cables scale imaging, color Doppler and duplex ultrasound were performed of bilateral carotid and vertebral arteries in the neck. COMPARISON:  Brain MRI 07/24/2018 FINDINGS: Criteria: Quantification of carotid stenosis is based on velocity parameters that correlate the residual internal carotid diameter with NASCET-based stenosis levels, using the diameter of the distal internal carotid lumen as the denominator for stenosis measurement. The following velocity measurements were obtained: RIGHT ICA: 85/22 cm/sec CCA: 272/53 cm/sec SYSTOLIC ICA/CCA RATIO:  0.8 ECA:  71 cm/sec LEFT ICA: 93/14 cm/sec CCA: 66/44 cm/sec SYSTOLIC ICA/CCA RATIO:  1.1 ECA:  81 cm/sec RIGHT CAROTID ARTERY: No significant atherosclerotic plaque or evidence of stenosis. RIGHT VERTEBRAL ARTERY:  Patent with normal antegrade flow. LEFT CAROTID ARTERY: No significant atherosclerotic plaque or evidence of stenosis. LEFT VERTEBRAL ARTERY:  Patent with normal antegrade flow. IMPRESSION: Negative bilateral carotid duplex ultrasound. No significant atherosclerotic plaque or evidence of stenosis. Signed, Criselda Peaches, MD, [facility] Electronically Signed   By: Jacqulynn Cadet M.D.   On: 07/25/2018 08:48   Dg Chest Portable 1 View  Result Date: 07/24/2018 CLINICAL DATA:  Syncope. EXAM: PORTABLE CHEST 1 VIEW COMPARISON:   09/06/2007 FINDINGS: The heart size and mediastinal contours are within normal limits. Both lungs are clear. Chronic degenerative changes of both shoulders. Aortic atherosclerosis. IMPRESSION: 1. No acute cardiopulmonary disease. 2. Aortic atherosclerosis. Electronically Signed   By: Lorriane Shire M.D.   On: 07/24/2018 14:14   Ct Maxillofacial Wo Contrast  Result Date: 07/24/2018 CLINICAL DATA:  84 year old who fell earlier today and has RIGHT facial swelling and neck pain. Laceration to the lip. Patient amnestic to the event. Initial encounter. EXAM: CT HEAD WITHOUT CONTRAST CT MAXILLOFACIAL WITHOUT CONTRAST CT CERVICAL SPINE WITHOUT CONTRAST TECHNIQUE: Multidetector CT imaging of the head, cervical spine, and maxillofacial structures were performed using the standard protocol without intravenous contrast. Multiplanar CT image reconstructions of the cervical spine and maxillofacial structures were also generated. A metallic BB was placed on the right temple in order to reliably differentiate  right from left. COMPARISON:  None. FINDINGS: CT HEAD FINDINGS Brain: RIGHT parafalcine subdural hematoma extending upward to near the vertex, maximum thickness approximately 7 mm, without associated mass effect or midline shift. No evidence of parenchymal hemorrhage or hematoma. No evidence of subarachnoid hemorrhage or intraventricular hemorrhage. Ventricular system normal in size and appearance for age. Mild age-appropriate cortical atrophy. Moderate to severe changes of small vessel disease of the white matter diffusely. Vascular: Severe BILATERAL carotid siphon and vertebrobasilar atherosclerosis. No hyperdense vessel. Skull: No skull fracture or other focal osseous abnormality involving the skull. Other: None. CT MAXILLOFACIAL FINDINGS Osseous: No fractures identified involving the facial bones. Temporomandibular joints anatomically aligned without significant degenerative change. Osseous demineralization.  Edentulous. Orbits: No orbital fractures. No evidence of orbital hemorrhage. Extraocular muscles normal in appearance throughout. Hyperdensity in both lens, query cataracts. Senile calcification involving the RIGHT globe. Sinuses: Opacification of a solitary LEFT POSTERIOR ethmoid air cell. Paranasal sinuses otherwise well aerated throughout. BILATERAL middle ear cavities and BILATERAL mastoid air cells well-aerated. Bony nasal septal deviation to the RIGHT. Soft tissues: Hematoma in the subcutaneous tissues of the RIGHT cheek measuring approximately 2.7 x 1.7 cm. Small hematoma involving the RIGHT side of the cheek in measuring approximately 0.8 x 1.3 cm. CT CERVICAL SPINE FINDINGS Alignment: Anatomic POSTERIOR alignment. Straightening of the usual lordosis. Facet joints anatomically aligned throughout with diffuse degenerative changes. Skull base and vertebrae: No fractures identified involving the cervical spine. Coronal reformatted images demonstrate an intact craniocervical junction, intact dens and intact lateral masses throughout. Benign bone island in the LEFT lateral arch of C1. Soft tissues and spinal canal: No evidence of paraspinous or spinal canal hematoma. No evidence of spinal stenosis. Disc levels: Moderate disc space narrowing at C5-6 and mild disc space narrowing at C4-5. No visible disc protrusions or extrusions on the soft tissue window images. Combination of facet and uncinate hypertrophy account for multilevel foraminal stenoses including mild RIGHT C2-3, mild BILATERAL C3-4, severe BILATERAL C4-5, moderate BILATERAL C5-6. Upper chest: Visualized lung apices clear. Mild atherosclerosis involving the visualized great vessels. Other: Mild BILATERAL cervical carotid atherosclerosis. IMPRESSION: 1. Small RIGHT parafalcine subdural hematoma, maximum thickness 7 mm, without associated mass effect or midline shift. No evidence of hemorrhage or hematoma elsewhere. 2. No acute intracranial abnormalities  otherwise. 3. Mild age-appropriate cortical atrophy and moderate to severe chronic microvascular ischemic changes of the white matter. 4. No facial bone fractures identified. 5. Hematoma in the subcutaneous tissues of the RIGHT cheek and in the RIGHT side of the cheek. 6. No cervical spine fractures identified. 7. Multilevel degenerative disc disease, spondylosis and facet degenerative changes with multilevel foraminal stenoses as detailed above. I telephoned these results at the time of interpretation on 07/24/2018 at 2:18 pm to Dr. Merlyn Lot, who verbally acknowledged these results. Electronically Signed   By: Evangeline Dakin M.D.   On: 07/24/2018 14:20     ASSESSMENT AND PLAN:     84 year old female with history of diabetes who presented to the emergency room after a syncopal event.   1.  Syncope with collapse: Initial CT showing subdural hematoma from fall.  Repeat CT and MRI shows stable subdural hematoma.  Patient evaluated neurosurgery.  Patient will need outpatient follow-up for her meningioma seen on CT scan.  Patient will be off blood thinners for now until further follow-up with neurosurgery. Continue work-up for syncope including echocardiogram. Carotid Doppler showed no evidence of hemodynamic significant stenosis.  2.  Subdural hematoma: Continue to hold blood thinners.  Outpatient follow-up with neurosurgery.  3.  Mildly elevated troponin: These are flat and not associated with acute coronary syndrome.  This is demand ischemia from her fall. Follow-up on echocardiogram No events noted on telemetry Cardiology consultation pending 4.  Diabetes: Continue ADA diet, sliding scale and metformin  5.  Essential hypertension: Continue Norvasc and Avapro    Management plans discussed with the patient and she is in agreement.  CODE STATUS: Full  TOTAL TIME TAKING CARE OF THIS PATIENT: 30 minutes.     POSSIBLE D/C tomorrow, DEPENDING ON CLINICAL CONDITION.   Bettey Costa  M.D on 07/25/2018 at 10:53 AM  Between 7am to 6pm - Pager - [phone] After 6pm go to [email or web address] - password [email or web address] [facility]  Office  [phone]  CC: Primary care physician; Steele Sizer, MD  Note: This dictation was prepared with Dragon dictation along with smaller phrase technology. Any transcriptional errors that result from this process are unintentional.

## 2018-07-25 NOTE — TOC Initial Note (Signed)
Transition of Care [facility]) - Initial/Assessment Note    Patient Details  Name: Beverly [name] MRN: [id number] Date of Birth: 01/15/1934  Transition of Care [facility]) CM/SW Contact:    Chenoah Mcnally, Lenice Llamas Phone Number: [phone]  07/25/2018, 2:59 PM  Clinical Narrative: PT is recommending home health. Clinical Social Worker (CSW) met with patient to discuss D/C plan. Patient was alert and oriented X3 and was sitting up in the chair at bedside. CSW introduced self and explained role of CSW department. Per patient she lives alone in [place] and her daughter Earlie Server and granddaughter Geraldo Pitter check on her often. Patient is agreeable to home health and reported that she has a walker at home. CSW provided home health list to patient. Patient reported that she does not have a preference of home health. Per Lauretta Chester home health representative they can accept patient. Patient is agreeable to [facility] [place]. CSW will continue to follow and assist as needed.                 Expected Discharge Plan: [place] Barriers to Discharge: Continued Medical Work up   Patient Goals and CMS Choice Patient states their goals for this hospitalization and ongoing recovery are:: To feel better. CMS Medicare.gov Compare Post Acute Care list provided to:: Patient Choice offered to / list presented to : Patient  Expected Discharge Plan and Services Expected Discharge Plan: [place] In-house Referral: Clinical Social Work   Post Acute Care Choice: [place] arrangements for the past 2 months: [place]: PT [place]: Well [place] Date [facility] Agency Contacted: 07/25/18   Representative spoke with at [place]: [place] Arrangements/Services Living arrangements for the past 2 months: [place] with:: Self Patient language and need for interpreter reviewed:: No Do you feel safe  going back to the place where you live?: Yes      Need for Family Participation in Patient Care: No (Comment) Care giver support system in place?: No (comment)   Criminal Activity/Legal Involvement Pertinent to Current Situation/Hospitalization: No - Comment as needed  Activities of Daily Living Home Assistive Devices/Equipment: None ADL Screening (condition at time of admission) Patient's cognitive ability adequate to safely complete daily activities?: Yes Is the patient deaf or have difficulty hearing?: No Does the patient have difficulty seeing, even when wearing glasses/contacts?: No Does the patient have difficulty concentrating, remembering, or making decisions?: No Patient able to express need for assistance with ADLs?: Yes Does the patient have difficulty dressing or bathing?: No Independently performs ADLs?: Yes (appropriate for developmental age) Does the patient have difficulty walking or climbing stairs?: No Weakness of Legs: None Weakness of Arms/Hands: None  Permission Sought/Granted Permission sought to share information with : [facility] Health agency) Permission granted to share information with : Yes, Verbal Permission Granted              Emotional Assessment Appearance:: Appears stated age   Affect (typically observed): Calm, Pleasant Orientation: : Oriented to Self, Oriented to Place, Oriented to  Time, Oriented to Situation Alcohol / Substance Use: Not Applicable Psych Involvement: No (comment)  Admission diagnosis:  Syncope and collapse [R55] Lip laceration, initial encounter [S01.511A] Injury of head, initial encounter [S09.90XA] Patient Active Problem List   Diagnosis Date Noted  .  Syncope and collapse 07/24/2018  . 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 Type 2 diabetes mellitus with pressure callus ([place]) 10/24/2017  . Diabetic retinopathy ([place]) 11/12/2015   . Obesity (BMI 30.0-34.9) 06/02/2015  . Osteoarthritis 06/02/2015  . Osteopenia 06/02/2015  . Narrowing of intervertebral disc space 06/02/2015  . Dyslipidemia associated with type 2 diabetes mellitus ([place]) 06/02/2015  . Perennial allergic rhinitis 02/28/2007  . Benign essential HTN 06/26/2006  . Pure hypercholesterolemia 06/26/2006   PCP:  Steele Sizer, MD Pharmacy:   [id number] [place] [id number]: [phone]: [phone]    Social Determinants of Health (SDOH) Interventions    Readmission Risk Interventions No flowsheet data found.

## 2018-07-25 NOTE — Progress Notes (Signed)
Advanced care plan. Purpose of the Encounter: CODE STATUS Parties in Attendance: Patient Patient's Decision Capacity:Good Subjective/Patient's story: Beverly [name]  is a 84 y.o. female with a known history of diabetes mellitus, hypertension, hyperlipidemia, and arthritis.  She presented to the emergency room via EMS services after an upset of syncope and collapse.  She was found down in the parking lot of [facility] by her granddaughter.  Patient reports she has been feeling well with no recent illness.  She denies fevers or chills.  No nausea, vomiting, diarrhea, or abdominal pain.  She denies chest pain or shortness of breath.  She denies dizziness or unilateral weakness.  She denies vision disturbance or diplopia.  She denies prior syncopal episodes.  She denies history of MI or CVA.  She has a small superficial laceration to her right lower lip as well as a small right forehead contusion Objective/Medical story Patient was worked up with CT head which showed subdural hematoma. Needs neurosurgery evaluation. Needs work up for syncope. Goals of care determination:  Advance care directives and goals of care discussed with patient. Patient wants everything done which includes cpr , intubation and ventilator if need arises. CODE STATUS: Full code Time spent discussing advanced care planning: 16 minutes

## 2018-07-26 LAB — GLUCOSE, CAPILLARY
Glucose-Capillary: 108 mg/dL — ABNORMAL HIGH (ref 70–99)
Glucose-Capillary: 127 mg/dL — ABNORMAL HIGH (ref 70–99)

## 2018-07-26 MED ORDER — MAGNESIUM SULFATE 2 GM/50ML IV SOLN
2.0000 g | Freq: Once | INTRAVENOUS | Status: AC
  Administered 2018-07-26: 12:00:00 2 g via INTRAVENOUS
  Filled 2018-07-26: qty 50

## 2018-07-26 NOTE — Plan of Care (Signed)

## 2018-07-26 NOTE — Discharge Summary (Signed)
[facility] NAME: Beverly [name]    MR#:  [id number]  DATE OF BIRTH:  1934-07-25  DATE OF ADMISSION:  07/24/2018 ADMITTING PHYSICIAN: Christel Mormon, MD  DATE OF DISCHARGE: 07/26/2018  PRIMARY CARE PHYSICIAN: Steele Sizer, MD    ADMISSION DIAGNOSIS:  Syncope and collapse [R55] Lip laceration, initial encounter [S01.511A] Injury of head, initial encounter [S09.90XA]  DISCHARGE DIAGNOSIS:  Active Problems:   Syncope and collapse   Subdural hematoma ([place])   SECONDARY DIAGNOSIS:   Past Medical History:  Diagnosis Date  . Allergy   . Arthritis   . Diabetes mellitus without complication ([place])   . Hyperlipidemia   . Hypertension     HOSPITAL COURSE:   84 year old female with history of diabetes who presented to the emergency room after a syncopal event.   1.  Syncope with collapse: Initial CT showing subdural hematoma from fall.  Repeat CT and MRI shows stable subdural hematoma.  Patient was evaluated by neurosurgery and cardiology.  Patient will need outpatient follow-up for her meningioma seen on CT scan.  Patient will be off blood thinners for now until further follow-up with neurosurgery. EKG essentially normal.  Echocardiogram is also unremarkable.  Carotid Doppler showed no evidence of hemodynamic significant stenosis. Recommendations are for ZIO monitor which will be arranged through the cardiology office.  She is advised that in [place] she should refrain from driving for probably 6 weeks at least given the lack of prodrome and the lack of explanation 2.  Subdural hematoma: She will need to discontinue aspirin and any NSAIDs which was discussed with the patient.  She will have outpatient neurosurgery follow-up for repeat CT scan.     3.  Mildly elevated troponin: These are flat and not associated with acute coronary syndrome.  This is demand ischemia from her fall. No events noted on telemetry Cardiology  consultation pending 4.  Diabetes: Continue ADA diet and metformin  5.  Essential hypertension: Continue Norvasc and Avapro   DISCHARGE CONDITIONS AND DIET:   Stable for discharge Diabetic diet  CONSULTS OBTAINED:  Treatment Team:  Nelva Bush, MD  DRUG ALLERGIES:   Allergies  Allergen Reactions  . Lisinopril     DISCHARGE MEDICATIONS:   Allergies as of 07/26/2018      Reactions   Lisinopril       Medication List    STOP taking these medications   aspirin 81 MG tablet     TAKE these medications   alendronate 35 MG tablet Commonly known as: FOSAMAX TAKE 1 TABLET BY MOUTH WEEKLY WITH A FULL GLASS OF WATER ON AN EMPTY STOMACH.   atorvastatin 40 MG tablet Commonly known as: LIPITOR Take 1 tablet (40 mg total) by mouth daily.   ferrous sulfate 325 (65 FE) MG tablet Take 325 mg by mouth 2 (two) times daily with a meal.   furosemide 40 MG tablet Commonly known as: Lasix Take 1 tablet (40 mg total) by mouth daily as needed.   Glucosamine Sulfate 500 MG Tabs Take 1 tablet by mouth once a week.   loratadine 10 MG tablet Commonly known as: CLARITIN Take 1 tablet (10 mg total) by mouth daily.   metFORMIN 1000 MG tablet Commonly known as: GLUCOPHAGE Take 1 tablet (1,000 mg total) by mouth 2 (two) times daily with a meal.   montelukast 10 MG tablet Commonly known as: SINGULAIR Take 1 tablet (10 mg total) by mouth at bedtime.   Multi-Vitamins  Tabs Take by mouth.   Potassium Chloride ER 20 MEQ Tbcr Take 10 mEq by mouth daily as needed. With lasix   repaglinide 2 MG tablet Commonly known as: PRANDIN Take 1 tablet (2 mg total) by mouth 3 (three) times daily.   telmisartan 40 MG tablet Commonly known as: Micardis Take 1 tablet (40 mg total) by mouth daily.   vitamin C 500 MG tablet Commonly known as: ASCORBIC ACID Take 500 mg by mouth daily.   Vitamin D 50 MCG (2000 UT) Caps Take 1 capsule (2,000 Units total) by mouth daily.   Zinc Acetate 50 MG  Caps Take by mouth daily.         Today   CHIEF COMPLAINT:  Doing ok this am no chest pain, shortness of breath, dizziness or lightheadedness.   VITAL SIGNS:  Blood pressure (!) 144/71, pulse 83, temperature 99.1 F (37.3 C), temperature source Oral, resp. rate 17, height 5\' 5"  (1.651 m), weight 94.6 kg, SpO2 98 %.   REVIEW OF SYSTEMS:  Review of Systems  Constitutional: Negative.  Negative for chills, fever and malaise/fatigue.  HENT: Negative.  Negative for ear discharge, ear pain, hearing loss, nosebleeds and sore throat.   Eyes: Negative.  Negative for blurred vision and pain.  Respiratory: Negative.  Negative for cough, hemoptysis, shortness of breath and wheezing.   Cardiovascular: Negative.  Negative for chest pain, palpitations and leg swelling.  Gastrointestinal: Negative.  Negative for abdominal pain, blood in stool, diarrhea, nausea and vomiting.  Genitourinary: Negative.  Negative for dysuria.  Musculoskeletal: Negative.  Negative for back pain.  Skin:       Ecchymosis around eye  Neurological: Negative for dizziness, tremors, speech change, focal weakness, seizures and headaches.  Endo/Heme/Allergies: Negative.  Does not bruise/bleed easily.  Psychiatric/Behavioral: Negative.  Negative for depression, hallucinations and suicidal ideas.     PHYSICAL EXAMINATION:  GENERAL:  84 y.o.-year-old patient lying in the bed with no acute distress.  NECK:  Supple, no jugular venous distention. No thyroid enlargement, no tenderness.  LUNGS: Normal breath sounds bilaterally, no wheezing, rales,rhonchi  No use of accessory muscles of respiration.  CARDIOVASCULAR: S1, S2 normal. No murmurs, rubs, or gallops.  ABDOMEN: Soft, non-tender, non-distended. Bowel sounds present. No organomegaly or mass.  EXTREMITIES: No pedal edema, cyanosis, or clubbing.  PSYCHIATRIC: The patient is alert and oriented x 3.  SKIN: Some mild ecchymosis around eye and swelling has improved around  lip  DATA REVIEW:   CBC Recent Labs  Lab 07/25/18 0437  WBC 7.9  HGB 11.2*  HCT 34.7*  PLT 189    Chemistries  Recent Labs  Lab 07/24/18 1326 07/25/18 0437 07/25/18 1314  NA 141 141  --   K 4.2 3.5  --   CL 106 107  --   CO2 21* 26  --   GLUCOSE 142* 107*  --   BUN 17 10  --   CREATININE 0.84 0.63  --   CALCIUM 9.1 8.6*  --   MG  --   --  1.5*  AST 25  --   --   ALT 12  --   --   ALKPHOS 47  --   --   BILITOT 0.7  --   --     Cardiac Enzymes No results for input(s): TROPONINI in the last 168 hours.  Microbiology Results  @[email or web address] @  RADIOLOGY:  Ct Head Wo Contrast  Result Date: 07/24/2018 CLINICAL DATA:  Known subdural hematoma, follow-up, fell in  parking lot EXAM: CT HEAD WITHOUT CONTRAST TECHNIQUE: Contiguous axial images were obtained from the base of the skull through the vertex without intravenous contrast. Sagittal and coronal MPR images reconstructed from axial data set. COMPARISON:  Study at 1847 hours compared to 1337 hours FINDINGS: Brain: Mild atrophy. Normal ventricular morphology. No midline shift or mass effect. Small vessel chronic ischemic changes of deep cerebral white matter. Again identified parafalcine subdural hematoma up to 6 mm thick, previously 7 mm. No new areas of hemorrhage or infarction. No mass lesion. Vascular: Atherosclerotic calcification of internal carotid arteries bilaterally at skull base Skull: Intact Sinuses/Orbits: Clear Other: N/A IMPRESSION: Stable appearance of a small parafalcine subdural hematoma measuring 6 mm thick. No midline shift. Underlying atrophy and small vessel chronic ischemic changes of deep cerebral white matter. No new intracranial abnormalities. Electronically Signed   By: Lavonia Dana M.D.   On: 07/24/2018 19:10   Ct Head Wo Contrast  Result Date: 07/24/2018 CLINICAL DATA:  84 year old who fell earlier today and has RIGHT facial swelling and neck pain. Laceration to the lip. Patient amnestic to the event.  Initial encounter. EXAM: CT HEAD WITHOUT CONTRAST CT MAXILLOFACIAL WITHOUT CONTRAST CT CERVICAL SPINE WITHOUT CONTRAST TECHNIQUE: Multidetector CT imaging of the head, cervical spine, and maxillofacial structures were performed using the standard protocol without intravenous contrast. Multiplanar CT image reconstructions of the cervical spine and maxillofacial structures were also generated. A metallic BB was placed on the right temple in order to reliably differentiate right from left. COMPARISON:  None. FINDINGS: CT HEAD FINDINGS Brain: RIGHT parafalcine subdural hematoma extending upward to near the vertex, maximum thickness approximately 7 mm, without associated mass effect or midline shift. No evidence of parenchymal hemorrhage or hematoma. No evidence of subarachnoid hemorrhage or intraventricular hemorrhage. Ventricular system normal in size and appearance for age. Mild age-appropriate cortical atrophy. Moderate to severe changes of small vessel disease of the white matter diffusely. Vascular: Severe BILATERAL carotid siphon and vertebrobasilar atherosclerosis. No hyperdense vessel. Skull: No skull fracture or other focal osseous abnormality involving the skull. Other: None. CT MAXILLOFACIAL FINDINGS Osseous: No fractures identified involving the facial bones. Temporomandibular joints anatomically aligned without significant degenerative change. Osseous demineralization. Edentulous. Orbits: No orbital fractures. No evidence of orbital hemorrhage. Extraocular muscles normal in appearance throughout. Hyperdensity in both lens, query cataracts. Senile calcification involving the RIGHT globe. Sinuses: Opacification of a solitary LEFT POSTERIOR ethmoid air cell. Paranasal sinuses otherwise well aerated throughout. BILATERAL middle ear cavities and BILATERAL mastoid air cells well-aerated. Bony nasal septal deviation to the RIGHT. Soft tissues: Hematoma in the subcutaneous tissues of the RIGHT cheek measuring  approximately 2.7 x 1.7 cm. Small hematoma involving the RIGHT side of the cheek in measuring approximately 0.8 x 1.3 cm. CT CERVICAL SPINE FINDINGS Alignment: Anatomic POSTERIOR alignment. Straightening of the usual lordosis. Facet joints anatomically aligned throughout with diffuse degenerative changes. Skull base and vertebrae: No fractures identified involving the cervical spine. Coronal reformatted images demonstrate an intact craniocervical junction, intact dens and intact lateral masses throughout. Benign bone island in the LEFT lateral arch of C1. Soft tissues and spinal canal: No evidence of paraspinous or spinal canal hematoma. No evidence of spinal stenosis. Disc levels: Moderate disc space narrowing at C5-6 and mild disc space narrowing at C4-5. No visible disc protrusions or extrusions on the soft tissue window images. Combination of facet and uncinate hypertrophy account for multilevel foraminal stenoses including mild RIGHT C2-3, mild BILATERAL C3-4, severe BILATERAL C4-5, moderate BILATERAL C5-6. Upper chest:  Visualized lung apices clear. Mild atherosclerosis involving the visualized great vessels. Other: Mild BILATERAL cervical carotid atherosclerosis. IMPRESSION: 1. Small RIGHT parafalcine subdural hematoma, maximum thickness 7 mm, without associated mass effect or midline shift. No evidence of hemorrhage or hematoma elsewhere. 2. No acute intracranial abnormalities otherwise. 3. Mild age-appropriate cortical atrophy and moderate to severe chronic microvascular ischemic changes of the white matter. 4. No facial bone fractures identified. 5. Hematoma in the subcutaneous tissues of the RIGHT cheek and in the RIGHT side of the cheek. 6. No cervical spine fractures identified. 7. Multilevel degenerative disc disease, spondylosis and facet degenerative changes with multilevel foraminal stenoses as detailed above. I telephoned these results at the time of interpretation on 07/24/2018 at 2:18 pm to Dr.  Merlyn Lot, who verbally acknowledged these results. Electronically Signed   By: Evangeline Dakin M.D.   On: 07/24/2018 14:20   Ct Cervical Spine Wo Contrast  Result Date: 07/24/2018 CLINICAL DATA:  84 year old who fell earlier today and has RIGHT facial swelling and neck pain. Laceration to the lip. Patient amnestic to the event. Initial encounter. EXAM: CT HEAD WITHOUT CONTRAST CT MAXILLOFACIAL WITHOUT CONTRAST CT CERVICAL SPINE WITHOUT CONTRAST TECHNIQUE: Multidetector CT imaging of the head, cervical spine, and maxillofacial structures were performed using the standard protocol without intravenous contrast. Multiplanar CT image reconstructions of the cervical spine and maxillofacial structures were also generated. A metallic BB was placed on the right temple in order to reliably differentiate right from left. COMPARISON:  None. FINDINGS: CT HEAD FINDINGS Brain: RIGHT parafalcine subdural hematoma extending upward to near the vertex, maximum thickness approximately 7 mm, without associated mass effect or midline shift. No evidence of parenchymal hemorrhage or hematoma. No evidence of subarachnoid hemorrhage or intraventricular hemorrhage. Ventricular system normal in size and appearance for age. Mild age-appropriate cortical atrophy. Moderate to severe changes of small vessel disease of the white matter diffusely. Vascular: Severe BILATERAL carotid siphon and vertebrobasilar atherosclerosis. No hyperdense vessel. Skull: No skull fracture or other focal osseous abnormality involving the skull. Other: None. CT MAXILLOFACIAL FINDINGS Osseous: No fractures identified involving the facial bones. Temporomandibular joints anatomically aligned without significant degenerative change. Osseous demineralization. Edentulous. Orbits: No orbital fractures. No evidence of orbital hemorrhage. Extraocular muscles normal in appearance throughout. Hyperdensity in both lens, query cataracts. Senile calcification involving  the RIGHT globe. Sinuses: Opacification of a solitary LEFT POSTERIOR ethmoid air cell. Paranasal sinuses otherwise well aerated throughout. BILATERAL middle ear cavities and BILATERAL mastoid air cells well-aerated. Bony nasal septal deviation to the RIGHT. Soft tissues: Hematoma in the subcutaneous tissues of the RIGHT cheek measuring approximately 2.7 x 1.7 cm. Small hematoma involving the RIGHT side of the cheek in measuring approximately 0.8 x 1.3 cm. CT CERVICAL SPINE FINDINGS Alignment: Anatomic POSTERIOR alignment. Straightening of the usual lordosis. Facet joints anatomically aligned throughout with diffuse degenerative changes. Skull base and vertebrae: No fractures identified involving the cervical spine. Coronal reformatted images demonstrate an intact craniocervical junction, intact dens and intact lateral masses throughout. Benign bone island in the LEFT lateral arch of C1. Soft tissues and spinal canal: No evidence of paraspinous or spinal canal hematoma. No evidence of spinal stenosis. Disc levels: Moderate disc space narrowing at C5-6 and mild disc space narrowing at C4-5. No visible disc protrusions or extrusions on the soft tissue window images. Combination of facet and uncinate hypertrophy account for multilevel foraminal stenoses including mild RIGHT C2-3, mild BILATERAL C3-4, severe BILATERAL C4-5, moderate BILATERAL C5-6. Upper chest: Visualized lung apices clear. Mild  atherosclerosis involving the visualized great vessels. Other: Mild BILATERAL cervical carotid atherosclerosis. IMPRESSION: 1. Small RIGHT parafalcine subdural hematoma, maximum thickness 7 mm, without associated mass effect or midline shift. No evidence of hemorrhage or hematoma elsewhere. 2. No acute intracranial abnormalities otherwise. 3. Mild age-appropriate cortical atrophy and moderate to severe chronic microvascular ischemic changes of the white matter. 4. No facial bone fractures identified. 5. Hematoma in the  subcutaneous tissues of the RIGHT cheek and in the RIGHT side of the cheek. 6. No cervical spine fractures identified. 7. Multilevel degenerative disc disease, spondylosis and facet degenerative changes with multilevel foraminal stenoses as detailed above. I telephoned these results at the time of interpretation on 07/24/2018 at 2:18 pm to Dr. Merlyn Lot, who verbally acknowledged these results. Electronically Signed   By: Evangeline Dakin M.D.   On: 07/24/2018 14:20   Mr Jeri Cos ZO Contrast  Result Date: 07/24/2018 CLINICAL DATA:  Syncope.  Small acute subdural hematoma. EXAM: MRI HEAD WITHOUT AND WITH CONTRAST TECHNIQUE: Multiplanar, multiecho pulse sequences of the brain and surrounding structures were obtained without and with intravenous contrast. CONTRAST:  9 mL Gadavist COMPARISON:  Head CT 07/24/2018 FINDINGS: Brain: There is a thin subdural hematoma along the length of the falx cerebri. No midline shift or other mass effect. No acute ischemia. There is early confluent hyperintensity within the periventricular white matter, consistent with chronic ischemic microangiopathy. 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 There is mild hyperintense T2-weighted signal within the overlying brain. 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 Vascular: The major intracranial arterial and venous sinus flow voids are preserved. Skull and upper cervical spine: The visualized skull base, calvarium, upper cervical spine and extracranial soft tissues are normal. Sinuses/Orbits: No fluid levels or advanced mucosal thickening of the visualized paranasal sinuses. No mastoid or middle ear effusion. The orbits are normal. Other: None IMPRESSION: 1. Thin parafalcine subdural hematoma, unchanged compared to the earlier CT. 2. 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 Electronically Signed   By:  Ulyses Jarred M.D.   On: 07/24/2018 22:40   US Carotid Bilateral  Result Date: 07/25/2018 CLINICAL DATA:  84 year old female with syncope and collapse EXAM: BILATERAL CAROTID DUPLEX ULTRASOUND TECHNIQUE: Pearline Cables scale imaging, color Doppler and duplex ultrasound were performed of bilateral carotid and vertebral arteries in the neck. COMPARISON:  Brain MRI 07/24/2018 FINDINGS: Criteria: Quantification of carotid stenosis is based on velocity parameters that correlate the residual internal carotid diameter with NASCET-based stenosis levels, using the diameter of the distal internal carotid lumen as the denominator for stenosis measurement. The following velocity measurements were obtained: RIGHT ICA: 85/22 cm/sec CCA: 109/60 cm/sec SYSTOLIC ICA/CCA RATIO:  0.8 ECA:  71 cm/sec LEFT ICA: 93/14 cm/sec CCA: 45/40 cm/sec SYSTOLIC ICA/CCA RATIO:  1.1 ECA:  81 cm/sec RIGHT CAROTID ARTERY: No significant atherosclerotic plaque or evidence of stenosis. RIGHT VERTEBRAL ARTERY:  Patent with normal antegrade flow. LEFT CAROTID ARTERY: No significant atherosclerotic plaque or evidence of stenosis. LEFT VERTEBRAL ARTERY:  Patent with normal antegrade flow. IMPRESSION: Negative bilateral carotid duplex ultrasound. No significant atherosclerotic plaque or evidence of stenosis. Signed, Criselda Peaches, MD, [facility] Electronically Signed   By: Jacqulynn Cadet M.D.   On: 07/25/2018 08:48   Dg Chest Portable 1 View  Result Date: 07/24/2018 CLINICAL DATA:  Syncope. EXAM: PORTABLE CHEST 1 VIEW COMPARISON:  09/06/2007  FINDINGS: The heart size and mediastinal contours are within normal limits. Both lungs are clear. Chronic degenerative changes of both shoulders. Aortic atherosclerosis. IMPRESSION: 1. No acute cardiopulmonary disease. 2. Aortic atherosclerosis. Electronically Signed   By: Lorriane Shire M.D.   On: 07/24/2018 14:14   Ct Maxillofacial Wo  Contrast  Result Date: 07/24/2018 CLINICAL DATA:  84 year old who fell earlier today and has RIGHT facial swelling and neck pain. Laceration to the lip. Patient amnestic to the event. Initial encounter. EXAM: CT HEAD WITHOUT CONTRAST CT MAXILLOFACIAL WITHOUT CONTRAST CT CERVICAL SPINE WITHOUT CONTRAST TECHNIQUE: Multidetector CT imaging of the head, cervical spine, and maxillofacial structures were performed using the standard protocol without intravenous contrast. Multiplanar CT image reconstructions of the cervical spine and maxillofacial structures were also generated. A metallic BB was placed on the right temple in order to reliably differentiate right from left. COMPARISON:  None. FINDINGS: CT HEAD FINDINGS Brain: RIGHT parafalcine subdural hematoma extending upward to near the vertex, maximum thickness approximately 7 mm, without associated mass effect or midline shift. No evidence of parenchymal hemorrhage or hematoma. No evidence of subarachnoid hemorrhage or intraventricular hemorrhage. Ventricular system normal in size and appearance for age. Mild age-appropriate cortical atrophy. Moderate to severe changes of small vessel disease of the white matter diffusely. Vascular: Severe BILATERAL carotid siphon and vertebrobasilar atherosclerosis. No hyperdense vessel. Skull: No skull fracture or other focal osseous abnormality involving the skull. Other: None. CT MAXILLOFACIAL FINDINGS Osseous: No fractures identified involving the facial bones. Temporomandibular joints anatomically aligned without significant degenerative change. Osseous demineralization. Edentulous. Orbits: No orbital fractures. No evidence of orbital hemorrhage. Extraocular muscles normal in appearance throughout. Hyperdensity in both lens, query cataracts. Senile calcification involving the RIGHT globe. Sinuses: Opacification of a solitary LEFT POSTERIOR ethmoid air cell. Paranasal sinuses otherwise well aerated throughout. BILATERAL middle  ear cavities and BILATERAL mastoid air cells well-aerated. Bony nasal septal deviation to the RIGHT. Soft tissues: Hematoma in the subcutaneous tissues of the RIGHT cheek measuring approximately 2.7 x 1.7 cm. Small hematoma involving the RIGHT side of the cheek in measuring approximately 0.8 x 1.3 cm. CT CERVICAL SPINE FINDINGS Alignment: Anatomic POSTERIOR alignment. Straightening of the usual lordosis. Facet joints anatomically aligned throughout with diffuse degenerative changes. Skull base and vertebrae: No fractures identified involving the cervical spine. Coronal reformatted images demonstrate an intact craniocervical junction, intact dens and intact lateral masses throughout. Benign bone island in the LEFT lateral arch of C1. Soft tissues and spinal canal: No evidence of paraspinous or spinal canal hematoma. No evidence of spinal stenosis. Disc levels: Moderate disc space narrowing at C5-6 and mild disc space narrowing at C4-5. No visible disc protrusions or extrusions on the soft tissue window images. Combination of facet and uncinate hypertrophy account for multilevel foraminal stenoses including mild RIGHT C2-3, mild BILATERAL C3-4, severe BILATERAL C4-5, moderate BILATERAL C5-6. Upper chest: Visualized lung apices clear. Mild atherosclerosis involving the visualized great vessels. Other: Mild BILATERAL cervical carotid atherosclerosis. IMPRESSION: 1. Small RIGHT parafalcine subdural hematoma, maximum thickness 7 mm, without associated mass effect or midline shift. No evidence of hemorrhage or hematoma elsewhere. 2. No acute intracranial abnormalities otherwise. 3. Mild age-appropriate cortical atrophy and moderate to severe chronic microvascular ischemic changes of the white matter. 4. No facial bone fractures identified. 5. Hematoma in the subcutaneous tissues of the RIGHT cheek and in the RIGHT side of the cheek. 6. No cervical spine fractures identified. 7. Multilevel degenerative disc disease,  spondylosis and facet degenerative changes with multilevel foraminal  stenoses as detailed above. I telephoned these results at the time of interpretation on 07/24/2018 at 2:18 pm to Dr. Merlyn Lot, who verbally acknowledged these results. Electronically Signed   By: Evangeline Dakin M.D.   On: 07/24/2018 14:20      Allergies as of 07/26/2018      Reactions   Lisinopril       Medication List    STOP taking these medications   aspirin 81 MG tablet     TAKE these medications   alendronate 35 MG tablet Commonly known as: FOSAMAX TAKE 1 TABLET BY MOUTH WEEKLY WITH A FULL GLASS OF WATER ON AN EMPTY STOMACH.   atorvastatin 40 MG tablet Commonly known as: LIPITOR Take 1 tablet (40 mg total) by mouth daily.   ferrous sulfate 325 (65 FE) MG tablet Take 325 mg by mouth 2 (two) times daily with a meal.   furosemide 40 MG tablet Commonly known as: Lasix Take 1 tablet (40 mg total) by mouth daily as needed.   Glucosamine Sulfate 500 MG Tabs Take 1 tablet by mouth once a week.   loratadine 10 MG tablet Commonly known as: CLARITIN Take 1 tablet (10 mg total) by mouth daily.   metFORMIN 1000 MG tablet Commonly known as: GLUCOPHAGE Take 1 tablet (1,000 mg total) by mouth 2 (two) times daily with a meal.   montelukast 10 MG tablet Commonly known as: SINGULAIR Take 1 tablet (10 mg total) by mouth at bedtime.   Multi-Vitamins Tabs Take by mouth.   Potassium Chloride ER 20 MEQ Tbcr Take 10 mEq by mouth daily as needed. With lasix   repaglinide 2 MG tablet Commonly known as: PRANDIN Take 1 tablet (2 mg total) by mouth 3 (three) times daily.   telmisartan 40 MG tablet Commonly known as: Micardis Take 1 tablet (40 mg total) by mouth daily.   vitamin C 500 MG tablet Commonly known as: ASCORBIC ACID Take 500 mg by mouth daily.   Vitamin D 50 MCG (2000 UT) Caps Take 1 capsule (2,000 Units total) by mouth daily.   Zinc Acetate 50 MG Caps Take by mouth daily.          Management plans discussed with the patient and she is in agreement. Stable for discharge home with [facility]  Patient should follow up with cardiology for ZIO  CODE STATUS:     Code Status Orders  (From admission, onward)         Start     Ordered   07/24/18 2108  Full code  Continuous     07/24/18 2107        Code Status History    This patient has a current code status but no historical code status.   Advance Care Planning Activity      TOTAL TIME TAKING CARE OF THIS PATIENT: 39 minutes.    Note: This dictation was prepared with Dragon dictation along with smaller phrase technology. Any transcriptional errors that result from this process are unintentional.  Bettey Costa M.D on 07/26/2018 at 10:59 AM  Between 7am to 6pm - Pager - [phone] After 6pm go to [email or web address] - password [email or web address]  Office  [phone]  CC: Primary care physician; Steele Sizer, MD

## 2018-07-26 NOTE — Plan of Care (Signed)
  Problem: Education: Goal: Knowledge of General Education information will improve Description: Including pain rating scale, medication(s)/side effects and non-pharmacologic comfort measures 07/26/2018 1335 by Jenene Slicker, RN Outcome: Adequate for Discharge 07/26/2018 1334 by Jenene Slicker, RN Outcome: Adequate for Discharge   Problem: Health Behavior/Discharge Planning: Goal: Ability to manage health-related needs will improve 07/26/2018 1335 by Jenene Slicker, RN Outcome: Adequate for Discharge 07/26/2018 1334 by Jenene Slicker, RN Outcome: Adequate for Discharge   Problem: Clinical Measurements: Goal: Ability to maintain clinical measurements within normal limits will improve 07/26/2018 1335 by Jenene Slicker, RN Outcome: Adequate for Discharge 07/26/2018 1334 by Jenene Slicker, RN Outcome: Adequate for Discharge Goal: Will remain free from infection 07/26/2018 1335 by Jenene Slicker, RN Outcome: Adequate for Discharge 07/26/2018 1334 by Jenene Slicker, RN Outcome: Adequate for Discharge Goal: Diagnostic test results will improve 07/26/2018 1335 by Jenene Slicker, RN Outcome: Adequate for Discharge 07/26/2018 1334 by Jenene Slicker, RN Outcome: Adequate for Discharge Goal: Respiratory complications will improve 07/26/2018 1335 by Jenene Slicker, RN Outcome: Adequate for Discharge 07/26/2018 1334 by Jenene Slicker, RN Outcome: Adequate for Discharge Goal: Cardiovascular complication will be avoided 07/26/2018 1335 by Jenene Slicker, RN Outcome: Adequate for Discharge 07/26/2018 1334 by Jenene Slicker, RN Outcome: Adequate for Discharge   Problem: Activity: Goal: Risk for activity intolerance will decrease 07/26/2018 1335 by Jenene Slicker, RN Outcome: Adequate for Discharge 07/26/2018 1334 by Jenene Slicker, RN Outcome: Adequate for Discharge   Problem: Nutrition: Goal: Adequate nutrition will be maintained 07/26/2018 1335 by Jenene Slicker, RN Outcome:  Adequate for Discharge 07/26/2018 1334 by Jenene Slicker, RN Outcome: Adequate for Discharge   Problem: Coping: Goal: Level of anxiety will decrease 07/26/2018 1335 by Jenene Slicker, RN Outcome: Adequate for Discharge 07/26/2018 1334 by Jenene Slicker, RN Outcome: Adequate for Discharge   Problem: Elimination: Goal: Will not experience complications related to bowel motility 07/26/2018 1335 by Jenene Slicker, RN Outcome: Adequate for Discharge 07/26/2018 1334 by Jenene Slicker, RN Outcome: Adequate for Discharge Goal: Will not experience complications related to urinary retention 07/26/2018 1335 by Jenene Slicker, RN Outcome: Adequate for Discharge 07/26/2018 1334 by Jenene Slicker, RN Outcome: Adequate for Discharge   Problem: Pain Managment: Goal: General experience of comfort will improve 07/26/2018 1335 by Jenene Slicker, RN Outcome: Adequate for Discharge 07/26/2018 1334 by Jenene Slicker, RN Outcome: Adequate for Discharge   Problem: Safety: Goal: Ability to remain free from injury will improve 07/26/2018 1335 by Jenene Slicker, RN Outcome: Adequate for Discharge 07/26/2018 1334 by Jenene Slicker, RN Outcome: Adequate for Discharge   Problem: Skin Integrity: Goal: Risk for impaired skin integrity will decrease 07/26/2018 1335 by Jenene Slicker, RN Outcome: Adequate for Discharge 07/26/2018 1334 by Jenene Slicker, RN Outcome: Adequate for Discharge

## 2018-07-26 NOTE — Consult Note (Signed)
ELECTROPHYSIOLOGY CONSULT NOTE  Patient ID: Beverly [name], MRN: [id number], DOB/AGE: 1934-07-17 84 y.o. Admit date: 07/24/2018 Date of Consult: 07/26/2018  Primary Physician: Steele Sizer, MD Primary Cardiologist: none Beverly [name] is a 84 y.o. female who is being seen today for the evaluation of syncope at the request of Dr Ancil Boozer .   Chief Complaint: syncope   HPI Beverly [name] is a 84 y.o. female with Hx of HTN, DM admitted Tuesday following a fall in the parking lot at [facility] pediatrics.  She had taken her 75 year old granddaughter.  Her day was normal.  She had eaten.  There is no lightheadedness.  No palpitations.  She was walking in the parking lot and then without warning or even prompted prodromal symptoms, lost consciousness and fell and hit her head.  She has no prior history of syncope.  No prior palpitations.  Mild orthostatic lightheadedness.  There was no residual nausea diaphoresis or flushing.  Fall Cx by small SDH  The patient denies chest pain, shortness of breath, nocturnal dyspnea, orthopnea or peripheral edema.  .    Orthostatic VS normal 7/20  Labs notable for mild anemia 11.2; Delta hsTN neg ( [id number])     Past Medical History:  Diagnosis Date  . Allergy   . Arthritis   . Diabetes mellitus without complication ([place])   . Hyperlipidemia   . Hypertension       Surgical History:  Past Surgical History:  Procedure Laterality Date  . ABDOMINAL HYSTERECTOMY       Home Meds: Prior to Admission medications   Medication Sig Start Date End Date Taking? Authorizing Provider  alendronate (FOSAMAX) 35 MG tablet TAKE 1 TABLET BY MOUTH WEEKLY WITH A FULL GLASS OF WATER ON AN EMPTY STOMACH. 05/02/18  Yes Sowles, Drue Stager, MD  aspirin 81 MG tablet Take by mouth.   Yes [provider]  atorvastatin (LIPITOR) 40 MG tablet Take 1 tablet (40 mg total) by mouth daily. 07/02/18  Yes Sowles, Drue Stager, MD  Cholecalciferol (VITAMIN D) 2000 units  CAPS Take 1 capsule (2,000 Units total) by mouth daily. 10/14/16  Yes Sowles, Drue Stager, MD  ferrous sulfate 325 (65 FE) MG tablet Take 325 mg by mouth 2 (two) times daily with a meal.    Yes [provider]  furosemide (LASIX) 40 MG tablet Take 1 tablet (40 mg total) by mouth daily as needed. 10/06/15  Yes Sowles, Drue Stager, MD  Glucosamine Sulfate 500 MG TABS Take 1 tablet by mouth once a week.    Yes [provider]  loratadine (CLARITIN) 10 MG tablet Take 1 tablet (10 mg total) by mouth daily. 10/06/15  Yes Sowles, Drue Stager, MD  metFORMIN (GLUCOPHAGE) 1000 MG tablet Take 1 tablet (1,000 mg total) by mouth 2 (two) times daily with a meal. 07/02/18  Yes Sowles, Drue Stager, MD  montelukast (SINGULAIR) 10 MG tablet Take 1 tablet (10 mg total) by mouth at bedtime. 07/02/18  Yes Steele Sizer, MD  Multiple Vitamin (MULTI-VITAMINS) TABS Take by mouth.   Yes [provider]  Potassium Chloride ER 20 MEQ TBCR Take 10 mEq by mouth daily as needed. With lasix 10/06/15  Yes Sowles, Drue Stager, MD  repaglinide (PRANDIN) 2 MG tablet Take 1 tablet (2 mg total) by mouth 3 (three) times daily. 07/02/18  Yes Sowles, Drue Stager, MD  vitamin C (ASCORBIC ACID) 500 MG tablet Take 500 mg by mouth daily.   Yes [provider]  Zinc Acetate 50 MG CAPS Take by mouth  daily.    Yes [provider]  telmisartan (MICARDIS) 40 MG tablet Take 1 tablet (40 mg total) by mouth daily. 07/22/18   Steele Sizer, MD    Inpatient Medications:  . amLODipine  5 mg Oral Daily  . atorvastatin  40 mg Oral QPM  . ferrous sulfate  325 mg Oral BID WC  . insulin aspart  0-15 Units Subcutaneous TID WC  . insulin aspart  0-5 Units Subcutaneous QHS  . irbesartan  75 mg Oral Daily  . loratadine  10 mg Oral Daily  . metFORMIN  1,000 mg Oral BID WC  . montelukast  10 mg Oral QHS  . potassium chloride  20 mEq Oral BID  . repaglinide  2 mg Oral TID  . sodium chloride flush  3 mL Intravenous Q12H  . vitamin C  500  mg Oral Daily     Allergies:  Allergies  Allergen Reactions  . Lisinopril     Social History   Socioeconomic History  . Marital status: Widowed    Spouse name: [name]  . Number of children: 2  . Years of education: Not on file  . Highest education level: Bachelor's degree (e.g., BA, AB, BS)  Occupational History  . Occupation: Retired  Scientific laboratory technician  . Financial resource strain: Not hard at all  . Food insecurity    Worry: Never true    Inability: Never true  . Transportation needs    Medical: No    Non-medical: No  Tobacco Use  . Smoking status: Never Smoker  . Smokeless tobacco: Never Used  . Tobacco comment: smoking cessation materials not required  Substance and Sexual Activity  . Alcohol use: No    Alcohol/week: 0.0 standard drinks  . Drug use: No  . Sexual activity: Not Currently  Lifestyle  . Physical activity    Days per week: 3 days    Minutes per session: 40 min  . Stress: Not at all  Relationships  . Social Herbalist on phone: Patient refused    Gets together: Patient refused    Attends religious service: Patient refused    Active member of club or organization: Patient refused    Attends meetings of clubs or organizations: Patient refused    Relationship status: Widowed  . Intimate partner violence    Fear of current or ex partner: No    Emotionally abused: No    Physically abused: No    Forced sexual activity: No  Other Topics Concern  . Not on file  Social History Narrative  . Not on file     Family History  Problem Relation Age of Onset  . Hypertension Mother   . Healthy Father   . Healthy Daughter   . Raynaud syndrome Daughter   . Hypertension Daughter   . Stroke Sister      ROS:  Please see the history of present illness.     All other systems reviewed and negative.    Physical Exam: Blood pressure (!) 144/71, pulse 83, temperature 99.1 F (37.3 C), temperature source Oral, resp. rate 17, height 5\' 5"  (1.651 m),  weight 94.6 kg, SpO2 98 %. General: Well developed, well nourished female in no acute distress. Head: Normocephalic, face is bruised and her lip is swollen, sclera non-icteric, no xanthomas, nares are without discharge. EENT: normal Lymph Nodes:  none Back: without scoliosis/kyphosis, no CVA tendersness Neck: Negative for carotid bruits. JVD not elevated. Lungs: Clear bilaterally to auscultation without  wheezes, rales, or rhonchi. Breathing is unlabored. Heart: RRR with S1 S2. 2/6 systolic murmur , rubs, or gallops appreciated. Abdomen: Soft, non-tender, non-distended with normoactive bowel sounds. No hepatomegaly. No rebound/guarding. No obvious abdominal masses. Msk:  Strength and tone appear normal for age. Extremities: No clubbing or cyanosis. N edema.  Distal pedal pulses are 2+ and equal bilaterally. Skin: Warm and Dry Neuro: Alert and oriented X 3. CN III-XII intact Grossly normal sensory and motor function . Psych:  Responds to questions appropriately with a normal affect.      Labs: Cardiac Enzymes No results for input(s): CKTOTAL, CKMB, TROPONINI in the last 72 hours. CBC Lab Results  Component Value Date   WBC 7.9 07/25/2018   HGB 11.2 (L) 07/25/2018   HCT 34.7 (L) 07/25/2018   MCV 97.2 07/25/2018   PLT 189 07/25/2018   PROTIME: Recent Labs    07/25/18 0437  LABPROT 15.9*  INR 1.3*   Chemistry  Recent Labs  Lab 07/24/18 1326 07/25/18 0437  NA 141 141  K 4.2 3.5  CL 106 107  CO2 21* 26  BUN 17 10  CREATININE 0.84 0.63  CALCIUM 9.1 8.6*  PROT 6.7  --   BILITOT 0.7  --   ALKPHOS 47  --   ALT 12  --   AST 25  --   GLUCOSE 142* 107*   Lipids Lab Results  Component Value Date   CHOL 108 06/23/2017   HDL 45 (L) 06/23/2017   LDLCALC 49 06/23/2017   TRIG 56 06/23/2017   BNP No results found for: PROBNP Thyroid Function Tests: Recent Labs    07/25/18 0437  TSH 1.095      Miscellaneous No results found for: DDIMER  Radiology/Studies:  Ct  Head Wo Contrast  Result Date: 07/24/2018 CLINICAL DATA:  Known subdural hematoma, follow-up, fell in parking lot EXAM: CT HEAD WITHOUT CONTRAST TECHNIQUE: Contiguous axial images were obtained from the base of the skull through the vertex without intravenous contrast. Sagittal and coronal MPR images reconstructed from axial data set. COMPARISON:  Study at 1847 hours compared to 1337 hours FINDINGS: Brain: Mild atrophy. Normal ventricular morphology. No midline shift or mass effect. Small vessel chronic ischemic changes of deep cerebral white matter. Again identified parafalcine subdural hematoma up to 6 mm thick, previously 7 mm. No new areas of hemorrhage or infarction. No mass lesion. Vascular: Atherosclerotic calcification of internal carotid arteries bilaterally at skull base Skull: Intact Sinuses/Orbits: Clear Other: N/A IMPRESSION: Stable appearance of a small parafalcine subdural hematoma measuring 6 mm thick. No midline shift. Underlying atrophy and small vessel chronic ischemic changes of deep cerebral white matter. No new intracranial abnormalities. Electronically Signed   By: Lavonia Dana M.D.   On: 07/24/2018 19:10   Ct Head Wo Contrast  Result Date: 07/24/2018 CLINICAL DATA:  84 year old who fell earlier today and has RIGHT facial swelling and neck pain. Laceration to the lip. Patient amnestic to the event. Initial encounter. EXAM: CT HEAD WITHOUT CONTRAST CT MAXILLOFACIAL WITHOUT CONTRAST CT CERVICAL SPINE WITHOUT CONTRAST TECHNIQUE: Multidetector CT imaging of the head, cervical spine, and maxillofacial structures were performed using the standard protocol without intravenous contrast. Multiplanar CT image reconstructions of the cervical spine and maxillofacial structures were also generated. A metallic BB was placed on the right temple in order to reliably differentiate right from left. COMPARISON:  None. FINDINGS: CT HEAD FINDINGS Brain: RIGHT parafalcine subdural hematoma extending upward to  near the vertex, maximum thickness approximately 7 mm,  without associated mass effect or midline shift. No evidence of parenchymal hemorrhage or hematoma. No evidence of subarachnoid hemorrhage or intraventricular hemorrhage. Ventricular system normal in size and appearance for age. Mild age-appropriate cortical atrophy. Moderate to severe changes of small vessel disease of the white matter diffusely. Vascular: Severe BILATERAL carotid siphon and vertebrobasilar atherosclerosis. No hyperdense vessel. Skull: No skull fracture or other focal osseous abnormality involving the skull. Other: None. CT MAXILLOFACIAL FINDINGS Osseous: No fractures identified involving the facial bones. Temporomandibular joints anatomically aligned without significant degenerative change. Osseous demineralization. Edentulous. Orbits: No orbital fractures. No evidence of orbital hemorrhage. Extraocular muscles normal in appearance throughout. Hyperdensity in both lens, query cataracts. Senile calcification involving the RIGHT globe. Sinuses: Opacification of a solitary LEFT POSTERIOR ethmoid air cell. Paranasal sinuses otherwise well aerated throughout. BILATERAL middle ear cavities and BILATERAL mastoid air cells well-aerated. Bony nasal septal deviation to the RIGHT. Soft tissues: Hematoma in the subcutaneous tissues of the RIGHT cheek measuring approximately 2.7 x 1.7 cm. Small hematoma involving the RIGHT side of the cheek in measuring approximately 0.8 x 1.3 cm. CT CERVICAL SPINE FINDINGS Alignment: Anatomic POSTERIOR alignment. Straightening of the usual lordosis. Facet joints anatomically aligned throughout with diffuse degenerative changes. Skull base and vertebrae: No fractures identified involving the cervical spine. Coronal reformatted images demonstrate an intact craniocervical junction, intact dens and intact lateral masses throughout. Benign bone island in the LEFT lateral arch of C1. Soft tissues and spinal canal: No evidence  of paraspinous or spinal canal hematoma. No evidence of spinal stenosis. Disc levels: Moderate disc space narrowing at C5-6 and mild disc space narrowing at C4-5. No visible disc protrusions or extrusions on the soft tissue window images. Combination of facet and uncinate hypertrophy account for multilevel foraminal stenoses including mild RIGHT C2-3, mild BILATERAL C3-4, severe BILATERAL C4-5, moderate BILATERAL C5-6. Upper chest: Visualized lung apices clear. Mild atherosclerosis involving the visualized great vessels. Other: Mild BILATERAL cervical carotid atherosclerosis. IMPRESSION: 1. Small RIGHT parafalcine subdural hematoma, maximum thickness 7 mm, without associated mass effect or midline shift. No evidence of hemorrhage or hematoma elsewhere. 2. No acute intracranial abnormalities otherwise. 3. Mild age-appropriate cortical atrophy and moderate to severe chronic microvascular ischemic changes of the white matter. 4. No facial bone fractures identified. 5. Hematoma in the subcutaneous tissues of the RIGHT cheek and in the RIGHT side of the cheek. 6. No cervical spine fractures identified. 7. Multilevel degenerative disc disease, spondylosis and facet degenerative changes with multilevel foraminal stenoses as detailed above. I telephoned these results at the time of interpretation on 07/24/2018 at 2:18 pm to Dr. Merlyn Lot, who verbally acknowledged these results. Electronically Signed   By: Evangeline Dakin M.D.   On: 07/24/2018 14:20   Ct Cervical Spine Wo Contrast  Result Date: 07/24/2018 CLINICAL DATA:  84 year old who fell earlier today and has RIGHT facial swelling and neck pain. Laceration to the lip. Patient amnestic to the event. Initial encounter. EXAM: CT HEAD WITHOUT CONTRAST CT MAXILLOFACIAL WITHOUT CONTRAST CT CERVICAL SPINE WITHOUT CONTRAST TECHNIQUE: Multidetector CT imaging of the head, cervical spine, and maxillofacial structures were performed using the standard protocol without  intravenous contrast. Multiplanar CT image reconstructions of the cervical spine and maxillofacial structures were also generated. A metallic BB was placed on the right temple in order to reliably differentiate right from left. COMPARISON:  None. FINDINGS: CT HEAD FINDINGS Brain: RIGHT parafalcine subdural hematoma extending upward to near the vertex, maximum thickness approximately 7 mm, without associated mass effect or  midline shift. No evidence of parenchymal hemorrhage or hematoma. No evidence of subarachnoid hemorrhage or intraventricular hemorrhage. Ventricular system normal in size and appearance for age. Mild age-appropriate cortical atrophy. Moderate to severe changes of small vessel disease of the white matter diffusely. Vascular: Severe BILATERAL carotid siphon and vertebrobasilar atherosclerosis. No hyperdense vessel. Skull: No skull fracture or other focal osseous abnormality involving the skull. Other: None. CT MAXILLOFACIAL FINDINGS Osseous: No fractures identified involving the facial bones. Temporomandibular joints anatomically aligned without significant degenerative change. Osseous demineralization. Edentulous. Orbits: No orbital fractures. No evidence of orbital hemorrhage. Extraocular muscles normal in appearance throughout. Hyperdensity in both lens, query cataracts. Senile calcification involving the RIGHT globe. Sinuses: Opacification of a solitary LEFT POSTERIOR ethmoid air cell. Paranasal sinuses otherwise well aerated throughout. BILATERAL middle ear cavities and BILATERAL mastoid air cells well-aerated. Bony nasal septal deviation to the RIGHT. Soft tissues: Hematoma in the subcutaneous tissues of the RIGHT cheek measuring approximately 2.7 x 1.7 cm. Small hematoma involving the RIGHT side of the cheek in measuring approximately 0.8 x 1.3 cm. CT CERVICAL SPINE FINDINGS Alignment: Anatomic POSTERIOR alignment. Straightening of the usual lordosis. Facet joints anatomically aligned  throughout with diffuse degenerative changes. Skull base and vertebrae: No fractures identified involving the cervical spine. Coronal reformatted images demonstrate an intact craniocervical junction, intact dens and intact lateral masses throughout. Benign bone island in the LEFT lateral arch of C1. Soft tissues and spinal canal: No evidence of paraspinous or spinal canal hematoma. No evidence of spinal stenosis. Disc levels: Moderate disc space narrowing at C5-6 and mild disc space narrowing at C4-5. No visible disc protrusions or extrusions on the soft tissue window images. Combination of facet and uncinate hypertrophy account for multilevel foraminal stenoses including mild RIGHT C2-3, mild BILATERAL C3-4, severe BILATERAL C4-5, moderate BILATERAL C5-6. Upper chest: Visualized lung apices clear. Mild atherosclerosis involving the visualized great vessels. Other: Mild BILATERAL cervical carotid atherosclerosis. IMPRESSION: 1. Small RIGHT parafalcine subdural hematoma, maximum thickness 7 mm, without associated mass effect or midline shift. No evidence of hemorrhage or hematoma elsewhere. 2. No acute intracranial abnormalities otherwise. 3. Mild age-appropriate cortical atrophy and moderate to severe chronic microvascular ischemic changes of the white matter. 4. No facial bone fractures identified. 5. Hematoma in the subcutaneous tissues of the RIGHT cheek and in the RIGHT side of the cheek. 6. No cervical spine fractures identified. 7. Multilevel degenerative disc disease, spondylosis and facet degenerative changes with multilevel foraminal stenoses as detailed above. I telephoned these results at the time of interpretation on 07/24/2018 at 2:18 pm to Dr. Merlyn Lot, who verbally acknowledged these results. Electronically Signed   By: Evangeline Dakin M.D.   On: 07/24/2018 14:20   Mr Jeri Cos EG Contrast  Result Date: 07/24/2018 CLINICAL DATA:  Syncope.  Small acute subdural hematoma. EXAM: MRI HEAD  WITHOUT AND WITH CONTRAST TECHNIQUE: Multiplanar, multiecho pulse sequences of the brain and surrounding structures were obtained without and with intravenous contrast. CONTRAST:  9 mL Gadavist COMPARISON:  Head CT 07/24/2018 FINDINGS: Brain: There is a thin subdural hematoma along the length of the falx cerebri. No midline shift or other mass effect. No acute ischemia. There is early confluent hyperintensity within the periventricular white matter, consistent with chronic ischemic microangiopathy. 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 There is mild hyperintense T2-weighted signal within the overlying brain. 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 Vascular: The major  intracranial arterial and venous sinus flow voids are preserved. Skull and upper cervical spine: The visualized skull base, calvarium, upper cervical spine and extracranial soft tissues are normal. Sinuses/Orbits: No fluid levels or advanced mucosal thickening of the visualized paranasal sinuses. No mastoid or middle ear effusion. The orbits are normal. Other: None IMPRESSION: 1. Thin parafalcine subdural hematoma, unchanged compared to the earlier CT. 2. 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 Electronically Signed   By: Ulyses Jarred M.D.   On: 07/24/2018 22:40   US Renal  Result Date: 07/11/2018 CLINICAL DATA:  Hematuria, history of LEFT side kidney stone EXAM: RENAL / URINARY TRACT ULTRASOUND COMPLETE COMPARISON:  01/16/2018 FINDINGS: Right Kidney: Renal measurements: 9.9 x 4.3 x 4.2 cm = volume: 94 mL. Mild cortical thinning. Normal cortical echogenicity. No mass, hydronephrosis or shadowing calcification. Left Kidney: Renal measurements: 11.5 x 6.1 x 5.3 cm = volume: 195 mL. Normal cortical thickness and echogenicity. 7 mm nonshadowing echogenic focus at mid kidney question artifact versus calculus. No mass  or hydronephrosis. Bladder: Contains only a small amount of urine, grossly unremarkable. Ureteral jets were not visualized. IMPRESSION: Questionable 7 mm nonshadowing calculus versus artifact at mid LEFT kidney. Otherwise negative exam. Electronically Signed   By: Lavonia Dana M.D.   On: 07/11/2018 16:33   US Carotid Bilateral  Result Date: 07/25/2018 CLINICAL DATA:  84 year old female with syncope and collapse EXAM: BILATERAL CAROTID DUPLEX ULTRASOUND TECHNIQUE: Pearline Cables scale imaging, color Doppler and duplex ultrasound were performed of bilateral carotid and vertebral arteries in the neck. COMPARISON:  Brain MRI 07/24/2018 FINDINGS: Criteria: Quantification of carotid stenosis is based on velocity parameters that correlate the residual internal carotid diameter with NASCET-based stenosis levels, using the diameter of the distal internal carotid lumen as the denominator for stenosis measurement. The following velocity measurements were obtained: RIGHT ICA: 85/22 cm/sec CCA: 409/81 cm/sec SYSTOLIC ICA/CCA RATIO:  0.8 ECA:  71 cm/sec LEFT ICA: 93/14 cm/sec CCA: 19/14 cm/sec SYSTOLIC ICA/CCA RATIO:  1.1 ECA:  81 cm/sec RIGHT CAROTID ARTERY: No significant atherosclerotic plaque or evidence of stenosis. RIGHT VERTEBRAL ARTERY:  Patent with normal antegrade flow. LEFT CAROTID ARTERY: No significant atherosclerotic plaque or evidence of stenosis. LEFT VERTEBRAL ARTERY:  Patent with normal antegrade flow. IMPRESSION: Negative bilateral carotid duplex ultrasound. No significant atherosclerotic plaque or evidence of stenosis. Signed, Criselda Peaches, MD, [facility] Electronically Signed   By: Jacqulynn Cadet M.D.   On: 07/25/2018 08:48   Dg Chest Portable 1 View  Result Date: 07/24/2018 CLINICAL DATA:  Syncope. EXAM: PORTABLE CHEST 1 VIEW COMPARISON:  09/06/2007 FINDINGS: The heart size and mediastinal contours are within normal limits. Both lungs  are clear. Chronic degenerative changes of both shoulders. Aortic atherosclerosis. IMPRESSION: 1. No acute cardiopulmonary disease. 2. Aortic atherosclerosis. Electronically Signed   By: Lorriane Shire M.D.   On: 07/24/2018 14:14   Ct Maxillofacial Wo Contrast  Result Date: 07/24/2018 CLINICAL DATA:  84 year old who fell earlier today and has RIGHT facial swelling and neck pain. Laceration to the lip. Patient amnestic to the event. Initial encounter. EXAM: CT HEAD WITHOUT CONTRAST CT MAXILLOFACIAL WITHOUT CONTRAST CT CERVICAL SPINE WITHOUT CONTRAST TECHNIQUE: Multidetector CT imaging of the head, cervical spine, and maxillofacial structures were performed using the standard protocol without intravenous contrast. Multiplanar CT image reconstructions of the cervical spine and maxillofacial structures were also generated. A metallic BB was placed on the right temple in order to reliably differentiate right  from left. COMPARISON:  None. FINDINGS: CT HEAD FINDINGS Brain: RIGHT parafalcine subdural hematoma extending upward to near the vertex, maximum thickness approximately 7 mm, without associated mass effect or midline shift. No evidence of parenchymal hemorrhage or hematoma. No evidence of subarachnoid hemorrhage or intraventricular hemorrhage. Ventricular system normal in size and appearance for age. Mild age-appropriate cortical atrophy. Moderate to severe changes of small vessel disease of the white matter diffusely. Vascular: Severe BILATERAL carotid siphon and vertebrobasilar atherosclerosis. No hyperdense vessel. Skull: No skull fracture or other focal osseous abnormality involving the skull. Other: None. CT MAXILLOFACIAL FINDINGS Osseous: No fractures identified involving the facial bones. Temporomandibular joints anatomically aligned without significant degenerative change. Osseous demineralization. Edentulous. Orbits: No orbital fractures. No evidence of orbital hemorrhage. Extraocular muscles normal in  appearance throughout. Hyperdensity in both lens, query cataracts. Senile calcification involving the RIGHT globe. Sinuses: Opacification of a solitary LEFT POSTERIOR ethmoid air cell. Paranasal sinuses otherwise well aerated throughout. BILATERAL middle ear cavities and BILATERAL mastoid air cells well-aerated. Bony nasal septal deviation to the RIGHT. Soft tissues: Hematoma in the subcutaneous tissues of the RIGHT cheek measuring approximately 2.7 x 1.7 cm. Small hematoma involving the RIGHT side of the cheek in measuring approximately 0.8 x 1.3 cm. CT CERVICAL SPINE FINDINGS Alignment: Anatomic POSTERIOR alignment. Straightening of the usual lordosis. Facet joints anatomically aligned throughout with diffuse degenerative changes. Skull base and vertebrae: No fractures identified involving the cervical spine. Coronal reformatted images demonstrate an intact craniocervical junction, intact dens and intact lateral masses throughout. Benign bone island in the LEFT lateral arch of C1. Soft tissues and spinal canal: No evidence of paraspinous or spinal canal hematoma. No evidence of spinal stenosis. Disc levels: Moderate disc space narrowing at C5-6 and mild disc space narrowing at C4-5. No visible disc protrusions or extrusions on the soft tissue window images. Combination of facet and uncinate hypertrophy account for multilevel foraminal stenoses including mild RIGHT C2-3, mild BILATERAL C3-4, severe BILATERAL C4-5, moderate BILATERAL C5-6. Upper chest: Visualized lung apices clear. Mild atherosclerosis involving the visualized great vessels. Other: Mild BILATERAL cervical carotid atherosclerosis. IMPRESSION: 1. Small RIGHT parafalcine subdural hematoma, maximum thickness 7 mm, without associated mass effect or midline shift. No evidence of hemorrhage or hematoma elsewhere. 2. No acute intracranial abnormalities otherwise. 3. Mild age-appropriate cortical atrophy and moderate to severe chronic microvascular ischemic  changes of the white matter. 4. No facial bone fractures identified. 5. Hematoma in the subcutaneous tissues of the RIGHT cheek and in the RIGHT side of the cheek. 6. No cervical spine fractures identified. 7. Multilevel degenerative disc disease, spondylosis and facet degenerative changes with multilevel foraminal stenoses as detailed above. I telephoned these results at the time of interpretation on 07/24/2018 at 2:18 pm to Dr. Merlyn Lot, who verbally acknowledged these results. Electronically Signed   By: Evangeline Dakin M.D.   On: 07/24/2018 14:20    EKG: sinus  21/08/42 O/w normal    Echo 7/20  Normal LV function   Carotid sinus massage negative  Assessment and Plan:  Syncope   The patient has had an isolated syncopal fall event, falling and hitting her head.  Relative brevity of the event as well as general incidence strongly suggests that this is cardiovascular and almost certainly not neurological.  Her electrocardiogram is essentially normal.  Her echocardiogram is also normal.  Both of these make an arrhythmic cause exceedingly unlikely.  Lack of prodrome is more common with arrhythmic syncope; however, it is seen with neurally mediated  syncope.  The fact that she has never had syncope before makes neurally mediated syncope less likely  Hence, her evaluation thus far is lacking in a specific diagnosis.    I would recommend discharge with a ZIO monitor.  This can be arranged through our office.  [phone].  In the event that this is unilluminating would consider an implantable monitor thereafter.  She is advised that in [place] she should refrain from driving for probably 6 weeks at least given the lack of prodrome and the lack of explanation.  We look forward to seeing her in the office in about 1 month's time.     Virl Axe

## 2018-07-26 NOTE — TOC Transition Note (Signed)
Transition of Care [facility]) - CM/SW Discharge Note   Patient Details  Name: RENATTA SHRIEVES MRN: [id number] Date of Birth: 1934-07-11  Transition of Care [facility]) CM/SW Contact:  Evelyna Folker, Lenice Llamas Phone Number: [phone]  07/26/2018, 10:07 AM   Clinical Narrative: Clinical Social Worker (CSW) notified Lauretta Chester home health representative that patient will D/C today. CSW made patient aware that [facility] is on a delay and will come see her in 4 days. Patient verbalized her understanding. RN aware of above. Please reonsult if future social work needs arise. CSW signing off.      Final next level of care: [place] Barriers to Discharge: Barriers Resolved   Patient Goals and CMS Choice Patient states their goals for this hospitalization and ongoing recovery are:: To feel better. CMS Medicare.gov Compare Post Acute Care list provided to:: Patient Choice offered to / list presented to : Patient  Discharge Placement                       Discharge Plan and Services In-house Referral: Clinical Social Work   Post Acute Care Choice: Home Health                    HH Arranged: PT [facility] Agency: [facility] Date [facility] Agency Contacted: 07/26/18 Time [place]: 1007 Representative spoke with at [place]: [place] Interventions     Readmission Risk Interventions No flowsheet data found.

## 2018-07-26 NOTE — Plan of Care (Signed)

## 2018-07-27 ENCOUNTER — Telehealth: Payer: Self-pay | Admitting: *Deleted

## 2018-07-27 ENCOUNTER — Other Ambulatory Visit: Payer: Self-pay | Admitting: *Deleted

## 2018-07-27 DIAGNOSIS — R55 Syncope and collapse: Secondary | ICD-10-CM

## 2018-07-27 NOTE — Telephone Encounter (Signed)
-----   Message from Nelva Bush, MD sent at 07/26/2018 10:57 AM EDT ----- Regarding: Tenny Craw,  Ms. Cuadrado was admitted with syncope and seen by Dr. Caryl Comes today as an inpatient.  Can you help arrange for a Zio (with live telemetry) at the time of discharge (likely this afternoon)?  She should f/u with Dr. Caryl Comes in ~1 month.  Thanks.  Gerald Stabs

## 2018-07-27 NOTE — Telephone Encounter (Signed)
lmov to schedule  °

## 2018-07-27 NOTE — Telephone Encounter (Signed)
ZIO AT order placed.  The patient had called the office and was in agreement with this being shipped to her.  I called and spoke with the patient to confirm her mailing address and emergency contact info. She is aware she should receive her ZIO AT in the next 2-5 business days.  She is aware to answer any calls from an 800# or 224# in the next few days as it may be the company calling her.    Monitor has been requested in [employer]io[employer].

## 2018-07-27 NOTE — Telephone Encounter (Signed)
To scheduling to please arrange a follow up appointment in 1 month with Dr. Caryl Comes.

## 2018-07-30 ENCOUNTER — Telehealth: Payer: Self-pay | Admitting: Gastroenterology

## 2018-07-30 NOTE — Telephone Encounter (Signed)
Patient called & would like to cancel her colonoscopy scheduled for 08-13-18 & r/s to the week of 09-23-18 if possible.She recently just had a fall & was out of the hospital.

## 2018-07-30 NOTE — Telephone Encounter (Signed)
Pt left  vm  She took a fall  On Tuesday and may need to reschedule her procedure to September please call pt cb [phone]

## 2018-08-02 ENCOUNTER — Ambulatory Visit (INDEPENDENT_AMBULATORY_CARE_PROVIDER_SITE_OTHER): Payer: Medicare Other

## 2018-08-02 DIAGNOSIS — R55 Syncope and collapse: Secondary | ICD-10-CM | POA: Diagnosis not present

## 2018-08-03 NOTE — Telephone Encounter (Signed)
LVM asking pt to return call concerning rescheduling procedure

## 2018-08-06 ENCOUNTER — Encounter: Payer: Self-pay | Admitting: Family Medicine

## 2018-08-06 ENCOUNTER — Ambulatory Visit: Payer: Medicare Other | Admitting: Family Medicine

## 2018-08-06 ENCOUNTER — Other Ambulatory Visit: Payer: Self-pay

## 2018-08-06 VITALS — BP 112/62 | HR 99 | Temp 96.8°F | Resp 14 | Ht 65.0 in | Wt 195.9 lb

## 2018-08-06 DIAGNOSIS — S065X9A Traumatic subdural hemorrhage with loss of consciousness of unspecified duration, initial encounter: Secondary | ICD-10-CM

## 2018-08-06 DIAGNOSIS — R55 Syncope and collapse: Secondary | ICD-10-CM | POA: Diagnosis not present

## 2018-08-06 DIAGNOSIS — S01511D Laceration without foreign body of lip, subsequent encounter: Secondary | ICD-10-CM

## 2018-08-06 DIAGNOSIS — E113553 Type 2 diabetes mellitus with stable proliferative diabetic retinopathy, bilateral: Secondary | ICD-10-CM | POA: Diagnosis not present

## 2018-08-06 DIAGNOSIS — S065XAA Traumatic subdural hemorrhage with loss of consciousness status unknown, initial encounter: Secondary | ICD-10-CM

## 2018-08-06 DIAGNOSIS — I10 Essential (primary) hypertension: Secondary | ICD-10-CM

## 2018-08-06 DIAGNOSIS — Z4802 Encounter for removal of sutures: Secondary | ICD-10-CM

## 2018-08-06 LAB — COMPLETE METABOLIC PANEL WITH GFR
AG Ratio: 1.5 (calc) (ref 1.0–2.5)
ALT: 5 U/L — ABNORMAL LOW (ref 6–29)
AST: 15 U/L (ref 10–35)
Albumin: 4.1 g/dL (ref 3.6–5.1)
Alkaline phosphatase (APISO): 54 U/L (ref 37–153)
BUN: 12 mg/dL (ref 7–25)
CO2: 28 mmol/L (ref 20–32)
Calcium: 9.8 mg/dL (ref 8.6–10.4)
Chloride: 106 mmol/L (ref 98–110)
Creat: 0.83 mg/dL (ref 0.60–0.88)
GFR, Est African American: 76 mL/min/{1.73_m2} (ref >=60)
GFR, Est Non African American: 66 mL/min/{1.73_m2} (ref >=60)
Globulin: 2.8 g/dL (calc) (ref 1.9–3.7)
Glucose, Bld: 109 mg/dL — ABNORMAL HIGH (ref 65–99)
Potassium: 4.3 mmol/L (ref 3.5–5.3)
Sodium: 145 mmol/L (ref 135–146)
Total Bilirubin: 0.4 mg/dL (ref 0.2–1.2)
Total Protein: 6.9 g/dL (ref 6.1–8.1)

## 2018-08-06 LAB — CBC WITH DIFFERENTIAL/PLATELET
Absolute Monocytes: 389 cells/uL (ref 200–950)
Basophils Absolute: 7 cells/uL (ref 0–200)
Basophils Relative: 0.1 %
Eosinophils Absolute: 58 cells/uL (ref 15–500)
Eosinophils Relative: 0.8 %
HCT: 35.7 % (ref 35.0–45.0)
Hemoglobin: 11.6 g/dL — ABNORMAL LOW (ref 11.7–15.5)
Lymphs Abs: 4032 cells/uL — ABNORMAL HIGH (ref 850–3900)
MCH: 31.1 pg (ref 27.0–33.0)
MCHC: 32.5 g/dL (ref 32.0–36.0)
MCV: 95.7 fL (ref 80.0–100.0)
MPV: 11.9 fL (ref 7.5–12.5)
Monocytes Relative: 5.4 %
Neutro Abs: 2714 cells/uL (ref 1500–7800)
Neutrophils Relative %: 37.7 %
Platelets: 260 10*3/uL (ref 140–400)
RBC: 3.73 10*6/uL — ABNORMAL LOW (ref 3.80–5.10)
RDW: 13.6 % (ref 11.0–15.0)
Total Lymphocyte: 56 %
WBC: 7.2 10*3/uL (ref 3.8–10.8)

## 2018-08-06 LAB — MAGNESIUM: Magnesium: 1.7 mg/dL (ref 1.5–2.5)

## 2018-08-06 NOTE — Progress Notes (Addendum)
Name: Beverly [name]   MRN: [id number]    DOB: 08/25/1934   Date:08/06/2018       Progress Note  Subjective  Chief Complaint  Chief Complaint  Patient presents with   Hospitalization Follow-up    passed out    HPI  Pt presents for hospital discharge follow up:   1. Syncope with collapse: Initial CT showedsubdural hematoma from fall. Repeat CT and MRI shows stable subdural hematoma; MRI also found meningioma. Patient was evaluated by neurosurgery and cardiology while admitted. Patient will need outpatient follow-up for her meningioma seen on CT scan. Patient has been off of blood thinners and NSAIDS for now until further follow-up with neurosurgery.  She has an appointment with Dr. Lacinda Axon on 08/21/2018.  - EKG essentially normal per hospitalist.  Echocardiogram was also unremarkable.  Carotid Doppler showed no evidence of hemodynamic significant stenosis. She is wearing ZIO monitor and we will refer to cardiology as it does not appear that a follow up appt is scheduled.  Denies chest pain, shortness of breath, lightheadedness, dizziness, or BLE edema.   She is advised that in [place] should refrain from driving for probably 6 weeks at least given the lack of prodrome and the lack of explanation  2. Subdural hematoma: She will need to discontinue aspirin and any NSAIDs which was discussed with the patient.  She will have outpatient neurosurgery follow-up for repeat CT scan - appointment on 08/21/2018    3. Mildly elevated troponin: These are flat and not associated with acute coronary syndrome. Attributed to demand ischemia from her fall. No events noted on telemetry per hospitalist note.  Wearing ZIO monitor with follow from Cardiologist at this time.  4. Diabetes: Continue ADA diet and metformin.  Fasting 103-106; Post prandial - 150-160's. Denies polyphagia, polydipsia, or polyuria.    5. Essential hypertension: Continue Norvasc and Avapro.  BP is controlled today on  her medications.  Is compliant with these medications today.   6. Lip Laceration: She has 2 sutures on the right lower lip that were placed while she was hospitalized and need to be removed today.  She notes still having soreness to the lip with significant scabbing.  Labs and imaging are reviewed: She did have some mild stable anemia, mild hypomagnesemia, however potassium levels and kidney function were normal.  TSH normal.  A1C is UTD and was well controlled in June 2020.  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, without long-term current use of insulin ([place]) 02/27/2018   Type 2 diabetes mellitus with pressure callus ([place]) 10/24/2017   Diabetic retinopathy ([place]) 11/12/2015   Obesity (BMI 30.0-34.9) 06/02/2015   Osteoarthritis 06/02/2015   Osteopenia 06/02/2015   Narrowing of intervertebral disc space 06/02/2015   Dyslipidemia associated with type 2 diabetes mellitus ([place]) 06/02/2015   Perennial allergic rhinitis 02/28/2007   Benign essential HTN 06/26/2006   Pure hypercholesterolemia 06/26/2006    Past Surgical History:  Procedure Laterality Date   ABDOMINAL HYSTERECTOMY      Family History  Problem Relation Age of Onset   Hypertension Mother    Healthy Father    Healthy Daughter    Raynaud syndrome Daughter    Hypertension Daughter    Stroke Sister     Social History   Socioeconomic History   Marital status: Widowed    Spouse  name: [name]   Number of children: 2   Years of education: Not on file   Highest education level: Bachelor's degree (e.g., BA, AB, BS)  Occupational History   Occupation: Retired  Scientist, product/process development strain: Not hard at [employer]    Worry: Never true    Inability: Never true   Transportation needs    Medical: No    Non-medical: No    Tobacco Use   Smoking status: Never Smoker   Smokeless tobacco: Never Used   Tobacco comment: smoking cessation materials not required  Substance and Sexual Activity   Alcohol use: No    Alcohol/week: 0.0 standard drinks   Drug use: No   Sexual activity: Not Currently  Lifestyle   Physical activity    Days per week: 3 days    Minutes per session: 40 min   Stress: Not at all  Relationships   Social connections    Talks on phone: Patient refused    Gets together: Patient refused    Attends religious service: Patient refused    Active member of club or organization: Patient refused    Attends meetings of clubs or organizations: Patient refused    Relationship status: Widowed   Intimate partner violence    Fear of current or ex partner: No    Emotionally abused: No    Physically abused: No    Forced sexual activity: No  Other Topics Concern   Not on file  Social History Narrative   Not on file     Current Outpatient Medications:    alendronate (FOSAMAX) 35 MG tablet, TAKE 1 TABLET BY MOUTH WEEKLY WITH A FULL GLASS OF WATER ON AN EMPTY STOMACH., Disp: 12 tablet, Rfl: 1   atorvastatin (LIPITOR) 40 MG tablet, Take 1 tablet (40 mg total) by mouth daily., Disp: 90 tablet, Rfl: 1   Cholecalciferol (VITAMIN D) 2000 units CAPS, Take 1 capsule (2,000 Units total) by mouth daily., Disp: 30 capsule, Rfl: 0   ferrous sulfate 325 (65 FE) MG tablet, Take 325 mg by mouth 2 (two) times daily with a meal. , Disp: , Rfl:    furosemide (LASIX) 40 MG tablet, Take 1 tablet (40 mg total) by mouth daily as needed., Disp: 90 tablet, Rfl: 0   Glucosamine Sulfate 500 MG TABS, Take 1 tablet by mouth once a week. , Disp: , Rfl:    loratadine (CLARITIN) 10 MG tablet, Take 1 tablet (10 mg total) by mouth daily., Disp: 90 tablet, Rfl: 1   metFORMIN (GLUCOPHAGE) 1000 MG tablet, Take 1 tablet (1,000 mg total) by mouth 2 (two) times daily with a meal., Disp: 180 tablet, Rfl: 0    montelukast (SINGULAIR) 10 MG tablet, Take 1 tablet (10 mg total) by mouth at bedtime., Disp: 90 tablet, Rfl: 1   Multiple Vitamin (MULTI-VITAMINS) TABS, Take by mouth., Disp: , Rfl:    Potassium Chloride ER 20 MEQ TBCR, Take 10 mEq by mouth daily as needed. With lasix, Disp: , Rfl:    repaglinide (PRANDIN) 2 MG tablet, Take 1 tablet (2 mg total) by mouth 3 (three) times daily., Disp: 270 tablet, Rfl: 1   telmisartan (MICARDIS) 40 MG tablet, Take 1 tablet (40 mg total) by mouth daily., Disp: 30 tablet, Rfl: 0   vitamin C (ASCORBIC ACID) 500 MG tablet, Take 500 mg by mouth daily., Disp: , Rfl:    Zinc Acetate 50 MG CAPS, Take by mouth daily. , Disp: , Rfl:  Allergies  Allergen Reactions   Lisinopril     I personally reviewed active problem list, medication list, allergies, notes from last encounter, lab results with the patient/caregiver today.   ROS  Constitutional: Negative for fever or weight change.  Respiratory: Negative for cough and shortness of breath.   Cardiovascular: Negative for chest pain or palpitations.  Gastrointestinal: Negative for abdominal pain, no bowel changes.  Musculoskeletal: Negative for gait problem or joint swelling.  Skin: Negative for rash.  Neurological: Negative for dizziness or headache.  No other specific complaints in a complete review of systems (except as listed in HPI above).  Objective  Vitals:   08/06/18 0910  BP: 112/62  Pulse: 99  Resp: 14  Temp: (!) 96.8 F (36 C)  SpO2: 95%  Weight: 195 lb 14.4 oz (88.9 kg)  Height: 5\' 5"  (1.651 m)    Body mass index is 32.6 kg/m.  Physical Exam  Constitutional: Patient appears well-developed and well-nourished. No distress.  HENT: Head: Normocephalic and atraumatic. Nose: Nose normal. Mouth/Throat: Oropharynx is clear and moist. No oropharyngeal exudate or tonsillar swelling.  Eyes: Conjunctivae and EOM are normal. No scleral icterus.  Pupils are equal, round, and reactive to light.    Neck: Normal range of motion. Neck supple. No JVD present. No thyromegaly present.  Cardiovascular: Normal rate, regular rhythm and normal heart sounds.  No murmur heard. No BLE edema. Pulmonary/Chest: Effort normal and breath sounds normal. No respiratory distress. Musculoskeletal: Normal range of motion, no joint effusions. No gross deformities Neurological: Pt is alert and oriented to person, place, and time. No cranial nerve deficit. Coordination, balance, strength, speech and gait are normal.  Skin: Skin is warm and dry. No rash noted. No erythema. There is a large scab to the right lower lip; 2 sutures are also present, saline soaked gauze is applied which successfully loosens the scabbing; 2 sutures are removed without bleeding; there is no erythema or purulent drainage. Psychiatric: Patient has a normal mood and affect. behavior is normal. Judgment and thought content normal.  No results found for this or any previous visit (from the past 72 hour(s)).   PHQ2/9: Depression screen [facility] 2/9 08/06/2018 07/02/2018 02/27/2018 11/20/2017 10/24/2017  Decreased Interest 0 1 0 1 2  Down, Depressed, Hopeless 0 0 0 0 0  PHQ - 2 Score 0 1 0 1 2  Altered sleeping 0 0 0 0 1  Tired, decreased energy 0 0 0 0 1  Change in appetite 0 0 0 0 0  Feeling bad or failure about yourself  0 0 0 0 0  Trouble concentrating 0 0 0 0 0  Moving slowly or fidgety/restless 0 0 0 0 0  Suicidal thoughts 0 0 0 0 0  PHQ-9 Score 0 1 0 1 4  Difficult doing work/chores - Not difficult at all Not difficult at all Not difficult at all Not difficult at all   PHQ-2/9 Result is negative.    Fall Risk: Fall Risk  08/06/2018 07/02/2018 02/27/2018 11/20/2017 10/24/2017  Falls in the past year? 1 0 0 0 No  Number falls in past yr: 0 0 - - -  Injury with Fall? 0 0 - - -  Risk for fall due to : - - - - -  Risk for fall due to: Comment - - - - -     Functional Status Survey: Is the patient deaf or have difficulty hearing?:  No Does the patient have difficulty seeing, even when wearing glasses/contacts?:  No Does the patient have difficulty concentrating, remembering, or making decisions?: No Does the patient have difficulty walking or climbing stairs?: No Does the patient have difficulty dressing or bathing?: No Does the patient have difficulty doing errands alone such as visiting a doctor's office or shopping?: No  Assessment & Plan  1. Syncope, unspecified syncope type - She has not bee driving; has upcoming neurosurgery appt to discuss abnormal imaging including subdural hematoma and meningioma.  She is wearing ZIO monitor with cardiology follow up - did place referral as I do not see a scheduled follow up appt and want to ensure continuity of care for the patient. - CBC with Differential/Platelet - COMPLETE METABOLIC PANEL WITH GFR - Ambulatory referral to Cardiology - Magnesium  2. Subdural hematoma (HCC) - Avoiding NSAIDS, ASA, blood thinners at this time - She has not bee driving; has upcoming neurosurgery appt to discuss abnormal imaging including subdural hematoma and meningioma.   3. 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  4. Benign essential HTN - BP is at goal today, continue current regimen - Ambulatory referral to Cardiology  5. Lip laceration, subsequent encounter - 2 sutures removed from the RIGHT lip.  Difficult to remove as it has been about 12 days since these were placed.  No bleeding or signs of infection are noted.    6. Visit for suture removal - 2 sutures removed from the RIGHT lip.  Difficult to remove as it has been about 12 days since these were placed.  No bleeding or signs of infection are noted.

## 2018-08-07 NOTE — Telephone Encounter (Signed)
Pt's procedure has bene reschedule to 09/24/2018 per pt request. Pt verbalized understanding

## 2018-08-08 ENCOUNTER — Telehealth: Payer: Self-pay | Admitting: Emergency Medicine

## 2018-08-08 DIAGNOSIS — R55 Syncope and collapse: Secondary | ICD-10-CM

## 2018-08-08 NOTE — Telephone Encounter (Signed)
She wants to go to [facility]

## 2018-08-08 NOTE — Telephone Encounter (Signed)
Would like referral to [facility] about the black out that made her Fall. Patient stated that she want a second opinion. Please put in referral if this is ok

## 2018-08-08 NOTE — Telephone Encounter (Signed)
She has appointment with Dr. Lacinda Axon on 08/21/2018 - does she want another opinion still, or does she want to wait and talk to him first?

## 2018-08-08 NOTE — Telephone Encounter (Signed)
That's fine - just have latisha change the referral to a [employer].  If a new referral needs to be placed, please pend. Thanks.

## 2018-08-09 ENCOUNTER — Other Ambulatory Visit: Admission: RE | Admit: 2018-08-09 | Payer: Medicare Other | Source: Ambulatory Visit

## 2018-08-10 NOTE — Telephone Encounter (Signed)
Pt calling to see the status of the referral to [facility]. Please call pt

## 2018-08-13 NOTE — Addendum Note (Signed)
Addended by: Marland Kitchen A on: 08/13/2018 09:09 AM   Modules accepted: Orders

## 2018-08-13 NOTE — Telephone Encounter (Signed)
Referral has been placed. 

## 2018-08-14 ENCOUNTER — Telehealth: Payer: Self-pay | Admitting: Internal Medicine

## 2018-08-14 NOTE — Telephone Encounter (Signed)
Returned call to [employer] with home health. She wanted to verify how many days pt will wear patch, when and how to remove.   I gave her all information and answered all questions. 14th day is this Thursday 8/13.   Advised RN to call for any further questions or concerns.

## 2018-08-14 NOTE — Telephone Encounter (Signed)
Home health nurse calling in to be advised on when to remove heart monitor. Nurse says the monitor was placed on 7/30. Please advise

## 2018-08-17 ENCOUNTER — Other Ambulatory Visit: Payer: Self-pay | Admitting: Family Medicine

## 2018-08-21 ENCOUNTER — Other Ambulatory Visit: Payer: Self-pay | Admitting: Family Medicine

## 2018-08-21 NOTE — Telephone Encounter (Signed)
Requested Prescriptions  Pending Prescriptions Disp Refills  . telmisartan (MICARDIS) 40 MG tablet [Pharmacy Med Name: TELMISARTAN 40 MG TABLET] 90 tablet 0    Sig: TAKE 1 TABLET BY MOUTH EVERY DAY     Cardiovascular:  Angiotensin Receptor Blockers Passed - 08/21/2018  3:55 PM      Passed - Cr in normal range and within 180 days    Creat  Date Value Ref Range Status  08/06/2018 0.83 0.60 - 0.88 mg/dL Final    Comment:    For patients >82 years of age, the reference limit for Creatinine is approximately 13% higher for people identified as African-American. .          Passed - K in normal range and within 180 days    Potassium  Date Value Ref Range Status  08/06/2018 4.3 3.5 - 5.3 mmol/L Final         Passed - Patient is not pregnant      Passed - Last BP in normal range    BP Readings from Last 1 Encounters:  08/06/18 112/62         Passed - Valid encounter within last 6 months    Recent Outpatient Visits          2 weeks ago Syncope, unspecified syncope type   [place], Astrid Divine, FNP   1 month ago Type 2 diabetes mellitus with pressure callus [facility])   [facility], Drue Stager, MD   5 months ago Type 2 diabetes mellitus with pressure callus [facility])   [facility] Steele Sizer, MD   9 months ago Acute cystitis with hematuria   [place], FNP   10 months ago 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

## 2018-09-04 ENCOUNTER — Other Ambulatory Visit: Payer: Self-pay

## 2018-09-05 ENCOUNTER — Ambulatory Visit: Payer: Medicare Other | Admitting: Family Medicine

## 2018-09-05 ENCOUNTER — Other Ambulatory Visit: Payer: Self-pay

## 2018-09-05 ENCOUNTER — Encounter: Payer: Self-pay | Admitting: Family Medicine

## 2018-09-05 VITALS — BP 132/64 | HR 102 | Temp 97.5°F | Resp 16 | Ht 65.0 in | Wt 193.4 lb

## 2018-09-05 DIAGNOSIS — Z23 Encounter for immunization: Secondary | ICD-10-CM

## 2018-09-05 DIAGNOSIS — L905 Scar conditions and fibrosis of skin: Secondary | ICD-10-CM

## 2018-09-05 MED ORDER — DIMETHICONE 2 % EX CREA: 1.0000 | TOPICAL_CREAM | Freq: Two times a day (BID) | CUTANEOUS | 0 refills | Status: DC | PRN

## 2018-09-05 NOTE — Progress Notes (Signed)
Name: REMII [name]   MRN: [id number]    DOB: 1934-08-22   Date:09/05/2018       Progress Note  Subjective  Chief Complaint  Chief Complaint  Patient presents with  . Loss of Consciousness    July 24, 2018, fell on concrete and has lacrations on her lips   . lacerations    on her lips Neurologist wanted PCP to evaulate patient and see if she needs to go to the [facility]     HPI  Lower lip scar: from fall on July 21 , 2020. She had two stiches on lower lip but continues to have an area of induration, no pain, wound is closed but concerned because it is not healing properly, lips is twisted on right side.   S/p syncope; no episodes since her initial fall, seeing Dr. Lacinda [name] since   Patient Active Problem List   Diagnosis Date Noted  . Subdural hematoma ([place])   . Syncope and collapse 07/24/2018  . 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 Type 2 diabetes mellitus with pressure callus ([place]) 10/24/2017  . Diabetic retinopathy ([place]) 11/12/2015  . Obesity (BMI 30.0-34.9) 06/02/2015  . Osteoarthritis 06/02/2015  . Osteopenia 06/02/2015  . Narrowing of intervertebral disc space 06/02/2015  . Dyslipidemia associated with type 2 diabetes mellitus ([place]) 06/02/2015  . Perennial allergic rhinitis 02/28/2007  . Benign essential HTN 06/26/2006  . Pure hypercholesterolemia 06/26/2006    Past Surgical History:  Procedure Laterality Date  . ABDOMINAL HYSTERECTOMY      Family History  Problem Relation Age of Onset  . Hypertension Mother   . Healthy Father   . Healthy Daughter   . Raynaud syndrome Daughter   . Hypertension Daughter   . Stroke Sister     Social History   Socioeconomic History  . Marital status: Widowed    Spouse name: Beverly [name]  . Number of children: 2  . Years of education: Not on file  . Highest education level: Bachelor's degree (e.g., BA, AB, BS)  Occupational History   . Occupation: Retired  Scientific laboratory technician  . Financial resource strain: Not hard at all  . Food insecurity    Worry: Never true    Inability: Never true  . Transportation needs    Medical: No    Non-medical: No  Tobacco Use  . Smoking status: Never Smoker  . Smokeless tobacco: Never Used  . Tobacco comment: smoking cessation materials not required  Substance and Sexual Activity  . Alcohol use: No    Alcohol/week: 0.0 standard drinks  . Drug use: No  . Sexual activity: Not Currently  Lifestyle  . Physical activity    Days per week: 3 days    Minutes per session: 40 min  . Stress: Not at all  Relationships  . Social Herbalist on phone: Patient refused    Gets together: Patient refused    Attends religious service: Patient refused    Active member of club or organization: Patient refused    Attends meetings of clubs or organizations: Patient refused    Relationship status: Widowed  . Intimate partner violence    Fear of current or ex partner: No    Emotionally abused: No    Physically abused: No    Forced sexual activity: No  Other Topics Concern  . Not on file  Social History Narrative  . Not on file  Current Outpatient Medications:  .  alendronate (FOSAMAX) 35 MG tablet, TAKE 1 TABLET BY MOUTH WEEKLY WITH A FULL GLASS OF WATER ON AN EMPTY STOMACH., Disp: 12 tablet, Rfl: 1 .  atorvastatin (LIPITOR) 40 MG tablet, Take 1 tablet (40 mg total) by mouth daily., Disp: 90 tablet, Rfl: 1 .  ferrous sulfate 325 (65 FE) MG tablet, Take 325 mg by mouth 2 (two) times daily with a meal. , Disp: , Rfl:  .  furosemide (LASIX) 40 MG tablet, Take 1 tablet (40 mg total) by mouth daily as needed., Disp: 90 tablet, Rfl: 0 .  Glucosamine Sulfate 500 MG TABS, Take 1 tablet by mouth once a week. , Disp: , Rfl:  .  loratadine (CLARITIN) 10 MG tablet, Take 1 tablet (10 mg total) by mouth daily., Disp: 90 tablet, Rfl: 1 .  losartan (COZAAR) 25 MG tablet, TAKE 2 TABLETS (50 MG) BY  MOUTH EVERY DAY 50 MG TABS ON BACKORDER, Disp: , Rfl:  .  metFORMIN (GLUCOPHAGE) 1000 MG tablet, Take 1 tablet (1,000 mg total) by mouth 2 (two) times daily with a meal., Disp: 180 tablet, Rfl: 0 .  montelukast (SINGULAIR) 10 MG tablet, Take 1 tablet (10 mg total) by mouth at bedtime., Disp: 90 tablet, Rfl: 1 .  Multiple Vitamin (MULTI-VITAMINS) TABS, Take by mouth., Disp: , Rfl:  .  Potassium Chloride ER 20 MEQ TBCR, Take 10 mEq by mouth daily as needed. With lasix, Disp: , Rfl:  .  repaglinide (PRANDIN) 2 MG tablet, Take 1 tablet (2 mg total) by mouth 3 (three) times daily., Disp: 270 tablet, Rfl: 1 .  vitamin C (ASCORBIC ACID) 500 MG tablet, Take 500 mg by mouth daily., Disp: , Rfl:  .  Zinc Acetate 50 MG CAPS, Take by mouth daily. , Disp: , Rfl:  .  Cholecalciferol (VITAMIN D) 2000 units CAPS, Take 1 capsule (2,000 Units total) by mouth daily. (Patient not taking: Reported on 09/05/2018), Disp: 30 capsule, Rfl: 0 .  telmisartan (MICARDIS) 40 MG tablet, TAKE 1 TABLET BY MOUTH EVERY DAY (Patient not taking: Reported on 09/05/2018), Disp: 90 tablet, Rfl: 0  Allergies  Allergen Reactions  . Lisinopril     I personally reviewed active problem list, medication list, allergies, family history, social history with the patient/caregiver today.   ROS  Ten systems reviewed and is negative except as mentioned in HPI   Objective  Vitals:   09/05/18 1116  BP: 132/64  Pulse: (!) 102  Resp: 16  Temp: (!) 97.5 F (36.4 C)  TempSrc: Temporal  SpO2: 97%  Weight: 193 lb 6.4 oz (87.7 kg)  Height: 5\' 5"  (1.651 m)    Body mass index is 32.18 kg/m.  Physical Exam  Constitutional: Patient appears well-developed and well-nourished. Obese  HEENT: head atraumatic, normocephalic, pupils equal and reactive to light Cardiovascular: Normal rate, regular rhythm and normal heart sounds.  No murmur heard. No BLE edema. Pulmonary/Chest: Effort normal and breath sounds normal. No respiratory  distress. Skin: Lower lip indurated , non tender , mild keloid formation  Abdominal: Soft.  There is no tenderness. Psychiatric: Patient has a normal mood and affect. behavior is normal. Judgment and thought content normal.  Recent Results (from the past 2160 hour(s))  POCT HgB A1C     Status: Normal   Collection Time: 07/02/18 11:17 AM  Result Value Ref Range   Hemoglobin A1C     HbA1c POC (<> result, manual entry)     HbA1c, POC (prediabetic range)  HbA1c, POC (controlled diabetic range) 6.6 0.0 - 7.0 %  CBC with Differential/Platelet     Status: Abnormal   Collection Time: 07/24/18  1:26 PM  Result Value Ref Range   WBC 9.7 4.0 - 10.5 K/uL   RBC 3.59 (L) 3.87 - 5.11 MIL/uL   Hemoglobin 11.2 (L) 12.0 - 15.0 g/dL   HCT 35.2 (L) 36.0 - 46.0 %   MCV 98.1 80.0 - 100.0 fL   MCH 31.2 26.0 - 34.0 pg   MCHC 31.8 30.0 - 36.0 g/dL   RDW 15.1 11.5 - 15.5 %   Platelets 204 150 - 400 K/uL   nRBC 0.0 0.0 - 0.2 %   Neutrophils Relative % 39 %   Neutro Abs 3.7 1.7 - 7.7 K/uL   Lymphocytes Relative 55 %   Lymphs Abs 5.3 (H) 0.7 - 4.0 K/uL   Monocytes Relative 5 %   Monocytes Absolute 0.5 0.1 - 1.0 K/uL   Eosinophils Relative 1 %   Eosinophils Absolute 0.1 0.0 - 0.5 K/uL   Basophils Relative 0 %   Basophils Absolute 0.0 0.0 - 0.1 K/uL   WBC Morphology MORPHOLOGY UNREMARKABLE    RBC Morphology MORPHOLOGY UNREMARKABLE    Smear Review Normal platelet morphology    Immature Granulocytes 0 %   Abs Immature Granulocytes 0.02 0.00 - 0.07 K/uL    Comment: Performed at [facility], [address] [place], [place] [address]  Comprehensive metabolic panel     Status: Abnormal   Collection Time: 07/24/18  1:26 PM  Result Value Ref Range   Sodium 141 135 - 145 mmol/L   Potassium 4.2 3.5 - 5.1 mmol/L   Chloride 106 98 - 111 mmol/L   CO2 21 (L) 22 - 32 mmol/L   Glucose, Bld 142 (H) 70 - 99 mg/dL   BUN 17 8 - 23 mg/dL   Creatinine, Ser 0.84 0.44 - 1.00 mg/dL   Calcium 9.1 8.9 -  10.3 mg/dL   Total Protein 6.7 6.5 - 8.1 g/dL   Albumin 3.6 3.5 - 5.0 g/dL   AST 25 15 - 41 U/L   ALT 12 0 - 44 U/L   Alkaline Phosphatase 47 38 - 126 U/L   Total Bilirubin 0.7 0.3 - 1.2 mg/dL   GFR calc non Af Amer >60 >60 mL/min   GFR calc Af Amer >60 >60 mL/min   Anion gap 14 5 - 15    Comment: Performed at [facility], [place], [place] [address]  Troponin I (High Sensitivity)     Status: Abnormal   Collection Time: 07/24/18  1:26 PM  Result Value Ref Range   Troponin I (High Sensitivity) 25 (H) <18 ng/L    Comment: (NOTE) Elevated high sensitivity troponin I (hsTnI) values and significant  changes across serial measurements may suggest ACS but many other  chronic and acute conditions are known to elevate hsTnI results.  Refer to the "Links" section for chest pain algorithms and additional  guidance. Performed at [facility], [address] [place], [place] [address]   Urinalysis, Complete w Microscopic     Status: Abnormal   Collection Time: 07/24/18  3:09 PM  Result Value Ref Range   Color, Urine YELLOW (A) YELLOW   APPearance CLEAR (A) CLEAR   Specific Gravity, Urine 1.018 1.005 - 1.030   pH 5.0 5.0 - 8.0   Glucose, UA NEGATIVE NEGATIVE mg/dL   Hgb urine dipstick NEGATIVE NEGATIVE   Bilirubin  Urine NEGATIVE NEGATIVE   Ketones, ur NEGATIVE NEGATIVE mg/dL   Protein, ur 30 (A) NEGATIVE mg/dL   Nitrite NEGATIVE NEGATIVE   Leukocytes,Ua MODERATE (A) NEGATIVE   RBC / HPF 0-5 0 - 5 RBC/hpf   WBC, UA 11-20 0 - 5 WBC/hpf   Bacteria, UA NONE SEEN NONE SEEN   Squamous Epithelial / LPF 0-5 0 - 5   Mucus PRESENT    Hyaline Casts, UA PRESENT     Comment: Performed at [facility], [address], [place], [place] [address]  SARS Coronavirus 2 (CEPHEID- Performed in [place] hospital lab), Hosp Order     Status: None   Collection Time: 07/24/18  3:09 PM   Specimen: Nasopharyngeal  Result Value Ref Range   SARS Coronavirus 2  NEGATIVE NEGATIVE    Comment: (NOTE) If result is NEGATIVE SARS-CoV-2 target nucleic acids are NOT DETECTED. The SARS-CoV-2 RNA is generally detectable in upper and lower  respiratory specimens during the acute phase of infection. The lowest  concentration of SARS-CoV-2 viral copies this assay can detect is 250  copies / mL. A negative result does not preclude SARS-CoV-2 infection  and should not be used as the sole basis for treatment or other  patient management decisions.  A negative result may occur with  improper specimen collection / handling, submission of specimen other  than nasopharyngeal swab, presence of viral mutation(s) within the  areas targeted by this assay, and inadequate number of viral copies  (<250 copies / mL). A negative result must be combined with clinical  observations, patient history, and epidemiological information. If result is POSITIVE SARS-CoV-2 target nucleic acids are DETECTED. The SARS-CoV-2 RNA is generally detectable in upper and lower  respiratory specimens dur ing the acute phase of infection.  Positive  results are indicative of active infection with SARS-CoV-2.  Clinical  correlation with patient history and other diagnostic information is  necessary to determine patient infection status.  Positive results do  not rule out bacterial infection or co-infection with other viruses. If result is PRESUMPTIVE POSTIVE SARS-CoV-2 nucleic acids MAY BE PRESENT.   A presumptive positive result was obtained on the submitted specimen  and confirmed on repeat testing.  While 2019 novel coronavirus  (SARS-CoV-2) nucleic acids may be present in the submitted sample  additional confirmatory testing may be necessary for epidemiological  and / or clinical management purposes  to differentiate between  SARS-CoV-2 and other Sarbecovirus currently known to infect humans.  If clinically indicated additional testing with an alternate test  methodology [phone]) is  advised. The SARS-CoV-2 RNA is generally  detectable in upper and lower respiratory sp ecimens during the acute  phase of infection. The expected result is Negative. Fact Sheet for Patients:  StrictlyIdeas.no Fact Sheet for Healthcare Providers: BankingDealers.co.za This test is not yet approved or cleared by the [place] FDA and has been authorized for detection and/or diagnosis of SARS-CoV-2 by FDA under an Emergency Use Authorization (EUA).  This EUA will remain in effect (meaning this test can be used) for the duration of the COVID-19 declaration under Section 564(b)(1) of the Act, 21 U.S.C. section 360bbb-3(b)(1), unless the authorization is terminated or revoked sooner. Performed at [facility], [address] [place], [place] [address]   Troponin I (High Sensitivity)     Status: Abnormal   Collection Time: 07/24/18  3:09 PM  Result Value Ref Range   Troponin I (High Sensitivity) 32 (H) <18 ng/L    Comment: (  NOTE) Elevated high sensitivity troponin I (hsTnI) values and significant  changes across serial measurements may suggest ACS but many other  chronic and acute conditions are known to elevate hsTnI results.  Refer to the "Links" section for chest pain algorithms and additional  guidance. Performed at [facility], [address] [place], [place] [address]   TSH     Status: None   Collection Time: 07/24/18 11:00 PM  Result Value Ref Range   TSH 1.097 0.350 - 4.500 uIU/mL    Comment: Performed by a 3rd Generation assay with a functional sensitivity of <=0.01 uIU/mL. Performed at [facility], [place], [place] [address]   Troponin I (High Sensitivity)     Status: Abnormal   Collection Time: 07/24/18 11:00 PM  Result Value Ref Range   Troponin I (High Sensitivity) 40 (H) <18 ng/L    Comment: (NOTE) Elevated high sensitivity troponin I (hsTnI) values and significant   changes across serial measurements may suggest ACS but many other  chronic and acute conditions are known to elevate hsTnI results.  Refer to the "Links" section for chest pain algorithms and additional  guidance. Performed at [facility], [place], [place] [address]   Glucose, capillary     Status: Abnormal   Collection Time: 07/24/18 11:00 PM  Result Value Ref Range   Glucose-Capillary 108 (H) 70 - 99 mg/dL  Troponin I (High Sensitivity)     Status: Abnormal   Collection Time: 07/25/18 12:48 AM  Result Value Ref Range   Troponin I (High Sensitivity) 37 (H) <18 ng/L    Comment: (NOTE) Elevated high sensitivity troponin I (hsTnI) values and significant  changes across serial measurements may suggest ACS but many other  chronic and acute conditions are known to elevate hsTnI results.  Refer to the "Links" section for chest pain algorithms and additional  guidance. Performed at [facility], [address] [place], [place] [id number]   Basic metabolic panel     Status: Abnormal   Collection Time: 07/25/18  4:37 AM  Result Value Ref Range   Sodium 141 135 - 145 mmol/L   Potassium 3.5 3.5 - 5.1 mmol/L   Chloride 107 98 - 111 mmol/L   CO2 26 22 - 32 mmol/L   Glucose, Bld 107 (H) 70 - 99 mg/dL   BUN 10 8 - 23 mg/dL   Creatinine, Ser 0.63 0.44 - 1.00 mg/dL   Calcium 8.6 (L) 8.9 - 10.3 mg/dL   GFR calc non Af Amer >60 >60 mL/min   GFR calc Af Amer >60 >60 mL/min   Anion gap 8 5 - 15    Comment: Performed at [facility], [address] [place], [place] [address]  CBC     Status: Abnormal   Collection Time: 07/25/18  4:37 AM  Result Value Ref Range   WBC 7.9 4.0 - 10.5 K/uL   RBC 3.57 (L) 3.87 - 5.11 MIL/uL   Hemoglobin 11.2 (L) 12.0 - 15.0 g/dL   HCT 34.7 (L) 36.0 - 46.0 %   MCV 97.2 80.0 - 100.0 fL   MCH 31.4 26.0 - 34.0 pg   MCHC 32.3 30.0 - 36.0 g/dL   RDW 14.8 11.5 - 15.5 %   Platelets 189 150 - 400 K/uL   nRBC 0.0 0.0 -  0.2 %    Comment: Performed at [facility], [address] [place], [place] [address]  Protime-INR  Status: Abnormal   Collection Time: 07/25/18  4:37 AM  Result Value Ref Range   Prothrombin Time 15.9 (H) 11.4 - 15.2 seconds   INR 1.3 (H) 0.8 - 1.2    Comment: (NOTE) INR goal varies based on device and disease states. Performed at [facility], [address] [place], [place] [address]   TSH     Status: None   Collection Time: 07/25/18  4:37 AM  Result Value Ref Range   TSH 1.095 0.350 - 4.500 uIU/mL    Comment: Performed by a 3rd Generation assay with a functional sensitivity of <=0.01 uIU/mL. Performed at [facility], [address] [place], [place] [address]   Glucose, capillary     Status: Abnormal   Collection Time: 07/25/18  7:38 AM  Result Value Ref Range   Glucose-Capillary 103 (H) 70 - 99 mg/dL  ECHOCARDIOGRAM COMPLETE     Status: None   Collection Time: 07/25/18 10:03 AM  Result Value Ref Range   Weight 3,200 oz   Height 65 in   BP 160/87 mmHg  Glucose, capillary     Status: Abnormal   Collection Time: 07/25/18 12:13 PM  Result Value Ref Range   Glucose-Capillary 133 (H) 70 - 99 mg/dL  Magnesium     Status: Abnormal   Collection Time: 07/25/18  1:14 PM  Result Value Ref Range   Magnesium 1.5 (L) 1.7 - 2.4 mg/dL    Comment: Performed at [facility], [address] [place], [place] [address]  Glucose, capillary     Status: Abnormal   Collection Time: 07/25/18  4:56 PM  Result Value Ref Range   Glucose-Capillary 103 (H) 70 - 99 mg/dL   Comment 1 Notify RN   Glucose, capillary     Status: Abnormal   Collection Time: 07/25/18  8:34 PM  Result Value Ref Range   Glucose-Capillary 127 (H) 70 - 99 mg/dL  Glucose, capillary     Status: Abnormal   Collection Time: 07/26/18  7:44 AM  Result Value Ref Range   Glucose-Capillary 108 (H) 70 - 99 mg/dL  Glucose, capillary     Status: Abnormal   Collection Time: 07/26/18  12:04 PM  Result Value Ref Range   Glucose-Capillary 127 (H) 70 - 99 mg/dL  CBC with Differential/Platelet     Status: Abnormal   Collection Time: 08/06/18 10:49 AM  Result Value Ref Range   WBC 7.2 3.8 - 10.8 Thousand/uL   RBC 3.73 (L) 3.80 - 5.10 Million/uL   Hemoglobin 11.6 (L) 11.7 - 15.5 g/dL   HCT 35.7 35.0 - 45.0 %   MCV 95.7 80.0 - 100.0 fL   MCH 31.1 27.0 - 33.0 pg   MCHC 32.5 32.0 - 36.0 g/dL   RDW 13.6 11.0 - 15.0 %   Platelets 260 140 - 400 Thousand/uL   MPV 11.9 7.5 - 12.5 fL   Neutro Abs 2,714 1,500 - 7,800 cells/uL   Lymphs Abs 4,032 (H) 850 - 3,900 cells/uL   Absolute Monocytes 389 200 - 950 cells/uL   Eosinophils Absolute 58 15 - 500 cells/uL   Basophils Absolute 7 0 - 200 cells/uL   Neutrophils Relative % 37.7 %   Total Lymphocyte 56.0 %   Monocytes Relative 5.4 %   Eosinophils Relative 0.8 %   Basophils Relative 0.1 %  COMPLETE METABOLIC PANEL WITH GFR     Status: Abnormal   Collection Time: 08/06/18 10:49 AM  Result Value Ref Range  Glucose, Bld 109 (H) 65 - 99 mg/dL    Comment: .            Fasting reference interval . For someone without known diabetes, a glucose value between 100 and 125 mg/dL is consistent with prediabetes and should be confirmed with a follow-up test. .    BUN 12 7 - 25 mg/dL   Creat 0.83 0.60 - 0.88 mg/dL    Comment: For patients >37 years of age, the reference limit for Creatinine is approximately 13% higher for people identified as African-American. .    GFR, Est Non African American 66 > OR = 60 mL/min/1.41m2   GFR, Est African American 76 > OR = 60 mL/min/1.24m2   BUN/Creatinine Ratio NOT APPLICABLE 6 - 22 (calc)   Sodium 145 135 - 146 mmol/L   Potassium 4.3 3.5 - 5.3 mmol/L   Chloride 106 98 - 110 mmol/L   CO2 28 20 - 32 mmol/L   Calcium 9.8 8.6 - 10.4 mg/dL   Total Protein 6.9 6.1 - 8.1 g/dL   Albumin 4.1 3.6 - 5.1 g/dL   Globulin 2.8 1.9 - 3.7 g/dL (calc)   AG Ratio 1.5 1.0 - 2.5 (calc)   Total Bilirubin 0.4  0.2 - 1.2 mg/dL   Alkaline phosphatase (APISO) 54 37 - 153 U/L   AST 15 10 - 35 U/L   ALT 5 (L) 6 - 29 U/L  Magnesium     Status: None   Collection Time: 08/06/18 10:49 AM  Result Value Ref Range   Magnesium 1.7 1.5 - 2.5 mg/dL    PHQ2/9: Depression screen [facility] 2/9 09/05/2018 08/06/2018 07/02/2018 02/27/2018 11/20/2017  Decreased Interest 0 0 1 0 1  Down, Depressed, Hopeless 0 0 0 0 0  PHQ - 2 Score 0 0 1 0 1  Altered sleeping 0 0 0 0 0  Tired, decreased energy 0 0 0 0 0  Change in appetite 0 0 0 0 0  Feeling bad or failure about yourself  0 0 0 0 0  Trouble concentrating 0 0 0 0 0  Moving slowly or fidgety/restless 0 0 0 0 0  Suicidal thoughts 0 0 0 0 0  PHQ-9 Score 0 0 1 0 1  Difficult doing work/chores - - Not difficult at all Not difficult at all Not difficult at all  Some recent data might be hidden    phq 9 is negative   Fall Risk: Fall Risk  09/05/2018 08/06/2018 07/02/2018 02/27/2018 11/20/2017  Falls in the past year? 1 1 0 0 0  Number falls in past yr: 0 0 0 - -  Injury with Fall? 1 0 0 - -  Risk for fall due to : Other (Comment) - - - -  Risk for fall due to: Comment Syncopy - - - -    Functional Status Survey: Is the patient deaf or have difficulty hearing?: No Does the patient have difficulty seeing, even when wearing glasses/contacts?: No Does the patient have difficulty concentrating, remembering, or making decisions?: No Does the patient have difficulty walking or climbing stairs?: No Does the patient have difficulty dressing or bathing?: No Does the patient have difficulty doing errands alone such as visiting a doctor's office or shopping?: No   Assessment & Plan  1. Scar of skin of lip  - DIMETHICONE, TOPICAL, 2 % CREA; Apply 1 each topically 2 (two) times daily as needed.  Dispense: 48 g; Refill: 0 Advised to massage area, we can refer  her to plastic surgeon but not sure if covered by insurance

## 2018-09-05 NOTE — Patient Instructions (Signed)
Vitamin E oil or mederma cream

## 2018-09-20 ENCOUNTER — Other Ambulatory Visit
Admission: RE | Admit: 2018-09-20 | Discharge: 2018-09-20 | Disposition: A | Discharge status: Home / Self Care | Payer: Medicare Other | Source: Ambulatory Visit | Attending: Gastroenterology | Admitting: Gastroenterology

## 2018-09-20 ENCOUNTER — Other Ambulatory Visit: Payer: Self-pay

## 2018-09-20 DIAGNOSIS — Z01812 Encounter for preprocedural laboratory examination: Secondary | ICD-10-CM | POA: Insufficient documentation

## 2018-09-20 DIAGNOSIS — Z20828 Contact with and (suspected) exposure to other viral communicable diseases: Secondary | ICD-10-CM | POA: Diagnosis present

## 2018-09-20 LAB — SARS CORONAVIRUS 2 (TAT 6-24 HRS): SARS Coronavirus 2: NEGATIVE

## 2018-09-23 ENCOUNTER — Encounter: Payer: Self-pay | Admitting: Anesthesiology

## 2018-09-24 ENCOUNTER — Encounter: Payer: Self-pay | Admitting: *Deleted

## 2018-09-24 ENCOUNTER — Ambulatory Visit: Payer: Medicare Other | Admitting: Anesthesiology

## 2018-09-24 ENCOUNTER — Encounter
Admission: RE | Disposition: A | Discharge status: Home / Self Care | Payer: Self-pay | Source: Home / Self Care | Attending: Gastroenterology

## 2018-09-24 ENCOUNTER — Ambulatory Visit
Admission: RE | Admit: 2018-09-24 | Discharge: 2018-09-24 | Disposition: A | Discharge status: Home / Self Care | Payer: Medicare Other | Attending: Gastroenterology | Admitting: Gastroenterology

## 2018-09-24 ENCOUNTER — Other Ambulatory Visit: Payer: Self-pay

## 2018-09-24 ENCOUNTER — Telehealth: Payer: Self-pay | Admitting: Gastroenterology

## 2018-09-24 DIAGNOSIS — I10 Essential (primary) hypertension: Secondary | ICD-10-CM | POA: Insufficient documentation

## 2018-09-24 DIAGNOSIS — K222 Esophageal obstruction: Secondary | ICD-10-CM | POA: Insufficient documentation

## 2018-09-24 DIAGNOSIS — K5730 Diverticulosis of large intestine without perforation or abscess without bleeding: Secondary | ICD-10-CM | POA: Insufficient documentation

## 2018-09-24 DIAGNOSIS — D124 Benign neoplasm of descending colon: Secondary | ICD-10-CM | POA: Insufficient documentation

## 2018-09-24 DIAGNOSIS — E785 Hyperlipidemia, unspecified: Secondary | ICD-10-CM | POA: Insufficient documentation

## 2018-09-24 DIAGNOSIS — K648 Other hemorrhoids: Secondary | ICD-10-CM | POA: Insufficient documentation

## 2018-09-24 DIAGNOSIS — Z79899 Other long term (current) drug therapy: Secondary | ICD-10-CM | POA: Diagnosis not present

## 2018-09-24 DIAGNOSIS — Z7983 Long term (current) use of bisphosphonates: Secondary | ICD-10-CM | POA: Diagnosis not present

## 2018-09-24 DIAGNOSIS — K319 Disease of stomach and duodenum, unspecified: Secondary | ICD-10-CM | POA: Diagnosis not present

## 2018-09-24 DIAGNOSIS — Z7984 Long term (current) use of oral hypoglycemic drugs: Secondary | ICD-10-CM | POA: Insufficient documentation

## 2018-09-24 DIAGNOSIS — K644 Residual hemorrhoidal skin tags: Secondary | ICD-10-CM | POA: Insufficient documentation

## 2018-09-24 DIAGNOSIS — E119 Type 2 diabetes mellitus without complications: Secondary | ICD-10-CM | POA: Insufficient documentation

## 2018-09-24 DIAGNOSIS — K210 Gastro-esophageal reflux disease with esophagitis: Secondary | ICD-10-CM | POA: Insufficient documentation

## 2018-09-24 DIAGNOSIS — D509 Iron deficiency anemia, unspecified: Secondary | ICD-10-CM | POA: Diagnosis present

## 2018-09-24 HISTORY — PX: ESOPHAGOGASTRODUODENOSCOPY (EGD) WITH PROPOFOL: SHX5813

## 2018-09-24 HISTORY — PX: COLONOSCOPY WITH PROPOFOL: SHX5780

## 2018-09-24 LAB — GLUCOSE, CAPILLARY: Glucose-Capillary: 138 mg/dL — ABNORMAL HIGH (ref 70–99)

## 2018-09-24 SURGERY — COLONOSCOPY WITH PROPOFOL
Anesthesia: General

## 2018-09-24 MED ORDER — PROPOFOL 10 MG/ML IV BOLUS
INTRAVENOUS | Status: AC
  Filled 2018-09-24: qty 40

## 2018-09-24 MED ORDER — LIDOCAINE HCL (CARDIAC) PF 100 MG/5ML IV SOSY
PREFILLED_SYRINGE | INTRAVENOUS | Status: DC | PRN
  Administered 2018-09-24: 50 mg via INTRAVENOUS

## 2018-09-24 MED ORDER — PROPOFOL 10 MG/ML IV BOLUS
INTRAVENOUS | Status: DC | PRN
  Administered 2018-09-24: 30 mg via INTRAVENOUS

## 2018-09-24 MED ORDER — SODIUM CHLORIDE 0.9 % IV SOLN
INTRAVENOUS | Status: DC
  Administered 2018-09-24: 09:00:00 via INTRAVENOUS

## 2018-09-24 MED ORDER — OMEPRAZOLE 20 MG PO CPDR: 20.0000 mg | DELAYED_RELEASE_CAPSULE | Freq: Two times a day (BID) | ORAL | 2 refills | Status: DC

## 2018-09-24 MED ORDER — PROPOFOL 500 MG/50ML IV EMUL
INTRAVENOUS | Status: DC | PRN
  Administered 2018-09-24: 150 ug/kg/min via INTRAVENOUS

## 2018-09-24 MED ORDER — PROPOFOL 10 MG/ML IV BOLUS
INTRAVENOUS | Status: AC
  Filled 2018-09-24: qty 20

## 2018-09-24 NOTE — Telephone Encounter (Signed)
-----   Message from Lin Landsman, MD sent at 09/24/2018 10:15 AM EDT ----- Regarding: follow up in 71month

## 2018-09-24 NOTE — Anesthesia Post-op Follow-up Note (Signed)
Anesthesia QCDR form completed.        

## 2018-09-24 NOTE — Anesthesia Postprocedure Evaluation (Signed)
Anesthesia Post Note  Patient: Beverly [name]  Procedure(s) Performed: COLONOSCOPY WITH PROPOFOL (N/A ) ESOPHAGOGASTRODUODENOSCOPY (EGD) WITH PROPOFOL (N/A )  Patient location during evaluation: Endoscopy Anesthesia Type: General Level of consciousness: awake and alert Pain management: pain level controlled Vital Signs Assessment: post-procedure vital signs reviewed and stable Respiratory status: spontaneous breathing, nonlabored ventilation, respiratory function stable and patient connected to nasal cannula oxygen Cardiovascular status: blood pressure returned to baseline and stable Postop Assessment: no apparent nausea or vomiting Anesthetic complications: no     Last Vitals:  Vitals:   09/24/18 1045 09/24/18 1103  BP: 122/72 130/70  Pulse:    Resp:  16  Temp:    SpO2:      Last Pain:  Vitals:   09/24/18 1103  TempSrc:   PainSc: 0-No pain                 Glema Takaki S

## 2018-09-24 NOTE — Transfer of Care (Signed)
Immediate Anesthesia Transfer of Care Note  Patient: Beverly [name]  Procedure(s) Performed: COLONOSCOPY WITH PROPOFOL (N/A ) ESOPHAGOGASTRODUODENOSCOPY (EGD) WITH PROPOFOL (N/A )  Patient Location: Endoscopy Unit  Anesthesia Type:General  Level of Consciousness: awake and alert   Airway & Oxygen Therapy: Patient Spontanous Breathing and Patient connected to nasal cannula oxygen  Post-op Assessment: Report given to RN and Post -op Vital signs reviewed and stable  Post vital signs: Reviewed and stable  Last Vitals:  Vitals Value Taken Time  BP    Temp    Pulse    Resp    SpO2      Last Pain:  Vitals:   09/24/18 0837  TempSrc: Tympanic  PainSc: 0-No pain         Complications: No apparent anesthesia complications

## 2018-09-24 NOTE — Op Note (Addendum)
[facility] Gastroenterology Patient Name: Beverly [name] Procedure Date: 09/24/2018 9:11 AM MRN: [id number] Account #: [id number] Date of Birth: 02/02/1934 Admit Type: Outpatient Age: 84 Room: [facility] ENDO ROOM 2 Gender: Female Note Status: Addendum Procedure:            Colonoscopy Indications:          Unexplained iron deficiency anemia Providers:            Lin Landsman MD, MD Referring MD:         Bethena Roys. Sowles, MD (Referring MD) Medicines:            Monitored Anesthesia Care Complications:        No immediate complications. Estimated blood loss: None. Procedure:            Pre-Anesthesia Assessment:                       - Prior to the procedure, a History and Physical was                        performed, and patient medications and allergies were                        reviewed. The patient is competent. The risks and                        benefits of the procedure and the sedation options and                        risks were discussed with the patient. All questions                        were answered and informed consent was obtained.                        Patient identification and proposed procedure were                        verified by the physician, the nurse, the                        anesthesiologist, the anesthetist and the technician in                        the pre-procedure area in the procedure room in the                        endoscopy suite. Mental Status Examination: alert and                        oriented. Airway Examination: normal oropharyngeal                        airway and neck mobility. Respiratory Examination:                        clear to auscultation. CV Examination: normal.                        Prophylactic Antibiotics: The patient does not require  prophylactic antibiotics. Prior Anticoagulants: The                        patient has taken no previous anticoagulant or                         antiplatelet agents. ASA Grade Assessment: III - A                        patient with severe systemic disease. After reviewing                        the risks and benefits, the patient was deemed in                        satisfactory condition to undergo the procedure. The                        anesthesia plan was to use monitored anesthesia care                        (MAC). Immediately prior to administration of                        medications, the patient was re-assessed for adequacy                        to receive sedatives. The heart rate, respiratory rate,                        oxygen saturations, blood pressure, adequacy of                        pulmonary ventilation, and response to care were                        monitored throughout the procedure. The physical status                        of the patient was re-assessed after the procedure.                       After obtaining informed consent, the colonoscope was                        passed under direct vision. Throughout the procedure,                        the patient's blood pressure, pulse, and oxygen                        saturations were monitored continuously. The                        Colonoscope was introduced through the anus and                        advanced to the the cecum, identified by appendiceal  orifice and ileocecal valve. The colonoscopy was                        extremely difficult due to multiple diverticula in the                        colon, significant looping and the patient's body                        habitus. Successful completion of the procedure was                        aided by changing the patient to a prone position,                        using manual pressure, withdrawing and reinserting the                        scope and applying abdominal pressure. The patient                        tolerated the procedure well. The quality of the bowel                         preparation was evaluated using the BBPS [facility] Bowel                        Preparation Scale) with scores of: Right Colon = 3,                        Transverse Colon = 3 and Left Colon = 3 (entire mucosa                        seen well with no residual staining, small fragments of                        stool or opaque liquid). The total BBPS score equals 9. Findings:      The perianal and digital rectal examinations were normal. Pertinent       negatives include normal sphincter tone and no palpable rectal lesions.      Multiple small and large-mouthed diverticula were found in the entire       colon. There was no evidence of diverticular bleeding.      Non-bleeding external and internal hemorrhoids were found during       retroflexion. The hemorrhoids were medium-sized. Impression:           - Severe diverticulosis in the entire examined colon.                        There was no evidence of diverticular bleeding.                       - Non-bleeding external and internal hemorrhoids.                       - No specimens collected. Recommendation:       - Discharge patient to home (with escort).                       -  Resume previous diet today.                       - Continue present medications.                       - Return to my office as previously scheduled.                       - To visualize the small bowel, perform video capsule                        endoscopy. Procedure Code(s):    --- Professional ---                       [phone], Colonoscopy, flexible; diagnostic, including                        collection of specimen(s) by brushing or washing, when                        performed (separate procedure) Diagnosis Code(s):    --- Professional ---                       K64.8, Other hemorrhoids                       D50.9, Iron deficiency anemia, unspecified                       K57.30, Diverticulosis of large intestine without                         perforation or abscess without bleeding CPT copyright 2019 American Medical Association. All rights reserved. The codes documented in this report are preliminary and upon coder review may  be revised to meet current compliance requirements. Dr. Ulyess Mort Lin Landsman MD, MD 09/24/2018 10:05:51 AM This report has been signed electronically. Number of Addenda: 1 Note Initiated On: 09/24/2018 9:11 AM Scope Withdrawal Time: 0 hours 8 minutes 0 seconds  Total Procedure Duration: 0 hours 26 minutes 53 seconds  Estimated Blood Loss: Estimated blood loss: none.      [facility] Addendum Number: 1   Addendum Date: 10/01/2018 9:59:31 AM      76m descending colon polyp, sessile, removed en toto with cold snare Dr. RUlyess MortRLin LandsmanMD, MD 10/01/2018 10:00:27 AM This report has been signed electronically.

## 2018-09-24 NOTE — Op Note (Signed)
[facility] Gastroenterology Patient Name: Beverly [name] Procedure Date: 09/24/2018 9:11 AM MRN: [id number] Account #: [id number] Date of Birth: 04-Nov-1934 Admit Type: Outpatient Age: 84 Room: [facility] ENDO ROOM 2 Gender: Female Note Status: Finalized Procedure:            Upper GI endoscopy Indications:          Suspected upper gastrointestinal bleeding in patient                        with unexplained iron deficiency anemia Providers:            Lin Landsman MD, MD Referring MD:         Bethena Roys. Sowles, MD (Referring MD) Medicines:            Monitored Anesthesia Care Complications:        No immediate complications. Estimated blood loss: None. Procedure:            Pre-Anesthesia Assessment:                       - Prior to the procedure, a History and Physical was                        performed, and patient medications and allergies were                        reviewed. The patient is competent. The risks and                        benefits of the procedure and the sedation options and                        risks were discussed with the patient. All questions                        were answered and informed consent was obtained.                        Patient identification and proposed procedure were                        verified by the physician, the nurse, the                        anesthesiologist, the anesthetist and the technician in                        the pre-procedure area in the procedure room in the                        endoscopy suite. Mental Status Examination: alert and                        oriented. Airway Examination: normal oropharyngeal                        airway and neck mobility. Respiratory Examination:                        clear to auscultation. CV Examination: normal.  Prophylactic Antibiotics: The patient does not require                        prophylactic antibiotics. Prior Anticoagulants:  The                        patient has taken no previous anticoagulant or                        antiplatelet agents. ASA Grade Assessment: III - A                        patient with severe systemic disease. After reviewing                        the risks and benefits, the patient was deemed in                        satisfactory condition to undergo the procedure. The                        anesthesia plan was to use monitored anesthesia care                        (MAC). Immediately prior to administration of                        medications, the patient was re-assessed for adequacy                        to receive sedatives. The heart rate, respiratory rate,                        oxygen saturations, blood pressure, adequacy of                        pulmonary ventilation, and response to care were                        monitored throughout the procedure. The physical status                        of the patient was re-assessed after the procedure.                       After obtaining informed consent, the endoscope was                        passed under direct vision. Throughout the procedure,                        the patient's blood pressure, pulse, and oxygen                        saturations were monitored continuously. The Endoscope                        was introduced through the mouth, and advanced to the  second part of duodenum. The upper GI endoscopy was                        accomplished without difficulty. The patient tolerated                        the procedure well. Findings:      The duodenal bulb and second portion of the duodenum were normal.      The entire examined stomach was normal. Biopsies were taken with a cold       forceps for Helicobacter pylori testing.      A non-obstructing Schatzki ring was found at the gastroesophageal       junction.      LA Grade A (one or more mucosal breaks less than 5 mm, not extending        between tops of 2 mucosal folds) esophagitis with no bleeding was found       in the lower third of the esophagus. Impression:           - Normal duodenal bulb and second portion of the                        duodenum.                       - Normal stomach. Biopsied.                       - Non-obstructing Schatzki ring.                       - LA Grade A reflux esophagitis. Recommendation:       - Await pathology results.                       - Follow an antireflux regimen.                       - Use a proton pump inhibitor PO daily for 3 months.                       - Proceed with colonoscopy as scheduled                       See colonoscopy report Procedure Code(s):    --- Professional ---                       [phone], Esophagogastroduodenoscopy, flexible, transoral;                        with biopsy, single or multiple Diagnosis Code(s):    --- Professional ---                       K22.2, Esophageal obstruction                       K21.0, Gastro-esophageal reflux disease with esophagitis                       D50.9, Iron deficiency anemia, unspecified CPT copyright 2019 American Medical Association. All rights reserved. The codes documented in this report are preliminary and upon coder review  may  be revised to meet current compliance requirements. Dr. Ulyess Mort Lin Landsman MD, MD 09/24/2018 9:33:13 AM This report has been signed electronically. Number of Addenda: 0 Note Initiated On: 09/24/2018 9:11 AM Estimated Blood Loss: Estimated blood loss: none.      [facility]

## 2018-09-24 NOTE — Telephone Encounter (Signed)
Left vm to offer apt in 1 month with Dr. Marius Ditch

## 2018-09-24 NOTE — H&P (Signed)
Cephas Darby, MD [address]  [place]  [place], [place] [address]  Main: [phone]  Fax: [phone] Pager: [phone]  Primary Care Physician:  Steele Sizer, MD Primary Gastroenterologist:  Dr. Cephas Darby  Pre-Procedure History & Physical: HPI:  Beverly [name] is a 84 y.o. female is here for an endoscopy and colonoscopy.   Past Medical History:  Diagnosis Date  . Allergy   . Arthritis   . Diabetes mellitus without complication ([place])   . Hyperlipidemia   . Hypertension     Past Surgical History:  Procedure Laterality Date  . ABDOMINAL HYSTERECTOMY    . BUNIONECTOMY    . COLONOSCOPY    . HERNIA REPAIR      Prior to Admission medications   Medication Sig Start Date End Date Taking? Authorizing Provider  alendronate (FOSAMAX) 35 MG tablet TAKE 1 TABLET BY MOUTH WEEKLY WITH A FULL GLASS OF WATER ON AN EMPTY STOMACH. 05/02/18  Yes Sowles, Drue Stager, MD  atorvastatin (LIPITOR) 40 MG tablet Take 1 tablet (40 mg total) by mouth daily. 07/02/18  Yes Sowles, Drue Stager, MD  Cholecalciferol (VITAMIN D) 2000 units CAPS Take 1 capsule (2,000 Units total) by mouth daily. 10/14/16  Yes Sowles, Drue Stager, MD  DIMETHICONE, TOPICAL, 2 % CREA Apply 1 each topically 2 (two) times daily as needed. 09/05/18  Yes Sowles, Drue Stager, MD  ferrous sulfate 325 (65 FE) MG tablet Take 325 mg by mouth 2 (two) times daily with a meal.    Yes [provider]  furosemide (LASIX) 40 MG tablet Take 1 tablet (40 mg total) by mouth daily as needed. 10/06/15  Yes Sowles, Drue Stager, MD  Glucosamine Sulfate 500 MG TABS Take 1 tablet by mouth once a week.    Yes [provider]  loratadine (CLARITIN) 10 MG tablet Take 1 tablet (10 mg total) by mouth daily. 10/06/15  Yes Sowles, Drue Stager, MD  losartan (COZAAR) 25 MG tablet TAKE 2 TABLETS (50 MG) BY MOUTH EVERY DAY 50 MG TABS ON BACKORDER 08/21/18  Yes [provider]  metFORMIN (GLUCOPHAGE) 1000 MG tablet Take 1 tablet (1,000 mg  total) by mouth 2 (two) times daily with a meal. 07/02/18  Yes Sowles, Drue Stager, MD  montelukast (SINGULAIR) 10 MG tablet Take 1 tablet (10 mg total) by mouth at bedtime. 07/02/18  Yes Steele Sizer, MD  Multiple Vitamin (MULTI-VITAMINS) TABS Take by mouth.   Yes [provider]  Potassium Chloride ER 20 MEQ TBCR Take 10 mEq by mouth daily as needed. With lasix 10/06/15  Yes Sowles, Drue Stager, MD  repaglinide (PRANDIN) 2 MG tablet Take 1 tablet (2 mg total) by mouth 3 (three) times daily. 07/02/18  Yes Sowles, Drue Stager, MD  vitamin C (ASCORBIC ACID) 500 MG tablet Take 500 mg by mouth daily.   Yes [provider]  Zinc Acetate 50 MG CAPS Take by mouth daily.    Yes [provider]  telmisartan (MICARDIS) 40 MG tablet TAKE 1 TABLET BY MOUTH EVERY DAY Patient not taking: Reported on 09/24/2018 08/21/18   Steele Sizer, MD    Allergies as of 06/19/2018 - Review Complete 05/08/2018  Allergen Reaction Noted  . Lisinopril  06/02/2015    Family History  Problem Relation Age of Onset  . Hypertension Mother   . Healthy Father   . Healthy Daughter   . Raynaud syndrome Daughter   . Hypertension Daughter   . Stroke Sister     Social History   Socioeconomic History  . Marital  status: Widowed    Spouse name: [name]  . Number of children: 2  . Years of education: Not on file  . Highest education level: Bachelor's degree (e.g., BA, AB, BS)  Occupational History  . Occupation: Retired  Scientific laboratory technician  . Financial resource strain: Not hard at all  . Food insecurity    Worry: Never true    Inability: Never true  . Transportation needs    Medical: No    Non-medical: No  Tobacco Use  . Smoking status: Never Smoker  . Smokeless tobacco: Never Used  . Tobacco comment: smoking cessation materials not required  Substance and Sexual Activity  . Alcohol use: No    Alcohol/week: 0.0 standard drinks  . Drug use: No  . Sexual activity: Not Currently  Lifestyle  . Physical  activity    Days per week: 3 days    Minutes per session: 40 min  . Stress: Not at all  Relationships  . Social Herbalist on phone: Patient refused    Gets together: Patient refused    Attends religious service: Patient refused    Active member of club or organization: Patient refused    Attends meetings of clubs or organizations: Patient refused    Relationship status: Widowed  . Intimate partner violence    Fear of current or ex partner: No    Emotionally abused: No    Physically abused: No    Forced sexual activity: No  Other Topics Concern  . Not on file  Social History Narrative  . Not on file    Review of Systems: See HPI, otherwise negative ROS  Physical Exam: BP 139/69   Pulse 97   Temp (!) 97.5 F (36.4 C) (Tympanic)   Ht 5\' 5"  (1.651 m)   Wt 87.1 kg   SpO2 98%   BMI 31.95 kg/m  General:   Alert,  pleasant and cooperative in NAD Head:  Normocephalic and atraumatic. Neck:  Supple; no masses or thyromegaly. Lungs:  Clear throughout to auscultation.    Heart:  Regular rate and rhythm. Abdomen:  Soft, nontender and nondistended. Normal bowel sounds, without guarding, and without rebound.   Neurologic:  Alert and  oriented x4;  grossly normal neurologically.  Impression/Plan: JAVAYA MALTEZ is here for an endoscopy and colonoscopy to be performed for chronic IDA  Risks, benefits, limitations, and alternatives regarding  endoscopy and colonoscopy have been reviewed with the patient.  Questions have been answered.  All parties agreeable.   Sherri Sear, MD  09/24/2018, 9:05 AM

## 2018-09-24 NOTE — Anesthesia Preprocedure Evaluation (Addendum)
Anesthesia Evaluation  Patient identified by MRN, date of birth, ID band Patient awake    Reviewed: Allergy & Precautions, NPO status , Patient's Chart, lab work & pertinent test results, reviewed documented beta blocker date and time   Airway Mallampati: III  TM Distance: >3 FB     Dental  (+) Chipped   Pulmonary           Cardiovascular hypertension, Pt. on medications      Neuro/Psych    GI/Hepatic   Endo/Other  diabetes, Type 2  Renal/GU      Musculoskeletal  (+) Arthritis ,   Abdominal   Peds  Hematology   Anesthesia Other Findings Echo showed 60-65.  Reproductive/Obstetrics                             Anesthesia Physical Anesthesia Plan  ASA: III  Anesthesia Plan: General   Post-op Pain Management:    Induction: Intravenous  PONV Risk Score and Plan:   Airway Management Planned:   Additional Equipment:   Intra-op Plan:   Post-operative Plan:   Informed Consent: I have reviewed the patients History and Physical, chart, labs and discussed the procedure including the risks, benefits and alternatives for the proposed anesthesia with the patient or authorized representative who has indicated his/her understanding and acceptance.       Plan Discussed with: CRNA  Anesthesia Plan Comments:         Anesthesia Quick Evaluation

## 2018-09-25 ENCOUNTER — Encounter: Payer: Self-pay | Admitting: Gastroenterology

## 2018-09-25 LAB — SURGICAL PATHOLOGY

## 2018-09-26 ENCOUNTER — Telehealth: Payer: Self-pay

## 2018-09-26 ENCOUNTER — Other Ambulatory Visit: Payer: Self-pay

## 2018-09-26 DIAGNOSIS — D509 Iron deficiency anemia, unspecified: Secondary | ICD-10-CM

## 2018-09-26 NOTE — Telephone Encounter (Signed)
-----   Message from Lin Landsman, MD sent at 09/26/2018 11:48 AM EDT ----- Pathology results from upper endoscopy and colonoscopy came back unremarkable.  Iron deficiency anemia still unexplained.  Recommend video capsule endoscopy  Rohini Vanga

## 2018-09-26 NOTE — Telephone Encounter (Signed)
Patient was agreeable to a capsule study. Went over directions with patient and informed patient I would mail directions to her. Patient procedure is 10/11/2018. Put in referral

## 2018-09-26 NOTE — Telephone Encounter (Signed)
Called and left a message for call back  

## 2018-09-27 ENCOUNTER — Other Ambulatory Visit: Payer: Self-pay

## 2018-09-27 ENCOUNTER — Ambulatory Visit (INDEPENDENT_AMBULATORY_CARE_PROVIDER_SITE_OTHER): Payer: Medicare Other | Admitting: Internal Medicine

## 2018-09-27 ENCOUNTER — Encounter: Payer: Self-pay | Admitting: Internal Medicine

## 2018-09-27 VITALS — BP 136/64 | HR 84 | Ht 65.0 in | Wt 193.0 lb

## 2018-09-27 DIAGNOSIS — R55 Syncope and collapse: Secondary | ICD-10-CM

## 2018-09-27 NOTE — Progress Notes (Signed)
Patient Care Team: Steele Sizer, MD as PCP - General (Family Medicine) Sharlotte Alamo, DPM as Consulting Physician (Podiatry)   HPI  Beverly [name] is a 84 y.o. female Seen in followup for hospital consult for abrupt onset/offset syncope.  Eval was neg  ECG and tele unrevealing  Given outpt ZIO monitor >>  HR  avg 85  Min 60-Max 129  SVT Nonsustained 3 episodes; fastest 126 bpm for 5 beats; longest was same event  PVC PAC rare < 1 %  No symptoms    DATE TEST EF   7/20 Echo   60-65 % LVH ( IVS  9 mm /PW 13 mm)         No interval syncope Mild DOE  No CP no edema  Records and Results Reviewed   Past Medical History:  Diagnosis Date  . Allergy   . Arthritis   . Diabetes mellitus without complication ([place])   . Hyperlipidemia   . Hypertension     Past Surgical History:  Procedure Laterality Date  . ABDOMINAL HYSTERECTOMY    . BUNIONECTOMY    . COLONOSCOPY    . COLONOSCOPY WITH PROPOFOL N/A 09/24/2018   Procedure: COLONOSCOPY WITH PROPOFOL;  Surgeon: Lin Landsman, MD;  Location: [facility] ENDOSCOPY;  Service: Gastroenterology;  Laterality: N/A;  . ESOPHAGOGASTRODUODENOSCOPY (EGD) WITH PROPOFOL N/A 09/24/2018   Procedure: ESOPHAGOGASTRODUODENOSCOPY (EGD) WITH PROPOFOL;  Surgeon: Lin Landsman, MD;  Location[facility] ENDOSCOPY;  Service: Gastroenterology;  Laterality: N/A;  . HERNIA REPAIR      Current Meds  Medication Sig  . alendronate (FOSAMAX) 35 MG tablet TAKE 1 TABLET BY MOUTH WEEKLY WITH A FULL GLASS OF WATER ON AN EMPTY STOMACH.  Marland Kitchen atorvastatin (LIPITOR) 40 MG tablet Take 1 tablet (40 mg total) by mouth daily.  . Cholecalciferol (VITAMIN D) 2000 units CAPS Take 1 capsule (2,000 Units total) by mouth daily.  Marland Kitchen DIMETHICONE, TOPICAL, 2 % CREA Apply 1 each topically 2 (two) times daily as needed.  . ferrous sulfate 325 (65 FE) MG tablet Take 325 mg by mouth 2 (two) times daily with a meal.   . furosemide (LASIX) 40 MG tablet Take 1 tablet (40 mg total)  by mouth daily as needed.  . Glucosamine Sulfate 500 MG TABS Take 1 tablet by mouth once a week.   . loratadine (CLARITIN) 10 MG tablet Take 1 tablet (10 mg total) by mouth daily.  Marland Kitchen losartan (COZAAR) 25 MG tablet TAKE 2 TABLETS (50 MG) BY MOUTH EVERY DAY 50 MG TABS ON BACKORDER  . metFORMIN (GLUCOPHAGE) 1000 MG tablet Take 1 tablet (1,000 mg total) by mouth 2 (two) times daily with a meal.  . montelukast (SINGULAIR) 10 MG tablet Take 1 tablet (10 mg total) by mouth at bedtime.  . Multiple Vitamin (MULTI-VITAMINS) TABS Take by mouth.  Marland Kitchen omeprazole (PRILOSEC) 20 MG capsule Take 1 capsule (20 mg total) by mouth 2 (two) times daily before a meal.  . Potassium Chloride ER 20 MEQ TBCR Take 10 mEq by mouth daily as needed. With lasix  . repaglinide (PRANDIN) 2 MG tablet Take 1 tablet (2 mg total) by mouth 3 (three) times daily.  Marland Kitchen telmisartan (MICARDIS) 40 MG tablet TAKE 1 TABLET BY MOUTH EVERY DAY  . vitamin C (ASCORBIC ACID) 500 MG tablet Take 500 mg by mouth daily.  . Zinc Acetate 50 MG CAPS Take by mouth daily.     Allergies  Allergen Reactions  . Lisinopril  Review of Systems negative except from HPI and PMH  Physical Exam BP 136/64 (BP Location: Left Arm, Patient Position: Sitting, Cuff Size: Normal)   Pulse 84   Ht 5\' 5"  (1.651 m)   Wt 193 lb (87.5 kg)   BMI 32.12 kg/m  Well developed and well nourished in no acute distress HENT normal E scleral and icterus clear Neck Supple Clear to ausculation Regular rate and rhythm, no murmurs gallops or rub Soft with active bowel sounds No clubbing cyanosis  Edema Alert and oriented, grossly normal motor and sensory function Skin Warm and Dry  ECG sinus @ 84 19/08/36 PRWP  Assessment and  Plan  Syncope  Hypertension   Lengthy discussion with the patient and her friend regarding the role of a loop recorder as a strategy for diagnosis.  The other option I think is to do nothing at all.  Given the abruptness of her syncope  and her age I am inclined toward loop recorder not withstanding this being the first episode of syncope.  She tells me today that there was a new blood pressure medication, telmisartan substituted for losartan that day.  I think this change in medication is unlikely the explanation--we discussed again the mechanisms of syncope related to either vascular. ie vasomotor versus a cardiac cause.  In favor of the latter would be the age of onset and the abruptness of the onset and the offset.  In favor of the former is the lack of evidence of structural heart disease.  We discussed the loop recorder as a diagnostic but not therapeutic tool,its assoc fees  She will let us know what she would like to do  We spent more than 50% of our >25 min visit in face to face counseling regarding the above      Current medicines are reviewed at length with the patient today .  The patient does not  have concerns regarding medicines.

## 2018-09-27 NOTE — Patient Instructions (Signed)
Medication Instructions:  - Your physician recommends that you continue on your current medications as directed. Please refer to the Current Medication list given to you today.  If you need a refill on your cardiac medications before your next appointment, please call your pharmacy.   Lab work: - none ordered  If you have labs (blood work) drawn today and your tests are completely normal, you will receive your results only by: Marland Kitchen MyChart Message (if you have MyChart) OR . A paper copy in the mail If you have any lab test that is abnormal or we need to change your treatment, we will call you to review the results.  Testing/Procedures: - none ordered  Follow-Up: At [facility], you and your health needs are our priority.  As part of our continuing mission to provide you with exceptional heart care, we have created designated Provider Care Teams.  These Care Teams include your primary Cardiologist (physician) and Advanced Practice Providers (APPs -  Physician Assistants and Nurse Practitioners) who all work together to provide you with the care you need, when you need it. Marland Kitchen as needed with Dr. Caryl Comes (please call if you would like a loop recorder placed)  [phone] Conn, RN for Dr. Caryl Comes.   Any Other Special Instructions Will Be Listed Below (If Applicable). - N/A

## 2018-10-01 ENCOUNTER — Telehealth: Payer: Self-pay | Admitting: Internal Medicine

## 2018-10-01 NOTE — Telephone Encounter (Signed)
Patient calling in stating "Tell Dr. Caryl Comes I have decided not to have the heart monitor implanted in me". For any further guidance or advise, please call patient

## 2018-10-02 NOTE — Telephone Encounter (Signed)
Noted- to Dr. Caryl Comes to make him aware.

## 2018-10-05 NOTE — Telephone Encounter (Signed)
Noted  Heather Thanks SK

## 2018-10-08 ENCOUNTER — Other Ambulatory Visit: Admission: RE | Admit: 2018-10-08 | Payer: Medicare Other | Source: Ambulatory Visit

## 2018-10-11 ENCOUNTER — Ambulatory Visit
Admission: RE | Admit: 2018-10-11 | Discharge: 2018-10-11 | Disposition: A | Discharge status: Home / Self Care | Payer: Medicare Other | Attending: Gastroenterology | Admitting: Gastroenterology

## 2018-10-11 ENCOUNTER — Encounter
Admission: RE | Disposition: A | Discharge status: Home / Self Care | Payer: Self-pay | Source: Home / Self Care | Attending: Gastroenterology

## 2018-10-11 ENCOUNTER — Other Ambulatory Visit: Payer: Self-pay

## 2018-10-11 ENCOUNTER — Encounter: Payer: Self-pay | Admitting: Certified Registered Nurse Anesthetist

## 2018-10-11 DIAGNOSIS — D500 Iron deficiency anemia secondary to blood loss (chronic): Secondary | ICD-10-CM

## 2018-10-11 DIAGNOSIS — D509 Iron deficiency anemia, unspecified: Secondary | ICD-10-CM | POA: Diagnosis not present

## 2018-10-11 HISTORY — PX: GIVENS CAPSULE STUDY: SHX5432

## 2018-10-11 SURGERY — IMAGING PROCEDURE, GI TRACT, INTRALUMINAL, VIA CAPSULE

## 2018-10-12 ENCOUNTER — Encounter: Payer: Self-pay | Admitting: Gastroenterology

## 2018-10-12 ENCOUNTER — Other Ambulatory Visit: Payer: Self-pay | Admitting: Family Medicine

## 2018-10-12 DIAGNOSIS — M8580 Other specified disorders of bone density and structure, unspecified site: Secondary | ICD-10-CM

## 2018-10-16 ENCOUNTER — Encounter: Payer: Self-pay | Admitting: Gastroenterology

## 2018-11-01 ENCOUNTER — Encounter: Payer: Self-pay | Admitting: Family Medicine

## 2018-11-01 ENCOUNTER — Ambulatory Visit: Payer: Medicare Other | Admitting: Family Medicine

## 2018-11-01 ENCOUNTER — Other Ambulatory Visit: Payer: Self-pay

## 2018-11-01 VITALS — BP 134/70 | HR 85 | Temp 96.9°F | Resp 14 | Ht 65.0 in | Wt 189.9 lb

## 2018-11-01 DIAGNOSIS — D508 Other iron deficiency anemias: Secondary | ICD-10-CM

## 2018-11-01 DIAGNOSIS — E113553 Type 2 diabetes mellitus with stable proliferative diabetic retinopathy, bilateral: Secondary | ICD-10-CM

## 2018-11-01 DIAGNOSIS — E785 Hyperlipidemia, unspecified: Secondary | ICD-10-CM

## 2018-11-01 DIAGNOSIS — I10 Essential (primary) hypertension: Secondary | ICD-10-CM | POA: Diagnosis not present

## 2018-11-01 DIAGNOSIS — E1169 Type 2 diabetes mellitus with other specified complication: Secondary | ICD-10-CM

## 2018-11-01 DIAGNOSIS — J3089 Other allergic rhinitis: Secondary | ICD-10-CM

## 2018-11-01 DIAGNOSIS — E441 Mild protein-calorie malnutrition: Secondary | ICD-10-CM

## 2018-11-01 LAB — POCT GLYCOSYLATED HEMOGLOBIN (HGB A1C): Hemoglobin A1C: 6 % — AB (ref 4.0–5.6)

## 2018-11-01 MED ORDER — METFORMIN HCL 1000 MG PO TABS: 1000.0000 mg | ORAL_TABLET | Freq: Two times a day (BID) | ORAL | 1 refills | Status: DC

## 2018-11-01 NOTE — Progress Notes (Signed)
Name: Beverly [name]   MRN: [id number]    DOB: 27-Nov-1934   Date:11/01/2018       Progress Note  Subjective  Chief Complaint  Chief Complaint  Patient presents with  . Diabetes  . Dyslipidemia  . Hypertension    HPI  Iron deficiency anemia: she was seen by Dr. Marius Ditch and had EGD and colonoscopy done 09/212020, only one polyp, she is taking iron supplementation twice daily. She states difficulty eating because after the fall she lost her teeth, going to get new dentures soon. Last HCT improved a little.   DMII: she has been taking Metformin to 1500 mg daily,takingPrandin before breakfast and dinner ( usually eats a very light lunch). Fasting 95-105 and post- prandially 150-160's Eye exam is up to date, recently seen by podiatrist  She denies polyphagia or polydipsia but she has nocturia - twice per night - stable. No side effects of medication, no recent hypoglycemic episodes.  HgbA1C was 7.6%,to 8.5% down to 6.8% up to8.2%, 8.4%, down to 6.7%, 7.2%, 7.1%, 6.8%,6.6% on 6%   HTN:BP is at goal today, no chest pain, no palpitationor dizziness, she is off HCTZ. Taking medication as prescribed, no side effects. Takes Furosemide prn only for severe swelling, discussed compression stocking hoses to control swelling. She states at home bp 120's-70's-80's   Hyperlipidemia: taking Atorvastatin, no myalgias.Recheck labs today   AR: well controlled,taking medication prn and is doing well at this time, no rhinorrhea or nasal congestion at this time. Doing well at this time  OA: she has right knee pain, it started after a fall 35 years ago secondary to a fall, she takes arthritis pain pill otc Pain is described as dull and aching, intermittent but is doing well on tylenol. She has not been seeing chiropractor recently. Pain is present on both knees now, she thinks secondary to fall this past Summer   Unintentional weight loss: she hadlost 18 lbs from 2018 to 2019 .She states she  was under a lot of stress since Summer of 2018 worried about grand-daughteralso her daughter was diagnosed with uterine cancer. Since she lost weight, she got motivated to eat healthier, she feel Summer 2020 and broker her dentures during a Syncopal episode. Explained that we can look for occult cancer but she is not interested   October 2017 weight was 228 lbs October 2018 weight was 217 lbs October 2019 weight was 207 lbs October 2020 weight is 189 lbs  Syncope: seen by cardiologist and they suggested to get heart monitor but she states she did not want it.   Right kidney stone: seen by nephrologist, had a renal US that showed 6 mm non obstructing stone, no pain at this time or hematuria , unchanged   Patient Active Problem List   Diagnosis Date Noted  . Iron deficiency anemia   . Subdural hematoma ([place])   . Syncope and collapse 07/24/2018  . 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 Type 2 diabetes mellitus with pressure callus ([place]) 10/24/2017  . Diabetic retinopathy ([place]) 11/12/2015  . Obesity (BMI 30.0-34.9) 06/02/2015  . Osteoarthritis 06/02/2015  . Osteopenia 06/02/2015  . Narrowing of intervertebral disc space 06/02/2015  . Dyslipidemia associated with type 2 diabetes mellitus ([place]) 06/02/2015  . Perennial allergic rhinitis 02/28/2007  . Benign essential HTN 06/26/2006  . Pure hypercholesterolemia 06/26/2006    Past Surgical History:  Procedure Laterality Date  . ABDOMINAL HYSTERECTOMY    .  BUNIONECTOMY    . COLONOSCOPY    . COLONOSCOPY WITH PROPOFOL N/A 09/24/2018   Procedure: COLONOSCOPY WITH PROPOFOL;  Surgeon: Lin Landsman, MD;  Location: [facility] ENDOSCOPY;  Service: Gastroenterology;  Laterality: N/A;  . ESOPHAGOGASTRODUODENOSCOPY (EGD) WITH PROPOFOL N/A 09/24/2018   Procedure: ESOPHAGOGASTRODUODENOSCOPY (EGD) WITH PROPOFOL;  Surgeon: Lin Landsman, MD;   Location: [facility] ENDOSCOPY;  Service: Gastroenterology;  Laterality: N/A;  . GIVENS CAPSULE STUDY N/A 10/11/2018   Procedure: GIVENS CAPSULE STUDY;  Surgeon: Lin Landsman, MD;  Location: [facility] ENDOSCOPY;  Service: Gastroenterology;  Laterality: N/A;  . HERNIA REPAIR      Family History  Problem Relation Age of Onset  . Hypertension Mother   . Healthy Father   . Healthy Daughter   . Raynaud syndrome Daughter   . Hypertension Daughter   . Stroke Sister     Social History   Socioeconomic History  . Marital status: Widowed    Spouse name: [name]  . Number of children: 2  . Years of education: Not on file  . Highest education level: Bachelor's degree (e.g., BA, AB, BS)  Occupational History  . Occupation: Retired  Scientific laboratory technician  . Financial resource strain: Not hard at all  . Food insecurity    Worry: Never true    Inability: Never true  . Transportation needs    Medical: No    Non-medical: No  Tobacco Use  . Smoking status: Never Smoker  . Smokeless tobacco: Never Used  . Tobacco comment: smoking cessation materials not required  Substance and Sexual Activity  . Alcohol use: No    Alcohol/week: 0.0 standard drinks  . Drug use: No  . Sexual activity: Not Currently  Lifestyle  . Physical activity    Days per week: 3 days    Minutes per session: 40 min  . Stress: Not at all  Relationships  . Social Herbalist on phone: Patient refused    Gets together: Patient refused    Attends religious service: Patient refused    Active member of club or organization: Patient refused    Attends meetings of clubs or organizations: Patient refused    Relationship status: Widowed  . Intimate partner violence    Fear of current or ex partner: No    Emotionally abused: No    Physically abused: No    Forced sexual activity: No  Other Topics Concern  . Not on file  Social History Narrative  . Not on file     Current Outpatient Medications:  .  alendronate (FOSAMAX)  35 MG tablet, TAKE 1 TABLET BY MOUTH WEEKLY WITH A FULL GLASS OF WATER ON AN EMPTY STOMACH., Disp: 12 tablet, Rfl: 1 .  atorvastatin (LIPITOR) 40 MG tablet, Take 1 tablet (40 mg total) by mouth daily., Disp: 90 tablet, Rfl: 1 .  Cholecalciferol (VITAMIN D) 2000 units CAPS, Take 1 capsule (2,000 Units total) by mouth daily., Disp: 30 capsule, Rfl: 0 .  DIMETHICONE, TOPICAL, 2 % CREA, Apply 1 each topically 2 (two) times daily as needed., Disp: 48 g, Rfl: 0 .  ferrous sulfate 325 (65 FE) MG tablet, Take 325 mg by mouth 2 (two) times daily with a meal. , Disp: , Rfl:  .  furosemide (LASIX) 40 MG tablet, Take 1 tablet (40 mg total) by mouth daily as needed., Disp: 90 tablet, Rfl: 0 .  Glucosamine Sulfate 500 MG TABS, Take 1 tablet by mouth once a week. , Disp: , Rfl:  .  loratadine (CLARITIN) 10 MG tablet, Take 1 tablet (10 mg total) by mouth daily., Disp: 90 tablet, Rfl: 1 .  losartan (COZAAR) 25 MG tablet, TAKE 2 TABLETS (50 MG) BY MOUTH EVERY DAY 50 MG TABS ON BACKORDER, Disp: , Rfl:  .  montelukast (SINGULAIR) 10 MG tablet, Take 1 tablet (10 mg total) by mouth at bedtime., Disp: 90 tablet, Rfl: 1 .  Multiple Vitamin (MULTI-VITAMINS) TABS, Take by mouth., Disp: , Rfl:  .  Potassium Chloride ER 20 MEQ TBCR, Take 10 mEq by mouth daily as needed. With lasix, Disp: , Rfl:  .  repaglinide (PRANDIN) 2 MG tablet, Take 1 tablet (2 mg total) by mouth 3 (three) times daily., Disp: 270 tablet, Rfl: 1 .  vitamin C (ASCORBIC ACID) 500 MG tablet, Take 500 mg by mouth daily., Disp: , Rfl:  .  Zinc Acetate 50 MG CAPS, Take by mouth daily. , Disp: , Rfl:  .  metFORMIN (GLUCOPHAGE) 1000 MG tablet, Take 1 tablet (1,000 mg total) by mouth 2 (two) times daily with a meal., Disp: 180 tablet, Rfl: 0 .  omeprazole (PRILOSEC) 20 MG capsule, Take 1 capsule (20 mg total) by mouth 2 (two) times daily before a meal., Disp: 60 capsule, Rfl: 2 .  telmisartan (MICARDIS) 40 MG tablet, TAKE 1 TABLET BY MOUTH EVERY DAY (Patient not  taking: Reported on 11/01/2018), Disp: 90 tablet, Rfl: 0  Allergies  Allergen Reactions  . Lisinopril     I personally reviewed active problem list, medication list, allergies, family history, social history, health maintenance with the patient/caregiver today.   ROS  Constitutional: Negative for fever, positive for  weight change.  Respiratory: Negative for cough and shortness of breath.   Cardiovascular: Negative for chest pain or palpitations.  Gastrointestinal: Negative for abdominal pain, no bowel changes.  Musculoskeletal: Negative for gait problem or joint swelling.  Skin: Negative for rash.  Neurological: Negative for dizziness or headache.  No other specific complaints in a complete review of systems (except as listed in HPI above).  Objective  Vitals:   11/01/18 1002  BP: 134/70  Pulse: 85  Resp: 14  Temp: (!) 96.9 F (36.1 C)  SpO2: 94%  Weight: 189 lb 14.4 oz (86.1 kg)  Height: 5\' 5"  (1.651 m)    Body mass index is 31.6 kg/m.  Physical Exam  Constitutional: Patient appears well-developed and well-nourished. Obese  No distress.  HEENT: head atraumatic, normocephalic, pupils equal and reactive to light Cardiovascular: Normal rate, regular rhythm and normal heart sounds.  No murmur heard. No BLE edema. Pulmonary/Chest: Effort normal and breath sounds normal. No respiratory distress. Abdominal: Soft.  There is no tenderness. Psychiatric: Patient has a normal mood and affect. behavior is normal. Judgment and thought content normal.  Recent Results (from the past 2160 hour(s))  CBC with Differential/Platelet     Status: Abnormal   Collection Time: 08/06/18 10:49 AM  Result Value Ref Range   WBC 7.2 3.8 - 10.8 Thousand/uL   RBC 3.73 (L) 3.80 - 5.10 Million/uL   Hemoglobin 11.6 (L) 11.7 - 15.5 g/dL   HCT 35.7 35.0 - 45.0 %   MCV 95.7 80.0 - 100.0 fL   MCH 31.1 27.0 - 33.0 pg   MCHC 32.5 32.0 - 36.0 g/dL   RDW 13.6 11.0 - 15.0 %   Platelets 260 140 - 400  Thousand/uL   MPV 11.9 7.5 - 12.5 fL   Neutro Abs 2,714 1,500 - 7,800 cells/uL   Lymphs Abs 4,032 (  H) 850 - 3,900 cells/uL   Absolute Monocytes 389 200 - 950 cells/uL   Eosinophils Absolute 58 15 - 500 cells/uL   Basophils Absolute 7 0 - 200 cells/uL   Neutrophils Relative % 37.7 %   Total Lymphocyte 56.0 %   Monocytes Relative 5.4 %   Eosinophils Relative 0.8 %   Basophils Relative 0.1 %  COMPLETE METABOLIC PANEL WITH GFR     Status: Abnormal   Collection Time: 08/06/18 10:49 AM  Result Value Ref Range   Glucose, Bld 109 (H) 65 - 99 mg/dL    Comment: .            Fasting reference interval . For someone without known diabetes, a glucose value between 100 and 125 mg/dL is consistent with prediabetes and should be confirmed with a follow-up test. .    BUN 12 7 - 25 mg/dL   Creat 0.83 0.60 - 0.88 mg/dL    Comment: For patients >51 years of age, the reference limit for Creatinine is approximately 13% higher for people identified as African-American. .    GFR, Est Non African American 66 > OR = 60 mL/min/1.75m2   GFR, Est African American 76 > OR = 60 mL/min/1.69m2   BUN/Creatinine Ratio NOT APPLICABLE 6 - 22 (calc)   Sodium 145 135 - 146 mmol/L   Potassium 4.3 3.5 - 5.3 mmol/L   Chloride 106 98 - 110 mmol/L   CO2 28 20 - 32 mmol/L   Calcium 9.8 8.6 - 10.4 mg/dL   Total Protein 6.9 6.1 - 8.1 g/dL   Albumin 4.1 3.6 - 5.1 g/dL   Globulin 2.8 1.9 - 3.7 g/dL (calc)   AG Ratio 1.5 1.0 - 2.5 (calc)   Total Bilirubin 0.4 0.2 - 1.2 mg/dL   Alkaline phosphatase (APISO) 54 37 - 153 U/L   AST 15 10 - 35 U/L   ALT 5 (L) 6 - 29 U/L  Magnesium     Status: None   Collection Time: 08/06/18 10:49 AM  Result Value Ref Range   Magnesium 1.7 1.5 - 2.5 mg/dL  SARS CORONAVIRUS 2 (TAT 6-24 HRS) Nasopharyngeal Nasopharyngeal Swab     Status: None   Collection Time: 09/20/18 11:44 AM   Specimen: Nasopharyngeal Swab  Result Value Ref Range   SARS Coronavirus 2 NEGATIVE NEGATIVE    Comment:  (NOTE) SARS-CoV-2 target nucleic acids are NOT DETECTED. The SARS-CoV-2 RNA is generally detectable in upper and lower respiratory specimens during the acute phase of infection. Negative results do not preclude SARS-CoV-2 infection, do not rule out co-infections with other pathogens, and should not be used as the sole basis for treatment or other patient management decisions. Negative results must be combined with clinical observations, patient history, and epidemiological information. The expected result is Negative. Fact Sheet for Patients: SugarRoll.be Fact Sheet for Healthcare Providers: https://www.woods-mathews.com/ This test is not yet approved or cleared by the [place] FDA and  has been authorized for detection and/or diagnosis of SARS-CoV-2 by FDA under an Emergency Use Authorization (EUA). This EUA will remain  in effect (meaning this test can be used) for the duration of the COVID-19 declaration under Section 56 4(b)(1) of the Act, 21 U.S.C. section 360bbb-3(b)(1), unless the authorization is terminated or revoked sooner. Performed at [facility], [place] [address] [place], [place] [address]   Glucose, capillary     Status: Abnormal   Collection Time: 09/24/18  8:27 AM  Result Value Ref Range  Glucose-Capillary 138 (H) 70 - 99 mg/dL  Surgical pathology     Status: None   Collection Time: 09/24/18  9:26 AM  Result Value Ref Range   SURGICAL PATHOLOGY      SURGICAL PATHOLOGY CASE: [id number] PATIENT: Johna Sheriff Surgical Pathology Report     Specimen Submitted: A. Stomach, random, iron deficiency anemia; cbx B. Colon polyp, descending; cold snare  Clinical History: Iron deficiency anemia D50.9.  Mild esophagitis, diverticulosis      DIAGNOSIS: A. STOMACH, RANDOM; COLD BIOPSY: - REACTIVE GASTROPATHY. - NEGATIVE FOR ACTIVE INFLAMMATION AND H PYLORI. - NEGATIVE FOR INTESTINAL METAPLASIA, DYSPLASIA,  AND MALIGNANCY.  B. COLON POLYP, DESCENDING; COLD SNARE: - TUBULAR ADENOMA. - NEGATIVE FOR HIGH-GRADE DYSPLASIA AND MALIGNANCY.   GROSS DESCRIPTION: A. Labeled: C BX random stomach, iron deficit anemia Received: In formalin Tissue fragment(s): 3 Size: From 0.2-0.4 cm Description: Tan soft tissue fragments Entirely submitted in 1 cassette.  B. Labeled: Cold snare descending colon polyp Received: In formalin Tissue fragment(s): 1 Size: 0.8 x 0.2 x 0.1 cm Description: Tan soft tissue fragment Entirely  submitted in 1 cassette.   Final Diagnosis performed by Betsy Pries, MD.   Electronically signed 09/25/2018 9:30:35AM The electronic signature indicates that the named Attending Pathologist has evaluated the specimen Technical component performed at [facility], [address] [place], [place] [address] Lab: [phone] Dir: Rush Farmer, MD, MMM  Professional component performed at [facility], [facility], [place], [place], [place] [address] Lab: [phone] Dir: Dellia Nims. Rubinas, MD   POCT HgB A1C     Status: Abnormal   Collection Time: 11/01/18 10:20 AM  Result Value Ref Range   Hemoglobin A1C 6.0 (A) 4.0 - 5.6 %   HbA1c POC (<> result, manual entry)     HbA1c, POC (prediabetic range)     HbA1c, POC (controlled diabetic range)       PHQ2/9: Depression screen [facility] 2/9 11/01/2018 09/05/2018 08/06/2018 07/02/2018 02/27/2018  Decreased Interest 0 0 0 1 0  Down, Depressed, Hopeless 0 0 0 0 0  PHQ - 2 Score 0 0 0 1 0  Altered sleeping 0 0 0 0 0  Tired, decreased energy 0 0 0 0 0  Change in appetite 0 0 0 0 0  Feeling bad or failure about yourself  0 0 0 0 0  Trouble concentrating 0 0 0 0 0  Moving slowly or fidgety/restless 0 0 0 0 0  Suicidal thoughts 0 0 0 0 0  PHQ-9 Score 0 0 0 1 0  Difficult doing work/chores Not difficult at all - - Not difficult at all Not difficult at all  Some recent data might be hidden    phq 9 is negative  Fall  Risk: Fall Risk  11/01/2018 09/05/2018 08/06/2018 07/02/2018 02/27/2018  Falls in the past year? 1 1 1  0 0  Number falls in past yr: 0 0 0 0 -  Injury with Fall? 1 1 0 0 -  Risk for fall due to : - Other (Comment) - - -  Risk for fall due to: Comment - Syncopy - - -     Functional Status Survey: Is the patient deaf or have difficulty hearing?: No Does the patient have difficulty seeing, even when wearing glasses/contacts?: No Does the patient have difficulty concentrating, remembering, or making decisions?: No Does the patient have difficulty walking or climbing stairs?: No Does the patient have difficulty dressing or bathing?: No Does the patient have difficulty doing errands alone  such as visiting a doctor's office or shopping?: No    Assessment & Plan  1. 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  Dispense: 180 tablet; Refill: 1  2. Dyslipidemia associated with type 2 diabetes mellitus (HCC)  - Lipid panel - metFORMIN (GLUCOPHAGE) 1000 MG tablet; Take 1 tablet (1,000 mg total) by mouth 2 (two) times daily with a meal.  Dispense: 180 tablet; Refill: 1  3. Benign essential HTN  - CBC with Differential/Platelet - COMPLETE METABOLIC PANEL WITH GFR  4. Dyslipidemia   5. Other iron deficiency anemia  - Iron, TIBC and Ferritin Panel  6. Perennial allergic rhinitis   7. Malnutrition    She will try to take protein shakes and is hoping that once she gets new dentures she will start to gain weight

## 2018-11-02 LAB — MICROALBUMIN / CREATININE URINE RATIO
Creatinine, Urine: 151 mg/dL (ref 20–275)
Microalb Creat Ratio: 41 mcg/mg creat — ABNORMAL HIGH (ref <30)
Microalb, Ur: 6.2 mg/dL

## 2018-11-02 LAB — LIPID PANEL
Cholesterol: 123 mg/dL (ref <200)
HDL: 48 mg/dL — ABNORMAL LOW (ref >=50)
LDL Cholesterol (Calc): 62 mg/dL (calc)
Non-HDL Cholesterol (Calc): 75 mg/dL (calc) (ref <130)
Total CHOL/HDL Ratio: 2.6 (calc) (ref <5.0)
Triglycerides: 52 mg/dL (ref <150)

## 2018-11-02 LAB — COMPLETE METABOLIC PANEL WITH GFR
AG Ratio: 1.5 (calc) (ref 1.0–2.5)
ALT: 6 U/L (ref 6–29)
AST: 16 U/L (ref 10–35)
Albumin: 3.9 g/dL (ref 3.6–5.1)
Alkaline phosphatase (APISO): 62 U/L (ref 37–153)
BUN: 9 mg/dL (ref 7–25)
CO2: 28 mmol/L (ref 20–32)
Calcium: 9.1 mg/dL (ref 8.6–10.4)
Chloride: 107 mmol/L (ref 98–110)
Creat: 0.69 mg/dL (ref 0.60–0.88)
GFR, Est African American: 94 mL/min/{1.73_m2} (ref >=60)
GFR, Est Non African American: 81 mL/min/{1.73_m2} (ref >=60)
Globulin: 2.6 g/dL (calc) (ref 1.9–3.7)
Glucose, Bld: 180 mg/dL — ABNORMAL HIGH (ref 65–99)
Potassium: 4 mmol/L (ref 3.5–5.3)
Sodium: 144 mmol/L (ref 135–146)
Total Bilirubin: 0.4 mg/dL (ref 0.2–1.2)
Total Protein: 6.5 g/dL (ref 6.1–8.1)

## 2018-11-02 LAB — IRON,TIBC AND FERRITIN PANEL
%SAT: 31 % (calc) (ref 16–45)
Ferritin: 27 ng/mL (ref 16–288)
Iron: 73 ug/dL (ref 45–160)
TIBC: 238 mcg/dL (calc) — ABNORMAL LOW (ref 250–450)

## 2018-11-02 LAB — CBC WITH DIFFERENTIAL/PLATELET
Absolute Monocytes: 360 cells/uL (ref 200–950)
Basophils Absolute: 18 cells/uL (ref 0–200)
Basophils Relative: 0.3 %
Eosinophils Absolute: 30 cells/uL (ref 15–500)
Eosinophils Relative: 0.5 %
HCT: 33.3 % — ABNORMAL LOW (ref 35.0–45.0)
Hemoglobin: 10.8 g/dL — ABNORMAL LOW (ref 11.7–15.5)
Lymphs Abs: 3369 cells/uL (ref 850–3900)
MCH: 30.9 pg (ref 27.0–33.0)
MCHC: 32.4 g/dL (ref 32.0–36.0)
MCV: 95.4 fL (ref 80.0–100.0)
MPV: 12.1 fL (ref 7.5–12.5)
Monocytes Relative: 6.1 %
Neutro Abs: 2124 cells/uL (ref 1500–7800)
Neutrophils Relative %: 36 %
Platelets: 210 10*3/uL (ref 140–400)
RBC: 3.49 10*6/uL — ABNORMAL LOW (ref 3.80–5.10)
RDW: 13.4 % (ref 11.0–15.0)
Total Lymphocyte: 57.1 %
WBC: 5.9 10*3/uL (ref 3.8–10.8)

## 2018-11-06 ENCOUNTER — Ambulatory Visit: Payer: Medicare Other | Admitting: Gastroenterology

## 2018-11-06 ENCOUNTER — Other Ambulatory Visit: Payer: Self-pay

## 2018-11-06 ENCOUNTER — Encounter: Payer: Self-pay | Admitting: Gastroenterology

## 2018-11-06 VITALS — BP 144/75 | HR 90 | Temp 98.8°F | Resp 17 | Ht 65.0 in | Wt 195.2 lb

## 2018-11-06 DIAGNOSIS — D509 Iron deficiency anemia, unspecified: Secondary | ICD-10-CM | POA: Diagnosis not present

## 2018-11-06 NOTE — Progress Notes (Signed)
Cephas Darby, MD [address]  [place]  [place], [place] [address]  Main: [phone]  Fax: [phone] Pager: [phone]   Primary Care Physician: Steele Sizer, MD  Primary Gastroenterologist:  Dr. Cephas Darby  Chief Complaint  Patient presents with  . Follow-up    HPI: Beverly [name] is a 84 y.o. female with history of iron deficiency anemia seen for follow-up.  Patient underwent EGD and colonoscopy which were unremarkable.  Underwent video capsule endoscopy which revealed diminutive nonbleeding erosion in distal small bowel.  Otherwise normal study.  Patient's hemoglobin has been stable and she continues to take oral iron 2 times a day.  She does not have any concerns today.  She denies black stools, abdominal pain, rectal bleeding, weight loss  Current Outpatient Medications  Medication Sig Dispense Refill  . alendronate (FOSAMAX) 35 MG tablet TAKE 1 TABLET BY MOUTH WEEKLY WITH A FULL GLASS OF WATER ON AN EMPTY STOMACH. 12 tablet 1  . atorvastatin (LIPITOR) 40 MG tablet Take 1 tablet (40 mg total) by mouth daily. 90 tablet 1  . Cholecalciferol (VITAMIN D) 2000 units CAPS Take 1 capsule (2,000 Units total) by mouth daily. 30 capsule 0  . DIMETHICONE, TOPICAL, 2 % CREA Apply 1 each topically 2 (two) times daily as needed. 48 g 0  . ferrous sulfate 325 (65 FE) MG tablet Take 325 mg by mouth 2 (two) times daily with a meal.     . furosemide (LASIX) 40 MG tablet Take 1 tablet (40 mg total) by mouth daily as needed. 90 tablet 0  . Glucosamine Sulfate 500 MG TABS Take 1 tablet by mouth once a week.     . loratadine (CLARITIN) 10 MG tablet Take 1 tablet (10 mg total) by mouth daily. 90 tablet 1  . losartan (COZAAR) 25 MG tablet TAKE 2 TABLETS (50 MG) BY MOUTH EVERY DAY 50 MG TABS ON BACKORDER    . metFORMIN (GLUCOPHAGE) 1000 MG tablet Take 1 tablet (1,000 mg total) by mouth 2 (two) times daily with a meal. 180 tablet 1  . montelukast (SINGULAIR) 10 MG tablet Take 1  tablet (10 mg total) by mouth at bedtime. 90 tablet 1  . Multiple Vitamin (MULTI-VITAMINS) TABS Take by mouth.    . Potassium Chloride ER 20 MEQ TBCR Take 10 mEq by mouth daily as needed. With lasix    . repaglinide (PRANDIN) 2 MG tablet Take 1 tablet (2 mg total) by mouth 3 (three) times daily. 270 tablet 1  . vitamin C (ASCORBIC ACID) 500 MG tablet Take 500 mg by mouth daily.    . Zinc Acetate 50 MG CAPS Take by mouth daily.     Marland Kitchen omeprazole (PRILOSEC) 20 MG capsule Take 1 capsule (20 mg total) by mouth 2 (two) times daily before a meal. 60 capsule 2   No current facility-administered medications for this visit.     Allergies as of 11/06/2018 - Review Complete 11/06/2018  Allergen Reaction Noted  . Lisinopril  06/02/2015    NSAIDs: None  Antiplts/Anticoagulants/Anti thrombotics: None  GI procedures:  EGD and colonoscopy 09/24/2018 - Normal duodenal bulb and second portion of the duodenum. - Normal stomach. Biopsied. - Non-obstructing Schatzki ring. - LA Grade A reflux esophagitis.  - Severe diverticulosis in the entire examined colon. There was no evidence of diverticular bleeding. - Non-bleeding external and internal hemorrhoids. - No specimens collected.  DIAGNOSIS:  A. STOMACH, RANDOM; COLD BIOPSY:  - REACTIVE GASTROPATHY.  - NEGATIVE  FOR ACTIVE INFLAMMATION AND H PYLORI.  - NEGATIVE FOR INTESTINAL METAPLASIA, DYSPLASIA, AND MALIGNANCY.   B. COLON POLYP, DESCENDING; COLD SNARE:  - TUBULAR ADENOMA.  - NEGATIVE FOR HIGH-GRADE DYSPLASIA AND MALIGNANCY.   Video capsule endoscopy 10/11/2018   ROS:  General: Negative for anorexia, weight loss, fever, chills, fatigue, weakness. ENT: Negative for hoarseness, difficulty swallowing , nasal congestion. CV: Negative for chest pain, angina, palpitations, dyspnea on exertion, peripheral edema.  Respiratory: Negative for dyspnea at rest, dyspnea on exertion, cough, sputum, wheezing.  GI: See history of present illness. GU:   Negative for dysuria, hematuria, urinary incontinence, urinary frequency, nocturnal urination.  Endo: Negative for unusual weight change.    Physical Examination:   BP (!) 144/75 (BP Location: Left Arm, Patient Position: Sitting, Cuff Size: Large)   Pulse 90   Temp 98.8 F (37.1 C)   Resp 17   Ht 5\' 5"  (1.651 m)   Wt 195 lb 3.2 oz (88.5 kg)   BMI 32.48 kg/m   General: Well-nourished, well-developed in no acute distress.  Eyes: No icterus. Conjunctivae pink. Mouth: Oropharyngeal mucosa moist and pink , no lesions erythema or exudate. Lungs: Clear to auscultation bilaterally. Non-labored. Heart: Regular rate and rhythm, no murmurs rubs or gallops.  Abdomen: Bowel sounds are normal, nontender, nondistended, no hepatosplenomegaly or masses, no hernia , no rebound or guarding.   Extremities: No lower extremity edema. No clubbing or deformities. Neuro: Alert and oriented x 3.  Grossly intact. Skin: Warm and dry, no jaundice.   Psych: Alert and cooperative, normal mood and affect.   Imaging Studies: No abdominal imaging  Assessment and Plan:   Beverly [name] is a 84 y.o. female with asymptomatic iron deficiency anemia, underwent bidirectional endoscopy which was unremarkable, video capsule endoscopy revealed diminutive nonbleeding small bowel erosion only.  Patient's hemoglobin has been stable and she does not report any symptoms of active GI bleed.  Do not recommend further work-up at this time.  Continue oral iron and recheck CBC, iron studies in 3 months  Follow up in 3 months   Dr Sherri Sear, MD

## 2018-11-12 ENCOUNTER — Other Ambulatory Visit: Payer: Self-pay | Admitting: Family Medicine

## 2018-11-12 DIAGNOSIS — I10 Essential (primary) hypertension: Secondary | ICD-10-CM

## 2018-11-12 DIAGNOSIS — E113553 Type 2 diabetes mellitus with stable proliferative diabetic retinopathy, bilateral: Secondary | ICD-10-CM

## 2018-11-12 NOTE — Telephone Encounter (Signed)
Requested medication (s) are due for refill today: yes  Requested medication (s) are on the active medication list: yes  Last refill:  08/21/2018  Future visit scheduled: yes  Notes to clinic: Last filled by historical provider    Requested Prescriptions  Pending Prescriptions Disp Refills   losartan (COZAAR) 25 MG tablet [Pharmacy Med Name: LOSARTAN POTASSIUM 25 MG TAB] 180 tablet 0    Sig: TAKE 2 TABLETS (50 MG) BY MOUTH EVERY DAY-50 MG TABS ON BACKORDER     Cardiovascular:  Angiotensin Receptor Blockers Failed - 11/12/2018 12:59 AM      Failed - Last BP in normal range    BP Readings from Last 1 Encounters:  11/06/18 (!) 144/75         Passed - Cr in normal range and within 180 days    Creat  Date Value Ref Range Status  11/01/2018 0.69 0.60 - 0.88 mg/dL Final    Comment:    For patients >45 years of age, the reference limit for Creatinine is approximately 13% higher for people identified as African-American. .          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, without long-term current use of insulin [facility])   [facility] Steele Sizer, MD   2 months ago Scar of skin of lip   [facility] Steele Sizer, MD   3 months ago Syncope, unspecified syncope type   [place], FNP   4 months ago Type 2 diabetes mellitus with pressure callus [facility])   [facility] Steele Sizer, MD   8 months ago Type 2 diabetes mellitus with pressure callus [facility])   [facility] Steele Sizer, MD      Future Appointments            In 2 months Vanga, Tally Due, MD  [place]   In 3 months Steele Sizer, MD [facility], [facility]

## 2018-12-18 ENCOUNTER — Other Ambulatory Visit: Payer: Self-pay | Admitting: Gastroenterology

## 2019-01-02 LAB — HM DIABETES EYE EXAM

## 2019-01-07 ENCOUNTER — Encounter: Payer: Self-pay | Admitting: Family Medicine

## 2019-01-10 ENCOUNTER — Other Ambulatory Visit: Payer: Self-pay | Admitting: Family Medicine

## 2019-01-10 DIAGNOSIS — E785 Hyperlipidemia, unspecified: Secondary | ICD-10-CM

## 2019-01-10 DIAGNOSIS — E11628 Type 2 diabetes mellitus with other skin complications: Secondary | ICD-10-CM

## 2019-01-10 DIAGNOSIS — E113553 Type 2 diabetes mellitus with stable proliferative diabetic retinopathy, bilateral: Secondary | ICD-10-CM

## 2019-01-10 DIAGNOSIS — E1169 Type 2 diabetes mellitus with other specified complication: Secondary | ICD-10-CM

## 2019-01-29 ENCOUNTER — Encounter: Payer: Self-pay | Admitting: Gastroenterology

## 2019-01-29 ENCOUNTER — Ambulatory Visit: Payer: Medicare Other | Admitting: Gastroenterology

## 2019-01-29 ENCOUNTER — Encounter (INDEPENDENT_AMBULATORY_CARE_PROVIDER_SITE_OTHER): Payer: Self-pay

## 2019-01-29 ENCOUNTER — Other Ambulatory Visit: Payer: Self-pay

## 2019-01-29 VITALS — BP 144/74 | HR 96 | Temp 98.3°F | Resp 17 | Ht 65.0 in | Wt 186.4 lb

## 2019-01-29 DIAGNOSIS — D509 Iron deficiency anemia, unspecified: Secondary | ICD-10-CM

## 2019-01-29 NOTE — Progress Notes (Signed)
Cephas Darby, MD [address]  [place]  [place], [place] [address]  Main: [phone]  Fax: [phone] Pager: [phone]   Primary Care Physician: Steele Sizer, MD  Primary Gastroenterologist:  Dr. Cephas Darby  Chief Complaint  Patient presents with   Follow-up   Irritable Bowel Syndrome    HPI: Beverly [name] is a 85 y.o. female with history of iron deficiency anemia seen for follow-up.  Patient underwent EGD and colonoscopy which were unremarkable.  Underwent video capsule endoscopy which revealed diminutive nonbleeding erosion in distal small bowel.  Otherwise normal study.  Patient's hemoglobin has been stable and she continues to take oral iron 2 times a day.  She does not have any concerns today.  She denies black stools, abdominal pain, rectal bleeding, weight loss  Follow-up visit 01/29/2019 Beverly [name] continues to feel better from GI standpoint.  She denies melena, rectal bleeding, abdominal pain.  She is taking oral iron 2 times a day along with vitamin C.  Current Outpatient Medications  Medication Sig Dispense Refill   alendronate (FOSAMAX) 35 MG tablet TAKE 1 TABLET BY MOUTH WEEKLY WITH A FULL GLASS OF WATER ON AN EMPTY STOMACH. 12 tablet 1   atorvastatin (LIPITOR) 40 MG tablet Take 1 tablet (40 mg total) by mouth daily. 90 tablet 1   Cholecalciferol (VITAMIN D) 2000 units CAPS Take 1 capsule (2,000 Units total) by mouth daily. 30 capsule 0   DIMETHICONE, TOPICAL, 2 % CREA Apply 1 each topically 2 (two) times daily as needed. 48 g 0   ferrous sulfate 325 (65 FE) MG tablet Take 325 mg by mouth 2 (two) times daily with a meal.      furosemide (LASIX) 40 MG tablet Take 1 tablet (40 mg total) by mouth daily as needed. 90 tablet 0   Glucosamine Sulfate 500 MG TABS Take 1 tablet by mouth once a week.      loratadine (CLARITIN) 10 MG tablet Take 1 tablet (10 mg total) by mouth daily. 90 tablet 1   losartan (COZAAR) 25 MG tablet TAKE 2 TABLETS  (50 MG) BY MOUTH EVERY DAY-50 MG TABS ON BACKORDER 180 tablet 0   metFORMIN (GLUCOPHAGE) 1000 MG tablet Take 1 tablet (1,000 mg total) by mouth 2 (two) times daily with a meal. 180 tablet 1   montelukast (SINGULAIR) 10 MG tablet Take 1 tablet (10 mg total) by mouth at bedtime. 90 tablet 1   Multiple Vitamin (MULTI-VITAMINS) TABS Take by mouth.     Potassium Chloride ER 20 MEQ TBCR Take 10 mEq by mouth daily as needed. With lasix     repaglinide (PRANDIN) 2 MG tablet TAKE 1 TABLET (2 MG TOTAL) BY MOUTH 3 (THREE) TIMES DAILY. 270 tablet 1   vitamin C (ASCORBIC ACID) 500 MG tablet Take 500 mg by mouth daily.     Zinc Acetate 50 MG CAPS Take by mouth daily.      omeprazole (PRILOSEC) 20 MG capsule TAKE 1 CAPSULE (20 MG TOTAL) BY MOUTH 2 (TWO) TIMES DAILY BEFORE A MEAL. 180 capsule 0   potassium chloride SA (KLOR-CON) 20 MEQ tablet Take by mouth.     No current facility-administered medications for this visit.    Allergies as of 01/29/2019 - Review Complete 01/29/2019  Allergen Reaction Noted   Lisinopril  06/02/2015    NSAIDs: None  Antiplts/Anticoagulants/Anti thrombotics: None  GI procedures:  EGD and colonoscopy 09/24/2018 - Normal duodenal bulb and second portion of the duodenum. - Normal  stomach. Biopsied. - Non-obstructing Schatzki ring. - LA Grade A reflux esophagitis.  - Severe diverticulosis in the entire examined colon. There was no evidence of diverticular bleeding. - Non-bleeding external and internal hemorrhoids. - No specimens collected.  DIAGNOSIS:  A. STOMACH, RANDOM; COLD BIOPSY:  - REACTIVE GASTROPATHY.  - NEGATIVE FOR ACTIVE INFLAMMATION AND H PYLORI.  - NEGATIVE FOR INTESTINAL METAPLASIA, DYSPLASIA, AND MALIGNANCY.   B. COLON POLYP, DESCENDING; COLD SNARE:  - TUBULAR ADENOMA.  - NEGATIVE FOR HIGH-GRADE DYSPLASIA AND MALIGNANCY.   Video capsule endoscopy 10/11/2018   ROS:  General: Negative for anorexia, weight loss, fever, chills, fatigue,  weakness. ENT: Negative for hoarseness, difficulty swallowing , nasal congestion. CV: Negative for chest pain, angina, palpitations, dyspnea on exertion, peripheral edema.  Respiratory: Negative for dyspnea at rest, dyspnea on exertion, cough, sputum, wheezing.  GI: See history of present illness. GU:  Negative for dysuria, hematuria, urinary incontinence, urinary frequency, nocturnal urination.  Endo: Negative for unusual weight change.    Physical Examination:   BP (!) 144/74 (BP Location: Left Arm, Patient Position: Sitting, Cuff Size: Large)    Pulse 96    Temp 98.3 F (36.8 C)    Resp 17    Ht 5\' 5"  (1.651 m)    Wt 186 lb 6.4 oz (84.6 kg)    BMI 31.02 kg/m   General: Well-nourished, well-developed in no acute distress.  Eyes: No icterus. Conjunctivae pink. Mouth: Oropharyngeal mucosa moist and pink , no lesions erythema or exudate. Lungs: Clear to auscultation bilaterally. Non-labored. Heart: Regular rate and rhythm, no murmurs rubs or gallops.  Abdomen: Bowel sounds are normal, nontender, nondistended, no hepatosplenomegaly or masses, no hernia , no rebound or guarding.   Extremities: No lower extremity edema. No clubbing or deformities. Neuro: Alert and oriented x 3.  Grossly intact. Skin: Warm and dry, no jaundice.   Psych: Alert and cooperative, normal mood and affect.   Imaging Studies: No abdominal imaging  Assessment and Plan:   Beverly [name] is a 85 y.o. female with asymptomatic iron deficiency anemia, underwent bidirectional endoscopy which was unremarkable, video capsule endoscopy revealed diminutive nonbleeding small bowel erosion only.  Patient's hemoglobin has been stable and she does not report any symptoms of active GI bleed.  Do not recommend further work-up at this time.  Continue oral iron and recheck CBC, iron studies today  Follow up in 6 months or sooner if needed   Dr Sherri Sear, MD

## 2019-01-30 LAB — CBC
Hematocrit: 35.5 % (ref 34.0–46.6)
Hemoglobin: 11.6 g/dL (ref 11.1–15.9)
MCH: 31.5 pg (ref 26.6–33.0)
MCHC: 32.7 g/dL (ref 31.5–35.7)
MCV: 97 fL (ref 79–97)
Platelets: 209 10*3/uL (ref 150–450)
RBC: 3.68 x10E6/uL — ABNORMAL LOW (ref 3.77–5.28)
RDW: 13.4 % (ref 11.7–15.4)
WBC: 5.5 10*3/uL (ref 3.4–10.8)

## 2019-01-30 LAB — IRON,TIBC AND FERRITIN PANEL
Ferritin: 42 ng/mL (ref 15–150)
Iron Saturation: 25 % (ref 15–55)
Iron: 65 ug/dL (ref 27–139)
Total Iron Binding Capacity: 255 ug/dL (ref 250–450)
UIBC: 190 ug/dL (ref 118–369)

## 2019-02-04 ENCOUNTER — Other Ambulatory Visit: Payer: Self-pay | Admitting: Family Medicine

## 2019-02-04 DIAGNOSIS — I10 Essential (primary) hypertension: Secondary | ICD-10-CM

## 2019-02-04 DIAGNOSIS — E113553 Type 2 diabetes mellitus with stable proliferative diabetic retinopathy, bilateral: Secondary | ICD-10-CM

## 2019-02-04 NOTE — Telephone Encounter (Signed)
Requested Prescriptions  Pending Prescriptions Disp Refills  . losartan (COZAAR) 25 MG tablet [Pharmacy Med Name: LOSARTAN POTASSIUM 25 MG TAB] 180 tablet 0    Sig: TAKE 2 TABLETS (50 MG) BY MOUTH EVERY DAY-50 MG TABS ON BACKORDER     Cardiovascular:  Angiotensin Receptor Blockers Failed - 02/04/2019  1:31 PM      Failed - Last BP in normal range    BP Readings from Last 1 Encounters:  01/29/19 (!) 144/74         Passed - Cr in normal range and within 180 days    Creat  Date Value Ref Range Status  11/01/2018 0.69 0.60 - 0.88 mg/dL Final    Comment:    For patients >23 years of age, the reference limit for Creatinine is approximately 13% higher for people identified as African-American. .    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, without long-term current use of insulin [facility])   [facility] Steele Sizer, MD   5 months ago Scar of skin of lip   [facility] [place], Drue Stager, MD   6 months ago Syncope, unspecified syncope type   [place], FNP   7 months ago Type 2 diabetes mellitus with pressure callus [facility])   [facility] Steele Sizer, MD   11 months ago Type 2 diabetes mellitus with pressure callus [facility])   [facility] Steele Sizer, MD      Future Appointments            In 1 month Ancil Boozer, Drue Stager, MD [facility], [facility]

## 2019-02-05 ENCOUNTER — Telehealth: Payer: Self-pay

## 2019-02-05 NOTE — Telephone Encounter (Signed)
-----   Message from Lin Landsman, MD sent at 01/30/2019 12:58 PM EST ----- Please inform patient that her hemoglobin continues to improve and she should continue taking oral iron, cut down to 1 pill a day  RV

## 2019-02-05 NOTE — Telephone Encounter (Signed)
Pt has been notified of results and verbalized understanding  

## 2019-02-17 ENCOUNTER — Other Ambulatory Visit: Payer: Self-pay | Admitting: Family Medicine

## 2019-02-17 DIAGNOSIS — E785 Hyperlipidemia, unspecified: Secondary | ICD-10-CM

## 2019-02-17 DIAGNOSIS — E1169 Type 2 diabetes mellitus with other specified complication: Secondary | ICD-10-CM

## 2019-02-17 NOTE — Telephone Encounter (Signed)
Requested Prescriptions  Pending Prescriptions Disp Refills  . atorvastatin (LIPITOR) 40 MG tablet [Pharmacy Med Name: ATORVASTATIN 40 MG TABLET] 90 tablet 1    Sig: TAKE 1 TABLET BY MOUTH EVERY DAY     Cardiovascular:  Antilipid - Statins Failed - 02/17/2019  9:10 AM      Failed - HDL in normal range and within 360 days    HDL  Date Value Ref Range Status  11/01/2018 48 (L) > OR = 50 mg/dL Final  06/05/2015 51 >39 mg/dL Final         Passed - Total Cholesterol in normal range and within 360 days    Cholesterol, Total  Date Value Ref Range Status  06/05/2015 135 100 - 199 mg/dL Final   Cholesterol  Date Value Ref Range Status  11/01/2018 123 <200 mg/dL Final         Passed - LDL in normal range and within 360 days    LDL Cholesterol (Calc)  Date Value Ref Range Status  11/01/2018 62 mg/dL (calc) Final    Comment:    Reference range: <100 . Desirable range <100 mg/dL for primary prevention;   <70 mg/dL for patients with CHD or diabetic patients  with > or = 2 CHD risk factors. Marland Kitchen LDL-C is now calculated using the Martin-Hopkins  calculation, which is a validated novel method providing  better accuracy than the Friedewald equation in the  estimation of LDL-C.  Cresenciano Genre et al. Annamaria Helling. 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, FNP   7 months ago Type 2 diabetes mellitus with pressure callus [facility])   [facility] Steele Sizer, MD   11 months ago Type 2 diabetes mellitus with pressure callus [facility])   [facility] Steele Sizer, MD      Future Appointments            In 2 weeks Steele Sizer, MD [facility]

## 2019-03-06 ENCOUNTER — Encounter: Payer: Self-pay | Admitting: Family Medicine

## 2019-03-06 ENCOUNTER — Ambulatory Visit: Payer: Medicare Other | Admitting: Family Medicine

## 2019-03-06 ENCOUNTER — Other Ambulatory Visit: Payer: Self-pay

## 2019-03-06 VITALS — BP 140/70 | HR 90 | Temp 96.9°F | Resp 18 | Ht 65.0 in | Wt 183.8 lb

## 2019-03-06 DIAGNOSIS — E1169 Type 2 diabetes mellitus with other specified complication: Secondary | ICD-10-CM

## 2019-03-06 DIAGNOSIS — E113553 Type 2 diabetes mellitus with stable proliferative diabetic retinopathy, bilateral: Secondary | ICD-10-CM

## 2019-03-06 DIAGNOSIS — I10 Essential (primary) hypertension: Secondary | ICD-10-CM

## 2019-03-06 DIAGNOSIS — E785 Hyperlipidemia, unspecified: Secondary | ICD-10-CM

## 2019-03-06 DIAGNOSIS — J3089 Other allergic rhinitis: Secondary | ICD-10-CM

## 2019-03-06 DIAGNOSIS — M8580 Other specified disorders of bone density and structure, unspecified site: Secondary | ICD-10-CM

## 2019-03-06 DIAGNOSIS — E538 Deficiency of other specified B group vitamins: Secondary | ICD-10-CM

## 2019-03-06 DIAGNOSIS — D508 Other iron deficiency anemias: Secondary | ICD-10-CM

## 2019-03-06 DIAGNOSIS — E441 Mild protein-calorie malnutrition: Secondary | ICD-10-CM

## 2019-03-06 LAB — POCT GLYCOSYLATED HEMOGLOBIN (HGB A1C): HbA1c, POC (controlled diabetic range): 6.4 % (ref 0.0–7.0)

## 2019-03-06 MED ORDER — METFORMIN HCL 1000 MG PO TABS: 1000.0000 mg | ORAL_TABLET | Freq: Two times a day (BID) | ORAL | 1 refills | Status: DC

## 2019-03-06 NOTE — Progress Notes (Addendum)
Name: Beverly [name]   MRN: [id number]    DOB: Nov 22, 1934   Date:03/06/2019       Progress Note  Subjective  Chief Complaint  Chief Complaint  Patient presents with  . Diabetes  . Dyslipidemia  . Hypertension  . Medication Refill    HPI   Iron deficiency anemia: she was seen by Dr. Marius Ditch and had EGD and colonoscopy done 09/212020, only one polyp, last CBC and iron storage was back to normal, she is down to one iron pill daily , no pica, no sob. She states her appetite is better, she got used to new dentures  DMII: she has been taking Metformin to 2000 mg daily,takingPrandin three times a day. Fastinglow 100's  andpost- prandially 150-160 Eye exam is up to date, recently seen by podiatrist and he treats her callus  She denies polyphagia or polydipsia but she has nocturia - twice per night - stable. No side effects of medication, no recent hypoglycemic episodes. HgbA1C today is 6.4%   Syncopal episode: seen by cardiologist, Dr. Caryl Comes, echo showed normal EF. Possibly from change in bp medication because of shortage. She has not had any more episodes   HTN:BP isat goal today, no chest pain, no palpitationor dizziness. Taking medication as prescribed, no side effects. Takes Furosemide prn only for severe swelling, discussed compression stocking hoses to control swelling. She states at home bp 120's-70's-80's . Continue current regiment   Hyperlipidemia: taking Atorvastatin, no myalgias.Recheck labs today   AR: well controlled,taking medication prn and is doing well at this time, no rhinorrhea or nasal congestion at this time. Unchanged   OA: she has right knee pain, it started after a fall 35 plus years agosecondary to a fall, she takes arthritis pain pill otc Pain is described as dull and aching, intermittent but is doing well on tylenol. She states recently had some right knee pain, but doing well at this time, controlled with Tylenol arhtritis   Unintentional weight  loss: she hadlost 18 lbsfrom 2018 to 2019.She statesshe wasunder a lot of stress since Summer of 2018 worried about grand-daughteralso her daughter was diagnosed with uterine cancer. Since she lost weight, she got motivated to eat healthier, she feel Summer 2020 and broker her dentures during a Syncopal episode. Explained that we can look for occult cancer but she is still not interested   October 2017 weight was 228 lbs October 2018 weight was 217 lbs October 2019 weight was 207 lbs October 2020 weight is 189 lbs March 2021 weight is 183 lbs  Syncope: seen by cardiologist and they suggested to get heart monitor but she states she did not want it.   Right kidney stone: seen by nephrologist, had a renal US that showed 6 mm non obstructing stone, no pain at this time or hematuria. We will continue to monitor    Patient Active Problem List   Diagnosis Date Noted  . Iron deficiency anemia   . Subdural hematoma ([place])   . Syncope and collapse 07/24/2018  . 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 Type 2 diabetes mellitus with pressure callus ([place]) 10/24/2017  . Diabetic retinopathy ([place]) 11/12/2015  . Obesity (BMI 30.0-34.9) 06/02/2015  . Osteoarthritis 06/02/2015  . Osteopenia 06/02/2015  . Narrowing of intervertebral disc space 06/02/2015  . Dyslipidemia associated with type 2 diabetes mellitus ([place]) 06/02/2015  . Perennial allergic rhinitis 02/28/2007  . Benign essential HTN 06/26/2006  .  Pure hypercholesterolemia 06/26/2006    Past Surgical History:  Procedure Laterality Date  . ABDOMINAL HYSTERECTOMY    . BUNIONECTOMY    . COLONOSCOPY    . COLONOSCOPY WITH PROPOFOL N/A 09/24/2018   Procedure: COLONOSCOPY WITH PROPOFOL;  Surgeon: Lin Landsman, MD;  Location[facility] ENDOSCOPY;  Service: Gastroenterology;  Laterality: N/A;  . ESOPHAGOGASTRODUODENOSCOPY (EGD) WITH  PROPOFOL N/A 09/24/2018   Procedure: ESOPHAGOGASTRODUODENOSCOPY (EGD) WITH PROPOFOL;  Surgeon: Lin Landsman, MD;  Location: [facility] ENDOSCOPY;  Service: Gastroenterology;  Laterality: N/A;  . GIVENS CAPSULE STUDY N/A 10/11/2018   Procedure: GIVENS CAPSULE STUDY;  Surgeon: Lin Landsman, MD;  Location: [facility] ENDOSCOPY;  Service: Gastroenterology;  Laterality: N/A;  . HERNIA REPAIR      Family History  Problem Relation Age of Onset  . Hypertension Mother   . Healthy Father   . Healthy Daughter   . Raynaud syndrome Daughter   . Hypertension Daughter   . Stroke Sister     Social History   Tobacco Use  . Smoking status: Never Smoker  . Smokeless tobacco: Never Used  . Tobacco comment: smoking cessation materials not required  Substance Use Topics  . Alcohol use: No    Alcohol/week: 0.0 standard drinks     Current Outpatient Medications:  .  alendronate (FOSAMAX) 35 MG tablet, TAKE 1 TABLET BY MOUTH WEEKLY WITH A FULL GLASS OF WATER ON AN EMPTY STOMACH., Disp: 12 tablet, Rfl: 1 .  atorvastatin (LIPITOR) 40 MG tablet, TAKE 1 TABLET BY MOUTH EVERY DAY, Disp: 90 tablet, Rfl: 1 .  Cholecalciferol (VITAMIN D) 2000 units CAPS, Take 1 capsule (2,000 Units total) by mouth daily., Disp: 30 capsule, Rfl: 0 .  DIMETHICONE, TOPICAL, 2 % CREA, Apply 1 each topically 2 (two) times daily as needed., Disp: 48 g, Rfl: 0 .  ferrous sulfate 325 (65 FE) MG tablet, Take 325 mg by mouth 2 (two) times daily with a meal. , Disp: , Rfl:  .  furosemide (LASIX) 40 MG tablet, Take 1 tablet (40 mg total) by mouth daily as needed., Disp: 90 tablet, Rfl: 0 .  Glucosamine Sulfate 500 MG TABS, Take 1 tablet by mouth once a week. , Disp: , Rfl:  .  loratadine (CLARITIN) 10 MG tablet, Take 1 tablet (10 mg total) by mouth daily., Disp: 90 tablet, Rfl: 1 .  losartan (COZAAR) 25 MG tablet, TAKE 2 TABLETS (50 MG) BY MOUTH EVERY DAY-50 MG TABS ON BACKORDER, Disp: 180 tablet, Rfl: 0 .  metFORMIN (GLUCOPHAGE) 1000 MG  tablet, Take 1 tablet (1,000 mg total) by mouth 2 (two) times daily with a meal., Disp: 180 tablet, Rfl: 1 .  montelukast (SINGULAIR) 10 MG tablet, Take 1 tablet (10 mg total) by mouth at bedtime., Disp: 90 tablet, Rfl: 1 .  Multiple Vitamin (MULTI-VITAMINS) TABS, Take by mouth., Disp: , Rfl:  .  omeprazole (PRILOSEC) 20 MG capsule, TAKE 1 CAPSULE (20 MG TOTAL) BY MOUTH 2 (TWO) TIMES DAILY BEFORE A MEAL., Disp: 180 capsule, Rfl: 0 .  Potassium Chloride ER 20 MEQ TBCR, Take 10 mEq by mouth daily as needed. With lasix, Disp: , Rfl:  .  repaglinide (PRANDIN) 2 MG tablet, TAKE 1 TABLET (2 MG TOTAL) BY MOUTH 3 (THREE) TIMES DAILY., Disp: 270 tablet, Rfl: 1 .  vitamin C (ASCORBIC ACID) 500 MG tablet, Take 500 mg by mouth daily., Disp: , Rfl:  .  Zinc Acetate 50 MG CAPS, Take by mouth daily. , Disp: , Rfl:  .  potassium chloride SA (KLOR-CON) 20 MEQ tablet, Take by mouth., Disp: , Rfl:   Allergies  Allergen Reactions  . Lisinopril     I personally reviewed active problem list, medication list, allergies, family history, social history, health maintenance with the patient/caregiver today.   ROS  Constitutional: Negative for fever , positive for mild weight change.  Respiratory: Negative for cough and shortness of breath.   Cardiovascular: Negative for chest pain or palpitations.  Gastrointestinal: Negative for abdominal pain, no bowel changes.  Musculoskeletal: Negative for gait problem ( but careful with her steps to prevent falls)  or joint swelling.  Skin: Negative for rash.  Neurological: Negative for dizziness or headache.  No other specific complaints in a complete review of systems (except as listed in HPI above).  Objective  Vitals:   03/06/19 1045  BP: 140/70  Pulse: 90  Resp: 18  Temp: (!) 96.9 F (36.1 C)  TempSrc: Temporal  SpO2: 99%  Weight: 183 lb 12.8 oz (83.4 kg)  Height: 5\' 5"  (1.651 m)    Body mass index is 30.59 kg/m.  Physical Exam  Constitutional: Patient  appears well-developed and well-nourished. Obese  No distress.  HEENT: head atraumatic, normocephalic, pupils equal and reactive to light Cardiovascular: Normal rate, regular rhythm and normal heart sounds.  2/6 systolic  murmur heard. No BLE edema. Pulmonary/Chest: Effort normal and breath sounds normal. No respiratory distress. Abdominal: Soft.  There is no tenderness. Psychiatric: Patient has a normal mood and affect. behavior is normal. Judgment and thought content normal. Muscular Skeletal: crepitus with extension of both knees   Recent Results (from the past 2160 hour(s))  HM DIABETES EYE EXAM     Status: Abnormal   Collection Time: 01/02/19 12:00 AM  Result Value Ref Range   HM Diabetic Eye Exam Retinopathy (A) No Retinopathy    Comment: [facility], Dr. Lemar Lofty laser treatment  Iron, TIBC and Ferritin Panel     Status: None   Collection Time: 01/29/19 10:43 AM  Result Value Ref Range   Total Iron Binding Capacity 255 250 - 450 ug/dL   UIBC 190 118 - 369 ug/dL   Iron 65 27 - 139 ug/dL   Iron Saturation 25 15 - 55 %   Ferritin 42 15 - 150 ng/mL  CBC     Status: Abnormal   Collection Time: 01/29/19 10:43 AM  Result Value Ref Range   WBC 5.5 3.4 - 10.8 x10E3/uL   RBC 3.68 (L) 3.77 - 5.28 x10E6/uL   Hemoglobin 11.6 11.1 - 15.9 g/dL   Hematocrit 35.5 34.0 - 46.6 %   MCV 97 79 - 97 fL   MCH 31.5 26.6 - 33.0 pg   MCHC 32.7 31.5 - 35.7 g/dL   RDW 13.4 11.7 - 15.4 %   Platelets 209 150 - 450 x10E3/uL  POCT HgB A1C     Status: Normal   Collection Time: 03/06/19 10:50 AM  Result Value Ref Range   Hemoglobin A1C     HbA1c POC (<> result, manual entry)     HbA1c, POC (prediabetic range)     HbA1c, POC (controlled diabetic range) 6.4 0.0 - 7.0 %      PHQ2/9: Depression screen [facility] 2/9 03/06/2019 11/01/2018 09/05/2018 08/06/2018 07/02/2018  Decreased Interest 1 0 0 0 1  Down, Depressed, Hopeless 0 0 0 0 0  PHQ - 2 Score 1 0 0 0 1  Altered sleeping 0 0 0 0 0  Tired,  decreased energy 0  0 0 0 0  Change in appetite 0 0 0 0 0  Feeling bad or failure about yourself  0 0 0 0 0  Trouble concentrating - 0 0 0 0  Moving slowly or fidgety/restless 0 0 0 0 0  Suicidal thoughts 0 0 0 0 0  PHQ-9 Score 1 0 0 0 1  Difficult doing work/chores - Not difficult at all - - Not difficult at all  Some recent data might be hidden    phq 9 is positive   Fall Risk: Fall Risk  03/06/2019 03/06/2019 11/01/2018 09/05/2018 08/06/2018  Falls in the past year? 1 0 1 1 1   Number falls in past yr: 0 0 0 0 0  Injury with Fall? 1 0 1 1 0  Comment hurt her knees, dentures - - - -  Risk for fall due to : - - - Other (Comment) -  Risk for fall due to: Comment - - - Syncopy -     Functional Status Survey: Is the patient deaf or have difficulty hearing?: No Does the patient have difficulty seeing, even when wearing glasses/contacts?: No Does the patient have difficulty concentrating, remembering, or making decisions?: No Does the patient have difficulty walking or climbing stairs?: No Does the patient have difficulty dressing or bathing?: No Does the patient have difficulty doing errands alone such as visiting a doctor's office or shopping?: No    Assessment & Plan  1. 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  Dispense: 180 tablet; Refill: 1  2. Dyslipidemia  On statin therapy   3. Dyslipidemia associated with type 2 diabetes mellitus (HCC)  - metFORMIN (GLUCOPHAGE) 1000 MG tablet; Take 1 tablet (1,000 mg total) by mouth 2 (two) times daily with a meal.  Dispense: 180 tablet; Refill: 1  4. Benign essential HTN  At goal, continue medication   5. Perennial allergic rhinitis  Symptoms controlled at this time  6. Other iron deficiency anemia  Still taking iron  supplementation   7. Mild protein-calorie malnutrition ([place])  She is still losing weight, discussed protein shakes   8. B12 deficiency  Take supplementation   9. Osteopenia, unspecified location  Continue Alendronate

## 2019-03-06 NOTE — Progress Notes (Signed)
6.4

## 2019-03-16 ENCOUNTER — Other Ambulatory Visit: Payer: Self-pay | Admitting: Family Medicine

## 2019-03-16 DIAGNOSIS — J3089 Other allergic rhinitis: Secondary | ICD-10-CM

## 2019-03-16 NOTE — Telephone Encounter (Signed)
Requested Prescriptions  Pending Prescriptions Disp Refills  . 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

## 2019-03-30 ENCOUNTER — Other Ambulatory Visit: Payer: Self-pay | Admitting: Family Medicine

## 2019-03-30 DIAGNOSIS — M8580 Other specified disorders of bone density and structure, unspecified site: Secondary | ICD-10-CM

## 2019-03-30 NOTE — Telephone Encounter (Signed)
Requested Prescriptions  Pending Prescriptions Disp Refills  . alendronate (FOSAMAX) 35 MG tablet [Pharmacy Med Name: ALENDRONATE SODIUM 35 MG TAB] 12 tablet 1    Sig: TAKE 1 TABLET BY MOUTH WEEKLY WITH A FULL GLASS OF WATER ON AN EMPTY STOMACH.     Endocrinology:  Bisphosphonates Failed - 03/30/2019  9:43 AM      Failed - Vitamin D in normal range and within 360 days    Vit D, 25-Hydroxy  Date Value Ref Range Status  06/05/2015 44.7 30.0 - 100.0 ng/mL Final    Comment:    Vitamin D deficiency has been defined by the [place] practice guideline as a level of serum 25-OH vitamin D less than 20 ng/mL (1,2). The Endocrine Society went on to further define vitamin D insufficiency as a level between 21 and 29 ng/mL (2). 1. IOM (Institute of Medicine). 2010. Dietary reference    intakes for calcium and D. [place]: The    [employer]. 2. Holick MF, Binkley Pleak, Bischoff-Ferrari HA, et al.    Evaluation, treatment, and prevention of vitamin D    deficiency: an Endocrine Society clinical practice    guideline. JCEM. 2011 Jul; 96(7):1911-30.          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, without long-term current use of insulin [facility])   [facility] Steele Sizer, MD   6 months ago Scar of skin of lip   [facility] [place], Drue Stager, MD   7 months ago Syncope, unspecified syncope type   [place], FNP    9 months ago Type 2 diabetes mellitus with pressure callus [facility])   [facility] Steele Sizer, MD      Future Appointments            In 3 months Steele Sizer, MD [facility], [facility]           '

## 2019-03-31 ENCOUNTER — Other Ambulatory Visit: Payer: Self-pay | Admitting: Gastroenterology

## 2019-04-16 ENCOUNTER — Other Ambulatory Visit: Payer: Self-pay

## 2019-04-16 ENCOUNTER — Ambulatory Visit (INDEPENDENT_AMBULATORY_CARE_PROVIDER_SITE_OTHER): Payer: Medicare Other

## 2019-04-16 VITALS — Ht 65.0 in | Wt 183.0 lb

## 2019-04-16 DIAGNOSIS — Z0000 Encounter for general adult medical examination without abnormal findings: Secondary | ICD-10-CM

## 2019-04-16 NOTE — Progress Notes (Signed)
Subjective:   Beverly [name] is a 85 y.o. female who presents for Medicare Annual (Subsequent) preventive examination.  Virtual Visit via Telephone Note  I connected with Beverly [name] on 04/16/19 at  9:20 AM EDT by telephone and verified that I am speaking with the correct person using two identifiers.  Medicare Annual Wellness visit completed telephonically due to Covid-19 pandemic.   Location: Patient: home Provider: office   I discussed the limitations, risks, security and privacy concerns of performing an evaluation and management service by telephone and the availability of in person appointments. The patient expressed understanding and agreed to proceed.  Some vital signs may be absent or patient reported.   Beverly Marker, LPN    Review of Systems:   Cardiac Risk Factors include: advanced age (>80men, >34 women);diabetes mellitus;dyslipidemia;hypertension;obesity (BMI >30kg/m2)     Objective:     Vitals: Ht 5\' 5"  (1.651 m)   Wt 183 lb (83 kg)   BMI 30.45 kg/m   Body mass index is 30.45 kg/m.  Advanced Directives 04/16/2019 09/24/2018 07/24/2018 03/28/2017 06/14/2016 04/11/2016 02/09/2016  Does Patient Have a Medical Advance Directive? Yes Yes No Yes Yes Yes Yes  Type of Paramedic of [place];Living will [place];Living will - [place];Living will [place];Living will Living will;Healthcare Power of [place];Living will  Does patient want to make changes to medical advance directive? - - - Yes (MAU/Ambulatory/Procedural Areas - Information given) - - -  Copy of [place] in Chart? No - copy requested No - copy requested - No - copy requested Yes Yes -  Would patient like information on creating a medical advance directive? - - No - Patient declined - - - -    Tobacco Social History   Tobacco Use  Smoking Status Never Smoker  Smokeless  Tobacco Never Used  Tobacco Comment   smoking cessation materials not required     Counseling given: Not Answered Comment: smoking cessation materials not required   Clinical Intake:  Pre-visit preparation completed: Yes  Pain : No/denies pain     BMI - recorded: 30.45 Nutritional Status: BMI > 30  Obese Nutritional Risks: None Diabetes: Yes CBG done?: No CBG resulted in Enter/ Edit results?: No Did pt. bring in CBG monitor from home?: No   Nutrition Risk Assessment:  Has the patient had any N/V/D within the last 2 months?  No  Does the patient have any non-healing wounds?  No  Has the patient had any unintentional weight loss or weight gain?  No   Diabetes:  Is the patient diabetic?  Yes  If diabetic, was a CBG obtained today?  No  Did the patient bring in their glucometer from home?  No  How often do you monitor your CBG's? 3-4 times per week.   Financial Strains and Diabetes Management:  Are you having any financial strains with the device, your supplies or your medication? No .  Does the patient want to be seen by Chronic Care Management for management of their diabetes?  No  Would the patient like to be referred to a Nutritionist or for Diabetic Management?  No   Diabetic Exams:  Diabetic Eye Exam: Completed 01/02/19 positive retinopathy.  Diabetic Foot Exam: Completed 10/29/18.   How often do you need to have someone help you when you read instructions, pamphlets, or other written materials from your doctor or pharmacy?: 1 - Never  Interpreter Needed?: No  Information entered by :: Beverly Marker LPN  Past Medical History:  Diagnosis Date  . Allergy   . Arthritis   . Diabetes mellitus without complication ([place])   . Hyperlipidemia   . Hypertension    Past Surgical History:  Procedure Laterality Date  . ABDOMINAL HYSTERECTOMY    . BUNIONECTOMY    . COLONOSCOPY    . COLONOSCOPY WITH PROPOFOL N/A 09/24/2018   Procedure: COLONOSCOPY WITH PROPOFOL;   Surgeon: Beverly Landsman, MD;  Location: [facility] ENDOSCOPY;  Service: Gastroenterology;  Laterality: N/A;  . ESOPHAGOGASTRODUODENOSCOPY (EGD) WITH PROPOFOL N/A 09/24/2018   Procedure: ESOPHAGOGASTRODUODENOSCOPY (EGD) WITH PROPOFOL;  Surgeon: Beverly Landsman, MD;  Location: [facility] ENDOSCOPY;  Service: Gastroenterology;  Laterality: N/A;  . GIVENS CAPSULE STUDY N/A 10/11/2018   Procedure: GIVENS CAPSULE STUDY;  Surgeon: Beverly Landsman, MD;  Location: [facility] ENDOSCOPY;  Service: Gastroenterology;  Laterality: N/A;  . HERNIA REPAIR     Family History  Problem Relation Age of Onset  . Hypertension Mother   . Healthy Father   . Healthy Daughter   . Raynaud syndrome Daughter   . Hypertension Daughter   . Stroke Sister    Social History   Socioeconomic History  . Marital status: Widowed    Spouse name: Beverly [name]  . Number of children: 2  . Years of education: Not on file  . Highest education level: Bachelor's degree (e.g., BA, AB, BS)  Occupational History  . Occupation: Retired  Tobacco Use  . Smoking status: Never Smoker  . Smokeless tobacco: Never Used  . Tobacco comment: smoking cessation materials not required  Substance and Sexual Activity  . Alcohol use: No    Alcohol/week: 0.0 standard drinks  . Drug use: No  . Sexual activity: Not Currently  Other Topics Concern  . Not on file  Social History Narrative  . Not on file   Social Determinants of Health   Financial Resource Strain: Low Risk   . Difficulty of Paying Living Expenses: Not hard at all  Food Insecurity: No Food Insecurity  . Worried About Charity fundraiser in the Last Year: Never true  . Ran Out of Food in the Last Year: Never true  Transportation Needs: No Transportation Needs  . Lack of Transportation (Medical): No  . Lack of Transportation (Non-Medical): No  Physical Activity: Insufficiently Active  . Days of Exercise per Week: 3 days  . Minutes of Exercise per Session: 30 min  Stress: No Stress  Concern Present  . Feeling of Stress : Not at all  Social Connections: Unknown  . Frequency of Communication with Friends and Family: Patient refused  . Frequency of Social Gatherings with Friends and Family: Patient refused  . Attends Religious Services: Patient refused  . Active Member of Clubs or Organizations: Patient refused  . Attends Archivist Meetings: Patient refused  . Marital Status: Widowed    Outpatient Encounter Medications as of 04/16/2019  Medication Sig  . alendronate (FOSAMAX) 35 MG tablet TAKE 1 TABLET BY MOUTH WEEKLY WITH A FULL GLASS OF WATER ON AN EMPTY STOMACH.  Marland Kitchen atorvastatin (LIPITOR) 40 MG tablet TAKE 1 TABLET BY MOUTH EVERY DAY  . Cholecalciferol (VITAMIN D) 2000 units CAPS Take 1 capsule (2,000 Units total) by mouth daily.  . ferrous sulfate 325 (65 FE) MG tablet Take 325 mg by mouth 2 (two) times daily with a meal.   . furosemide (LASIX) 40 MG tablet Take 1 tablet (40 mg total)  by mouth daily as needed.  . Glucosamine Sulfate 500 MG TABS Take 1 tablet by mouth once a week.   . loratadine (CLARITIN) 10 MG tablet Take 1 tablet (10 mg total) by mouth daily.  Marland Kitchen losartan (COZAAR) 25 MG tablet TAKE 2 TABLETS (50 MG) BY MOUTH EVERY DAY-50 MG TABS ON BACKORDER  . metFORMIN (GLUCOPHAGE) 1000 MG tablet Take 1 tablet (1,000 mg total) by mouth 2 (two) times daily with a meal.  . montelukast (SINGULAIR) 10 MG tablet TAKE 1 TABLET BY MOUTH EVERYDAY AT BEDTIME  . Multiple Vitamin (MULTI-VITAMINS) TABS Take by mouth.  Marland Kitchen omeprazole (PRILOSEC) 20 MG capsule Take 1 capsule (20 mg total) by mouth 2 (two) times daily before a meal.  . Potassium Chloride ER 20 MEQ TBCR Take 10 mEq by mouth daily as needed. With lasix  . repaglinide (PRANDIN) 2 MG tablet TAKE 1 TABLET (2 MG TOTAL) BY MOUTH 3 (THREE) TIMES DAILY.  . vitamin B-12 (CYANOCOBALAMIN) 1000 MCG tablet Take 1,000 mcg by mouth 3 (three) times a week.  . vitamin C (ASCORBIC ACID) 500 MG tablet Take 500 mg by mouth  daily.  . vitamin e 100 UNT/0.25ML OIL oil Take by mouth daily.  . Zinc Acetate 50 MG CAPS Take by mouth daily.   . [DISCONTINUED] DIMETHICONE, TOPICAL, 2 % CREA Apply 1 each topically 2 (two) times daily as needed.  . [DISCONTINUED] potassium chloride SA (KLOR-CON) 20 MEQ tablet Take by mouth.   No facility-administered encounter medications on file as of 04/16/2019.    Activities of Daily Living In your present state of health, do you have any difficulty performing the following activities: 04/16/2019 03/06/2019  Hearing? N N  Comment declines hearing aids -  Vision? N N  Difficulty concentrating or making decisions? N N  Walking or climbing stairs? N N  Dressing or bathing? N N  Doing errands, shopping? N N  Preparing Food and eating ? N -  Using the Toilet? N -  In the past six months, have you accidently leaked urine? N -  Do you have problems with loss of bowel control? N -  Managing your Medications? N -  Managing your Finances? N -  Housekeeping or managing your Housekeeping? N -  Some recent data might be hidden    Patient Care Team: Steele Sizer, MD as PCP - General (Family Medicine) Sharlotte Alamo, DPM as Consulting Physician (Podiatry)    Assessment:   This is a routine wellness examination for [facility].  Exercise Activities and Dietary recommendations Current Exercise Habits: Home exercise routine, Type of exercise: calisthenics;Other - see comments(NuStep), Time (Minutes): 30, Frequency (Times/Week): 3, Weekly Exercise (Minutes/Week): 90, Intensity: Moderate, Exercise limited by: None identified  Goals    . DIET - INCREASE WATER INTAKE     Recommend to drink at least 6-8 8oz glasses of water per day.    . Eat more vegetables     Recommend eating at least 2 servings of vegetables a day.       Fall Risk Fall Risk  04/16/2019 03/06/2019 03/06/2019 11/01/2018 09/05/2018  Falls in the past year? 1 1 0 1 1  Number falls in past yr: 0 0 0 0 0  Injury with Fall? 1 1 0 1 1   Comment - hurt her knees, dentures - - -  Risk for fall due to : History of fall(s) - - - Other (Comment)  Risk for fall due to: Comment - - - - Syncopy  Follow  up Falls prevention discussed - - - -   FALL RISK PREVENTION PERTAINING TO THE HOME:  Any stairs in or around the home? Yes  If so, do they handrails? Yes   Home free of loose throw rugs in walkways, pet beds, electrical cords, etc? Yes  Adequate lighting in your home to reduce risk of falls? Yes   ASSISTIVE DEVICES UTILIZED TO PREVENT FALLS:  Life alert? No  Use of a cane, walker or w/c? No  Grab bars in the bathroom? Yes  Shower chair or bench in shower? Yes  Elevated toilet seat or a handicapped toilet? Yes   DME ORDERS:  DME order needed?  No   TIMED UP AND GO:  Was the test performed? No . Telephonic visit.  Education: Fall risk prevention has been discussed.  Intervention(s) required? No   Depression Screen PHQ 2/9 Scores 04/16/2019 03/06/2019 11/01/2018 09/05/2018  PHQ - 2 Score 0 1 0 0  PHQ- 9 Score - 1 0 0     Cognitive Function     6CIT Screen 04/16/2019 03/28/2017 04/11/2016  What Year? 0 points 0 points 0 points  What month? 0 points 0 points 0 points  What time? 0 points 0 points 0 points  Count back from 20 0 points 0 points 0 points  Months in reverse 0 points 0 points 0 points  Repeat phrase 0 points 0 points 0 points  Total Score 0 0 0    Immunization History  Administered Date(s) Administered  . Fluad Quad(high Dose 65+) 09/05/2018  . Influenza Split 01/27/2003, 10/16/2006, 10/25/2007, 09/03/2009  . Influenza, High Dose Seasonal PF 10/06/2015, 09/25/2017  . Influenza, Seasonal, Injecte, Preservative Fre 09/28/2010, 10/03/2011  . Influenza,inj,Quad PF,6+ Mos 09/17/2013, 10/14/2016  . Influenza-Unspecified 09/17/2013  . PFIZER SARS-COV-2 Vaccination 01/24/2019, 02/14/2019  . Pneumococcal Conjugate-13 09/17/2013  . Pneumococcal Polysaccharide-23 01/03/2001, 11/01/2012  . Tdap 05/25/2010,  07/24/2018  . Zoster 05/26/2010    Qualifies for Shingles Vaccine? Yes  Zostavax completed 2012. Due for Shingrix. Education has been provided regarding the importance of this vaccine. Pt has been advised to call insurance company to determine out of pocket expense. Advised may also receive vaccine at local pharmacy or Health Dept. Verbalized acceptance and understanding.  Tdap: Up to date  Flu Vaccine: Up to date  Pneumococcal Vaccine: Up to date    Screening Tests Health Maintenance  Topic Date Due  . INFLUENZA VACCINE  08/04/2019  . HEMOGLOBIN A1C  09/06/2019  . FOOT EXAM  10/29/2019  . OPHTHALMOLOGY EXAM  01/02/2020  . TETANUS/TDAP  07/23/2028  . DEXA SCAN  Completed  . PNA vac Low Risk Adult  Completed    Cancer Screenings:  Colorectal Screening: Completed 09/24/18. Pt awaiting repeat screening recommendations.   Mammogram: Completed 08/28/12. No longer required.   Bone Density: Completed 11/06/13. Results reflect unavailable. Repeat every 2 years. Pt declines repeat screening at this time.   Lung Cancer Screening: (Low Dose CT Chest recommended if Age 67-80 years, 30 pack-year currently smoking OR have quit w/in 15years.) does not qualify.   Additional Screening:  Hepatitis C Screening: no longer required  Vision Screening: Recommended annual ophthalmology exams for early detection of glaucoma and other disorders of the eye. Is the patient up to date with their annual eye exam?  Yes  Who is the provider or what is the name of the office in which the pt attends annual eye exams? [facility]  Dental Screening: Recommended annual dental exams for proper  oral hygiene  Community Resource Referral:  CRR required this visit?  No      Plan:    I have personally reviewed and addressed the Medicare Annual Wellness questionnaire and have noted the following in the patient's chart:  A. Medical and social history B. Use of alcohol, tobacco or illicit drugs   C. Current medications and supplements D. Functional ability and status E.  Nutritional status F.  Physical activity G. Advance directives H. List of other physicians I.  Hospitalizations, surgeries, and ER visits in previous 12 months J.  [place] such as hearing and vision if needed, cognitive and depression L. Referrals and appointments   In addition, I have reviewed and discussed with patient certain preventive protocols, quality metrics, and best practice recommendations. A written personalized care plan for preventive services as well as general preventive health recommendations were provided to patient.   Signed,  Beverly Marker, LPN Nurse Health Advisor   Nurse Notes: none

## 2019-04-16 NOTE — Patient Instructions (Signed)
Beverly [name] , Thank you for taking time to come for your Medicare Wellness Visit. I appreciate your ongoing commitment to your health goals. Please review the following plan we discussed and let me know if I can assist you in the future.   Screening recommendations/referrals: Colonoscopy: done 09/24/18 Mammogram: done 08/28/12 Bone Density: done 11/06/13 Recommended yearly ophthalmology/optometry visit for glaucoma screening and checkup Recommended yearly dental visit for hygiene and checkup  Vaccinations: Influenza vaccine: done 09/05/18 Pneumococcal vaccine: done 09/17/13 Tdap vaccine: done 07/24/18 Shingles vaccine: Shingrix discussed. Please contact your pharmacy for coverage information.  Covid-19: done 01/24/19 & 02/14/19  Advanced directives: Please bring a copy of your health care power of attorney and living will to the office at your convenience.  Conditions/risks identified: Keep up the great work!  Next appointment: Please follow up in one year for your Medicare Annual Wellness visit.     Preventive Care 83 Years and Older, Female Preventive care refers to lifestyle choices and visits with your health care provider that can promote health and wellness. What does preventive care include?  A yearly physical exam. This is also called an annual well check.  Dental exams once or twice a year.  Routine eye exams. Ask your health care provider how often you should have your eyes checked.  Personal lifestyle choices, including:  Daily care of your teeth and gums.  Regular physical activity.  Eating a healthy diet.  Avoiding tobacco and drug use.  Limiting alcohol use.  Practicing safe sex.  Taking low-dose aspirin every day.  Taking vitamin and mineral supplements as recommended by your health care provider. What happens during an annual well check? The services and screenings done by your health care provider during your annual well check will depend on your age,  overall health, lifestyle risk factors, and family history of disease. Counseling  Your health care provider may ask you questions about your:  Alcohol use.  Tobacco use.  Drug use.  Emotional well-being.  Home and relationship well-being.  Sexual activity.  Eating habits.  History of falls.  Memory and ability to understand (cognition).  Work and work Statistician.  Reproductive health. Screening  You may have the following tests or measurements:  Height, weight, and BMI.  Blood pressure.  Lipid and cholesterol levels. These may be checked every 5 years, or more frequently if you are over 34 years old.  Skin check.  Lung cancer screening. You may have this screening every year starting at age 3 if you have a 30-pack-year history of smoking and currently smoke or have quit within the past 15 years.  Fecal occult blood test (FOBT) of the stool. You may have this test every year starting at age 39.  Flexible sigmoidoscopy or colonoscopy. You may have a sigmoidoscopy every 5 years or a colonoscopy every 10 years starting at age 68.  Hepatitis C blood test.  Hepatitis B blood test.  Sexually transmitted disease (STD) testing.  Diabetes screening. This is done by checking your blood sugar (glucose) after you have not eaten for a while (fasting). You may have this done every 1-3 years.  Bone density scan. This is done to screen for osteoporosis. You may have this done starting at age 42.  Mammogram. This may be done every 1-2 years. Talk to your health care provider about how often you should have regular mammograms. Talk with your health care provider about your test results, treatment options, and if necessary, the need for more tests. Vaccines  Your health care provider may recommend certain vaccines, such as:  Influenza vaccine. This is recommended every year.  Tetanus, diphtheria, and acellular pertussis (Tdap, Td) vaccine. You may need a Td booster every 10  years.  Zoster vaccine. You may need this after age 65.  Pneumococcal 13-valent conjugate (PCV13) vaccine. One dose is recommended after age 14.  Pneumococcal polysaccharide (PPSV23) vaccine. One dose is recommended after age 45. Talk to your health care provider about which screenings and vaccines you need and how often you need them. This information is not intended to replace advice given to you by your health care provider. Make sure you discuss any questions you have with your health care provider. Document Released: 01/16/2015 Document Revised: 09/09/2015 Document Reviewed: 10/21/2014 Elsevier Interactive Patient Education  2017 [place] Prevention in the Home Falls can cause injuries. They can happen to people of all ages. There are many things you can do to make your home safe and to help prevent falls. What can I do on the outside of my home?  Regularly fix the edges of walkways and driveways and fix any cracks.  Remove anything that might make you trip as you walk through a door, such as a raised step or threshold.  Trim any bushes or trees on the path to your home.  Use bright outdoor lighting.  Clear any walking paths of anything that might make someone trip, such as rocks or tools.  Regularly check to see if handrails are loose or broken. Make sure that both sides of any steps have handrails.  Any raised decks and porches should have guardrails on the edges.  Have any leaves, snow, or ice cleared regularly.  Use sand or salt on walking paths during winter.  Clean up any spills in your garage right away. This includes oil or grease spills. What can I do in the bathroom?  Use night lights.  Install grab bars by the toilet and in the tub and shower. Do not use towel bars as grab bars.  Use non-skid mats or decals in the tub or shower.  If you need to sit down in the shower, use a plastic, non-slip stool.  Keep the floor dry. Clean up any water that  spills on the floor as soon as it happens.  Remove soap buildup in the tub or shower regularly.  Attach bath mats securely with double-sided non-slip rug tape.  Do not have throw rugs and other things on the floor that can make you trip. What can I do in the bedroom?  Use night lights.  Make sure that you have a light by your bed that is easy to reach.  Do not use any sheets or blankets that are too big for your bed. They should not hang down onto the floor.  Have a firm chair that has side arms. You can use this for support while you get dressed.  Do not have throw rugs and other things on the floor that can make you trip. What can I do in the kitchen?  Clean up any spills right away.  Avoid walking on wet floors.  Keep items that you use a lot in easy-to-reach places.  If you need to reach something above you, use a strong step stool that has a grab bar.  Keep electrical cords out of the way.  Do not use floor polish or wax that makes floors slippery. If you must use wax, use non-skid floor wax.  Do  not have throw rugs and other things on the floor that can make you trip. What can I do with my stairs?  Do not leave any items on the stairs.  Make sure that there are handrails on both sides of the stairs and use them. Fix handrails that are broken or loose. Make sure that handrails are as long as the stairways.  Check any carpeting to make sure that it is firmly attached to the stairs. Fix any carpet that is loose or worn.  Avoid having throw rugs at the top or bottom of the stairs. If you do have throw rugs, attach them to the floor with carpet tape.  Make sure that you have a light switch at the top of the stairs and the bottom of the stairs. If you do not have them, ask someone to add them for you. What else can I do to help prevent falls?  Wear shoes that:  Do not have high heels.  Have rubber bottoms.  Are comfortable and fit you well.  Are closed at the  toe. Do not wear sandals.  If you use a stepladder:  Make sure that it is fully opened. Do not climb a closed stepladder.  Make sure that both sides of the stepladder are locked into place.  Ask someone to hold it for you, if possible.  Clearly mark and make sure that you can see:  Any grab bars or handrails.  First and last steps.  Where the edge of each step is.  Use tools that help you move around (mobility aids) if they are needed. These include:  Canes.  Walkers.  Scooters.  Crutches.  Turn on the lights when you go into a dark area. Replace any light bulbs as soon as they burn out.  Set up your furniture so you have a clear path. Avoid moving your furniture around.  If any of your floors are uneven, fix them.  If there are any pets around you, be aware of where they are.  Review your medicines with your doctor. Some medicines can make you feel dizzy. This can increase your chance of falling. Ask your doctor what other things that you can do to help prevent falls. This information is not intended to replace advice given to you by your health care provider. Make sure you discuss any questions you have with your health care provider. Document Released: 10/16/2008 Document Revised: 05/28/2015 Document Reviewed: 01/24/2014 Elsevier Interactive Patient Education  2017 [employer].

## 2019-04-30 ENCOUNTER — Other Ambulatory Visit: Payer: Self-pay | Admitting: Family Medicine

## 2019-04-30 DIAGNOSIS — I10 Essential (primary) hypertension: Secondary | ICD-10-CM

## 2019-04-30 DIAGNOSIS — E113553 Type 2 diabetes mellitus with stable proliferative diabetic retinopathy, bilateral: Secondary | ICD-10-CM

## 2019-04-30 NOTE — Telephone Encounter (Signed)
Requested Prescriptions  Pending Prescriptions Disp Refills  . losartan (COZAAR) 25 MG tablet [Pharmacy Med Name: LOSARTAN POTASSIUM 25 MG TAB] 180 tablet 0    Sig: TAKE 2 TABLETS (50 MG) BY MOUTH EVERY DAY-50 MG TABS ON BACKORDER     Cardiovascular:  Angiotensin Receptor Blockers Failed - 04/30/2019  3:09 AM      Failed - Last BP in normal range    BP Readings from Last 1 Encounters:  03/06/19 140/70         Passed - Cr in normal range and within 180 days    Creat  Date Value Ref Range Status  11/01/2018 0.69 0.60 - 0.88 mg/dL Final    Comment:    For patients >60 years of age, the reference limit for Creatinine is approximately 13% higher for people identified as African-American. .    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, without long-term current use of insulin [facility])   [facility] Steele Sizer, MD   7 months ago Scar of skin of lip   [facility] [place], Drue Stager, MD   8 months ago Syncope, unspecified syncope type   [place], FNP   10 months ago Type 2 diabetes mellitus with pressure callus [facility])   [facility] Steele Sizer, MD      Future Appointments            In 2 months Ancil Boozer, Drue Stager, MD [facility], [place]   In 11 months  [facility],  [facility]

## 2019-06-11 ENCOUNTER — Other Ambulatory Visit: Payer: Self-pay | Admitting: Family Medicine

## 2019-06-11 DIAGNOSIS — J3089 Other allergic rhinitis: Secondary | ICD-10-CM

## 2019-06-11 NOTE — Telephone Encounter (Signed)
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, FNP   11 months ago Type 2 diabetes mellitus with pressure callus [facility])   [facility] Steele Sizer, MD      Future Appointments            In 4 weeks Steele Sizer, MD [facility], [place]   In 10 months  [facility], [facility]

## 2019-06-23 ENCOUNTER — Other Ambulatory Visit: Payer: Self-pay | Admitting: Gastroenterology

## 2019-06-23 ENCOUNTER — Other Ambulatory Visit: Payer: Self-pay | Admitting: Family Medicine

## 2019-06-23 DIAGNOSIS — E113553 Type 2 diabetes mellitus with stable proliferative diabetic retinopathy, bilateral: Secondary | ICD-10-CM

## 2019-06-23 DIAGNOSIS — E11628 Type 2 diabetes mellitus with other skin complications: Secondary | ICD-10-CM

## 2019-06-23 DIAGNOSIS — E1169 Type 2 diabetes mellitus with other specified complication: Secondary | ICD-10-CM

## 2019-06-23 NOTE — Telephone Encounter (Signed)
Requested Prescriptions  Pending Prescriptions Disp Refills  . repaglinide (PRANDIN) 2 MG tablet [Pharmacy Med Name: REPAGLINIDE 2 MG TABLET] 270 tablet 0    Sig: TAKE 1 TABLET (2 MG TOTAL) BY MOUTH 3 (THREE) TIMES DAILY.     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, without long-term current use of insulin [facility])   [facility] Steele Sizer, MD   9 months ago Scar of skin of lip   [facility] [place], Drue Stager, MD   10 months ago Syncope, unspecified syncope type   [place], [place]   11 months ago Type 2 diabetes mellitus with pressure callus [facility])   [facility] Steele Sizer, MD      Future Appointments            In 2 weeks Ancil Boozer, Drue Stager, MD [facility], [place]   In 9 months  [facility], [facility]

## 2019-07-09 ENCOUNTER — Ambulatory Visit: Payer: Medicare Other | Admitting: Family Medicine

## 2019-07-09 ENCOUNTER — Other Ambulatory Visit: Payer: Self-pay

## 2019-07-09 ENCOUNTER — Encounter: Payer: Self-pay | Admitting: Family Medicine

## 2019-07-09 VITALS — BP 122/76 | HR 99 | Temp 97.9°F | Resp 16 | Ht 65.0 in | Wt 181.6 lb

## 2019-07-09 DIAGNOSIS — E1169 Type 2 diabetes mellitus with other specified complication: Secondary | ICD-10-CM | POA: Diagnosis not present

## 2019-07-09 DIAGNOSIS — E441 Mild protein-calorie malnutrition: Secondary | ICD-10-CM

## 2019-07-09 DIAGNOSIS — S065XAA Traumatic subdural hemorrhage with loss of consciousness status unknown, initial encounter: Secondary | ICD-10-CM

## 2019-07-09 DIAGNOSIS — E113553 Type 2 diabetes mellitus with stable proliferative diabetic retinopathy, bilateral: Secondary | ICD-10-CM | POA: Diagnosis not present

## 2019-07-09 DIAGNOSIS — E538 Deficiency of other specified B group vitamins: Secondary | ICD-10-CM

## 2019-07-09 DIAGNOSIS — E785 Hyperlipidemia, unspecified: Secondary | ICD-10-CM | POA: Diagnosis not present

## 2019-07-09 DIAGNOSIS — S065X9A Traumatic subdural hemorrhage with loss of consciousness of unspecified duration, initial encounter: Secondary | ICD-10-CM

## 2019-07-09 DIAGNOSIS — I10 Essential (primary) hypertension: Secondary | ICD-10-CM | POA: Diagnosis not present

## 2019-07-09 DIAGNOSIS — E11628 Type 2 diabetes mellitus with other skin complications: Secondary | ICD-10-CM

## 2019-07-09 DIAGNOSIS — L84 Corns and callosities: Secondary | ICD-10-CM

## 2019-07-09 LAB — POCT GLYCOSYLATED HEMOGLOBIN (HGB A1C): Hemoglobin A1C: 6.5 % — AB (ref 4.0–5.6)

## 2019-07-09 MED ORDER — ATORVASTATIN CALCIUM 40 MG PO TABS: 40.0000 mg | ORAL_TABLET | Freq: Every day | ORAL | 1 refills | Status: DC

## 2019-07-09 MED ORDER — LOSARTAN POTASSIUM 25 MG PO TABS: 25.0000 mg | ORAL_TABLET | Freq: Every day | ORAL | 1 refills | Status: DC

## 2019-07-09 MED ORDER — REPAGLINIDE 2 MG PO TABS: 2.0000 mg | ORAL_TABLET | Freq: Two times a day (BID) | ORAL | 1 refills | Status: DC

## 2019-07-09 NOTE — Progress Notes (Signed)
Name: Beverly [name]   MRN: [id number]    DOB: [birth year]   Date:07/09/2019       Progress Note  Subjective  Chief Complaint  Chief Complaint  Patient presents with  . Follow-up  . Diabetes    HPI  Iron deficiency anemia: she was seen by Dr. Marius Ditch and had EGD and colonoscopy done 09/212020, only one polyp, last CBC and iron storage was back to normal, she is taking iron a few times a week only, no pica, no sob. She states her appetite is better  DMII: she has been taking Metformin to 2000 mg daily,takingPrandin three times a day, A1C still 6.5%, advised to only take Prandin before two larger meals of the day. Fastinglow 100's andpost- prandially150-160Eye exam is up to date, she sees podiatrist for her callus every 9 weeks. She denies polyphagia or polydipsia but she has nocturia - twice per night - stable. No side effects of medication, no recent hypoglycemic episodes.   Syncopal episode: seen by cardiologist, Dr. Caryl Comes, echo showed normal EF. Possibly from change in bp medication because of shortage. She has not had any more episodes Had sub-dural hematoma, under the care of Dr. Lacinda Axon and will have a repeat imaging   HTN:BP isat goal today,no chest pain, no palpitationor dizziness. Taking medication as prescribed, no side effects. Takes Furosemide prn only for severe swelling, discussed compression stocking hoses to control swelling.BP today is at goal   Hyperlipidemia: taking Atorvastatin, no myalgias.Recheck labs next visit   AR: well controlled,taking medication prn and is doing well at this time, no rhinorrhea or nasal congestion at this time.Unchanged   OA: she has right knee pain, it started after a fall 35plus years agosecondary to a fall, she takes arthritis pain pill otc Pain is described as dull and aching, intermittent but is doing well on tylenol.Unchanged   Unintentional weight loss: she hadlost 18 lbsfrom 2018 to 2019.She statesshe  wasunder a lot of stress since Summer of 2018 worried about grand-daughteralso her daughter was diagnosed with uterine cancer. Since she lost weight, she got motivated to eat healthier, she feel Summer 2020 and broker her dentures during a syncopal episode. Explained that we can look for occult cancer but she is still not interested Unchanged   October 2017 weight was 228 lbs October 2018 weight was 217 lbs October 2019 weight was 207 lbs October 2020 weight is 189 lbs March 2021 weight is 183 lbs July 2021 weight is 181.6 lbs   Syncope: seen by cardiologist and they suggested to get heart monitor but she states she did not want it.  Right kidney stone: seen by nephrologist, had a renal US that showed 6 mm non obstructing stone, no pain at this time or hematuria. Unchanged     Patient Active Problem List   Diagnosis Date Noted  . Iron deficiency anemia   . Subdural hematoma ([place])   . Syncope and collapse 07/24/2018  . 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 Type 2 diabetes mellitus with pressure callus ([place]) 10/24/2017  . Diabetic retinopathy ([place]) 11/12/2015  . Obesity (BMI 30.0-34.9) 06/02/2015  . Osteoarthritis 06/02/2015  . Osteopenia 06/02/2015  . Narrowing of intervertebral disc space 06/02/2015  . Dyslipidemia associated with type 2 diabetes mellitus ([place]) 06/02/2015  . Perennial allergic rhinitis 02/28/2007  . Benign essential HTN 06/26/2006  . Pure hypercholesterolemia 06/26/2006    Past Surgical History:  Procedure  Laterality Date  . ABDOMINAL HYSTERECTOMY    . BUNIONECTOMY    . COLONOSCOPY    . COLONOSCOPY WITH PROPOFOL N/A 09/24/2018   Procedure: COLONOSCOPY WITH PROPOFOL;  Surgeon: Lin Landsman, MD;  Location[facility] ENDOSCOPY;  Service: Gastroenterology;  Laterality: N/A;  . ESOPHAGOGASTRODUODENOSCOPY (EGD) WITH PROPOFOL N/A 09/24/2018   Procedure:  ESOPHAGOGASTRODUODENOSCOPY (EGD) WITH PROPOFOL;  Surgeon: Lin Landsman, MD;  Location: [facility] ENDOSCOPY;  Service: Gastroenterology;  Laterality: N/A;  . GIVENS CAPSULE STUDY N/A 10/11/2018   Procedure: GIVENS CAPSULE STUDY;  Surgeon: Lin Landsman, MD;  Location: [facility] ENDOSCOPY;  Service: Gastroenterology;  Laterality: N/A;  . HERNIA REPAIR      Family History  Problem Relation Age of Onset  . Hypertension Mother   . Healthy Father   . Healthy Daughter   . Raynaud syndrome Daughter   . Hypertension Daughter   . Stroke Sister     Social History   Tobacco Use  . Smoking status: Never Smoker  . Smokeless tobacco: Never Used  . Tobacco comment: smoking cessation materials not required  Substance Use Topics  . Alcohol use: No    Alcohol/week: 0.0 standard drinks     Current Outpatient Medications:  .  alendronate (FOSAMAX) 35 MG tablet, TAKE 1 TABLET BY MOUTH WEEKLY WITH A FULL GLASS OF WATER ON AN EMPTY STOMACH., Disp: 12 tablet, Rfl: 1 .  atorvastatin (LIPITOR) 40 MG tablet, TAKE 1 TABLET BY MOUTH EVERY DAY, Disp: 90 tablet, Rfl: 1 .  Cholecalciferol (VITAMIN D) 2000 units CAPS, Take 1 capsule (2,000 Units total) by mouth daily., Disp: 30 capsule, Rfl: 0 .  ferrous sulfate 325 (65 FE) MG tablet, Take 325 mg by mouth 2 (two) times daily with a meal. , Disp: , Rfl:  .  furosemide (LASIX) 40 MG tablet, Take 1 tablet (40 mg total) by mouth daily as needed., Disp: 90 tablet, Rfl: 0 .  Glucosamine Sulfate 500 MG TABS, Take 1 tablet by mouth once a week. , Disp: , Rfl:  .  loratadine (CLARITIN) 10 MG tablet, Take 1 tablet (10 mg total) by mouth daily., Disp: 90 tablet, Rfl: 1 .  losartan (COZAAR) 25 MG tablet, TAKE 2 TABLETS (50 MG) BY MOUTH EVERY DAY-50 MG TABS ON BACKORDER, Disp: 180 tablet, Rfl: 0 .  metFORMIN (GLUCOPHAGE) 1000 MG tablet, Take 1 tablet (1,000 mg total) by mouth 2 (two) times daily with a meal., Disp: 180 tablet, Rfl: 1 .  montelukast (SINGULAIR) 10 MG  tablet, TAKE 1 TABLET BY MOUTH EVERYDAY AT BEDTIME, Disp: 90 tablet, Rfl: 2 .  Multiple Vitamin (MULTI-VITAMINS) TABS, Take by mouth., Disp: , Rfl:  .  omeprazole (PRILOSEC) 20 MG capsule, TAKE 1 CAPSULE (20 MG TOTAL) BY MOUTH 2 (TWO) TIMES DAILY BEFORE A MEAL., Disp: 180 capsule, Rfl: 0 .  Potassium Chloride ER 20 MEQ TBCR, Take 10 mEq by mouth daily as needed. With lasix, Disp: , Rfl:  .  repaglinide (PRANDIN) 2 MG tablet, TAKE 1 TABLET (2 MG TOTAL) BY MOUTH 3 (THREE) TIMES DAILY., Disp: 270 tablet, Rfl: 0 .  vitamin B-12 (CYANOCOBALAMIN) 1000 MCG tablet, Take 1,000 mcg by mouth 3 (three) times a week., Disp: , Rfl:  .  vitamin C (ASCORBIC ACID) 500 MG tablet, Take 500 mg by mouth daily., Disp: , Rfl:  .  vitamin e 100 UNT/0.25ML OIL oil, Take by mouth daily., Disp: , Rfl:  .  Zinc Acetate 50 MG CAPS, Take by mouth daily. , Disp: , Rfl:  Allergies  Allergen Reactions  . Lisinopril     I personally reviewed active problem list, medication list, allergies, family history, social history, health maintenance with the patient/caregiver today.   ROS  Constitutional: Negative for fever or recent weight change.  Respiratory: Negative for cough and shortness of breath.   Cardiovascular: Negative for chest pain or palpitations.  Gastrointestinal: Negative for abdominal pain, no bowel changes.  Musculoskeletal: Negative for gait problem or joint swelling.  Skin: Negative for rash.  Neurological: Negative for dizziness or headache.  No other specific complaints in a complete review of systems (except as listed in HPI above).  Objective  Vitals:   07/09/19 1135  BP: 122/76  Pulse: 99  Resp: 16  Temp: 97.9 F (36.6 C)  TempSrc: Temporal  SpO2: 100%  Weight: 181 lb 9.6 oz (82.4 kg)  Height: 5\' 5"  (1.651 m)    Body mass index is 30.22 kg/m.  Physical Exam  Constitutional: Patient appears well-developed , temporal waisting  No distress.  HEENT: head atraumatic, normocephalic,  pupils equal and reactive to light, neck supple Cardiovascular: Normal rate, regular rhythm and normal heart sounds.  No murmur heard. No BLE edema. Pulmonary/Chest: Effort normal and breath sounds normal. No respiratory distress. Abdominal: Soft.  There is no tenderness. Neurological : no nystagmus, normal grip, normal gait Psychiatric: Patient has a normal mood and affect. behavior is normal. Judgment and thought content normal.  Recent Results (from the past 2160 hour(s))  POCT HgB A1C     Status: Abnormal   Collection Time: 07/09/19 11:43 AM  Result Value Ref Range   Hemoglobin A1C 6.5 (A) 4.0 - 5.6 %   HbA1c POC (<> result, manual entry)     HbA1c, POC (prediabetic range)     HbA1c, POC (controlled diabetic range)        PHQ2/9: Depression screen [facility] 2/9 07/09/2019 04/16/2019 03/06/2019 11/01/2018 09/05/2018  Decreased Interest 0 0 1 0 0  Down, Depressed, Hopeless 0 0 0 0 0  PHQ - 2 Score 0 0 1 0 0  Altered sleeping 0 - 0 0 0  Tired, decreased energy 0 - 0 0 0  Change in appetite 0 - 0 0 0  Feeling bad or failure about yourself  0 - 0 0 0  Trouble concentrating 0 - - 0 0  Moving slowly or fidgety/restless 0 - 0 0 0  Suicidal thoughts 0 - 0 0 0  PHQ-9 Score 0 - 1 0 0  Difficult doing work/chores - - - Not difficult at all -  Some recent data might be hidden    phq 9 is negative   Fall Risk: Fall Risk  07/09/2019 04/16/2019 03/06/2019 03/06/2019 11/01/2018  Falls in the past year? 0 1 1 0 1  Number falls in past yr: 1 0 0 0 0  Injury with Fall? 0 1 1 0 1  Comment - - hurt her knees, dentures - -  Risk for fall due to : - History of fall(s) - - -  Risk for fall due to: Comment - - - - -  Follow up - Falls prevention discussed - - -      Functional Status Survey: Is the patient deaf or have difficulty hearing?: No Does the patient have difficulty seeing, even when wearing glasses/contacts?: No Does the patient have difficulty concentrating, remembering, or making decisions?:  No Does the patient have difficulty walking or climbing stairs?: No Does the patient have difficulty dressing or bathing?: No  Does the patient have difficulty doing errands alone such as visiting a doctor's office or shopping?: No    Assessment & Plan   1. 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  Dispense: 180 tablet; Refill: 1  2. Essential (primary) hypertension  - losartan (COZAAR) 25 MG tablet; Take 1 tablet (25 mg total) by mouth daily.  Dispense: 180 tablet; Refill: 1  3. Dyslipidemia associated with type 2 diabetes mellitus (HCC)  - atorvastatin (LIPITOR) 40 MG tablet; Take 1 tablet (40 mg total) by mouth daily.  Dispense: 90 tablet; Refill: 1 - repaglinide (PRANDIN) 2 MG tablet; Take 1 tablet (2 mg total) by mouth 2 (two) times daily before a meal.  Dispense: 180 tablet; Refill: 1  4. Dyslipidemia  - atorvastatin (LIPITOR) 40 MG tablet; Take 1 tablet (40 mg total) by mouth daily.  Dispense: 90 tablet; Refill: 1  5. Type 2 diabetes mellitus with pressure callus (HCC)  - repaglinide (PRANDIN) 2 MG tablet; Take 1 tablet (2 mg total) by mouth 2 (two) times daily before a meal.  Dispense: 180 tablet; Refill: 1  6. B12 deficiency    7. Subdural hematoma (HCC)  Under the care of Dr. Lacinda Axon   8. Mild protein-calorie malnutrition ([place])  Weight is now stable, she does not want evaluation for weight loss

## 2019-07-16 ENCOUNTER — Other Ambulatory Visit: Payer: Self-pay | Admitting: Neurosurgery

## 2019-07-16 DIAGNOSIS — D329 Benign neoplasm of meninges, unspecified: Secondary | ICD-10-CM

## 2019-07-31 ENCOUNTER — Other Ambulatory Visit: Payer: Self-pay | Admitting: Family Medicine

## 2019-07-31 DIAGNOSIS — I10 Essential (primary) hypertension: Secondary | ICD-10-CM

## 2019-07-31 NOTE — Telephone Encounter (Signed)
Requested medications are due for refill today?    Unclear about patient's dosage.  Pharmacy is requesting refills on Losartan 25 mg - with directions for patient to take 2 tabs (total dose of 50 mg) daily.  Patient had previously been on this dose until the office visit on 07/09/2019 when the dose was changed to 1 tab daily. Provider's note indicated patient's BP was at goal.   One the new RX written on 07/09/2019 - While the directions were for one 25 mg tablet, the number dispensed was 180 with one refill which is consistent with the patient taking two of the 25 mg tablets per day.    Requested medications are on active medication list?  Not at the same dose.    Last Refill:   07/09/2019  # 180 and one refill - THIS IS FOR ONE 25 mg TABLET DAILY.    Future visit scheduled?  Yes in 3 months.   Notes to Clinic:  Please review to clarify the right dose for the patient.

## 2019-08-20 ENCOUNTER — Other Ambulatory Visit: Payer: Self-pay

## 2019-08-20 ENCOUNTER — Ambulatory Visit
Admission: RE | Admit: 2019-08-20 | Discharge: 2019-08-20 | Disposition: A | Discharge status: Home / Self Care | Payer: Medicare Other | Source: Ambulatory Visit | Attending: Neurosurgery | Admitting: Neurosurgery

## 2019-08-20 DIAGNOSIS — D329 Benign neoplasm of meninges, unspecified: Secondary | ICD-10-CM | POA: Insufficient documentation

## 2019-08-20 IMAGING — MR MR HEAD WO/W CM
14 series · 44 of 48 positions shown · IV contrast (gadavist)
Comparison: Prior brain MRI [DATE]

CLINICAL DATA: Meningioma.

EXAM:
MRI HEAD WITHOUT AND WITH CONTRAST
TECHNIQUE: Multiplanar, multiecho pulse sequences of the brain and surrounding
structures were obtained without and with intravenous contrast.
CONTRAST:  7.5mL GADAVIST GADOBUTROL 1 MMOL/ML IV SOLN

[Series 5: ax dwi_tracew · axial · 3.0mm · 0.60mm/px · z∈[-128,+26]mm · 4 of 48 slices shown]
[im 1/48]
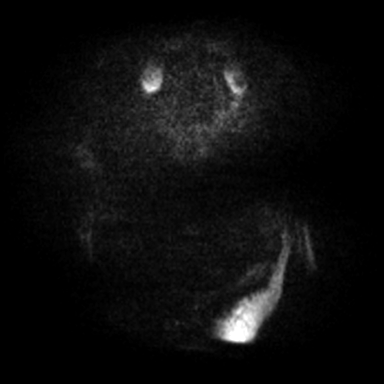
[im 16/48]
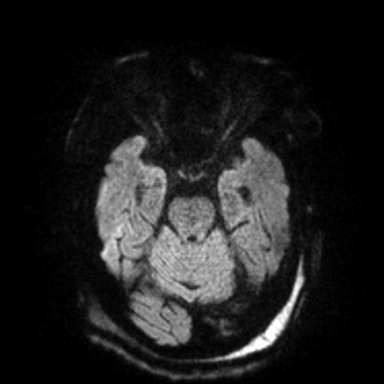
[im 32/48]
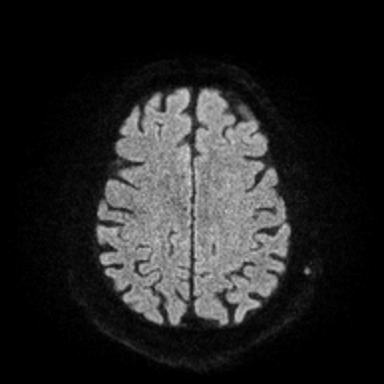
[im 48/48]
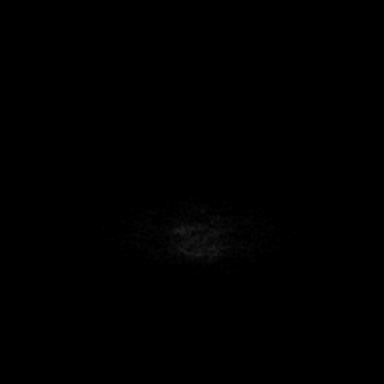

[Series 6: ax dwi_adc · axial · 3.0mm · 0.60mm/px · z∈[-128,+26]mm · 4 of 48 slices shown]
[im 1/48]
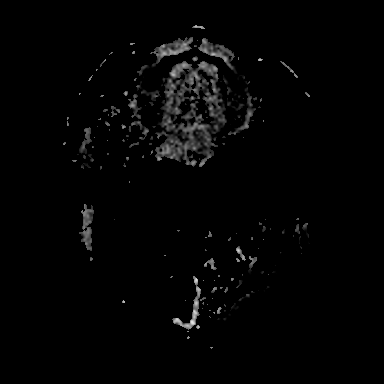
[im 16/48]
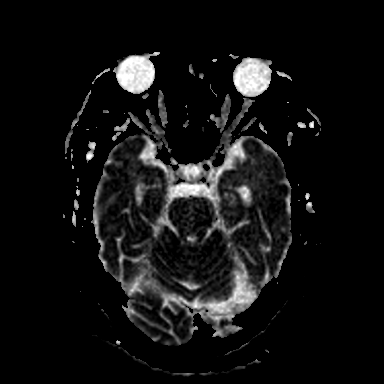
[im 32/48]
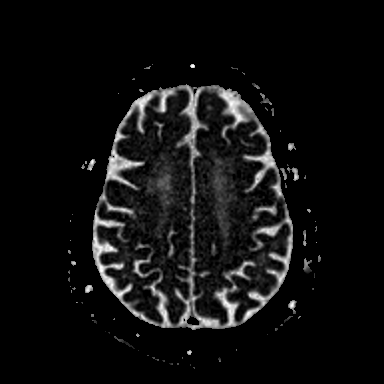
[im 48/48]
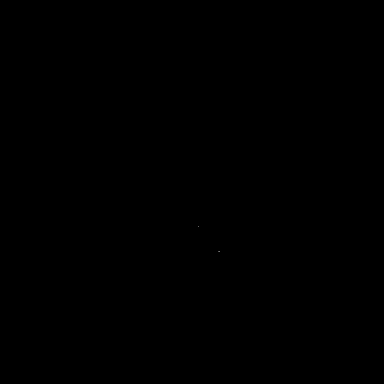

[Series 7: cor dwi_tracew · coronal · 5.0mm · 0.60mm/px · 2 of 38 slices shown]
[im 1/38]
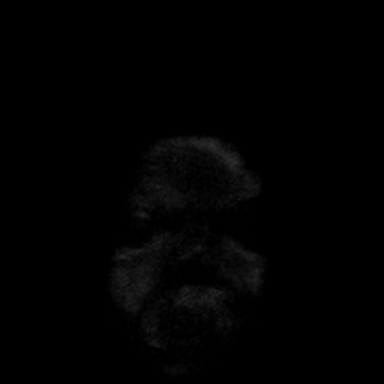
[im 38/38]
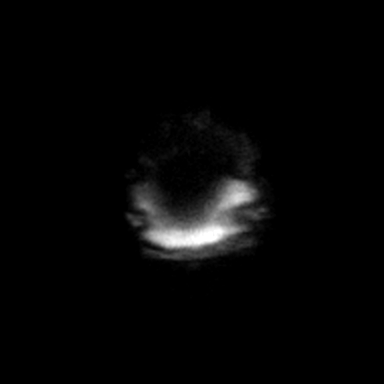

[Series 8: cor dwi_adc · coronal · 5.0mm · 0.60mm/px · 2 of 38 slices shown]
[im 1/38]
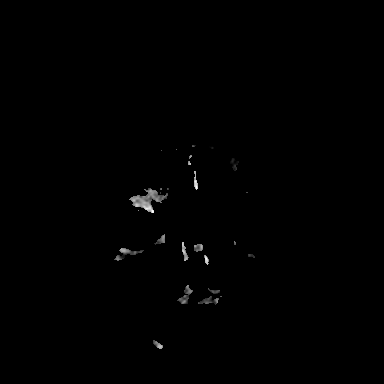
[im 38/38]
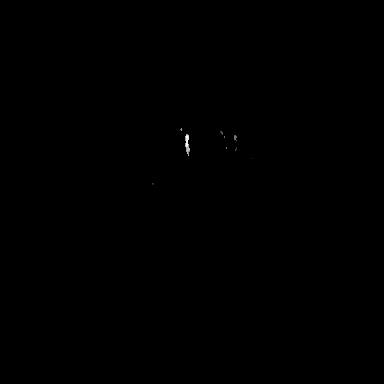

[Series 9: T1 · sagittal · 5.0mm · 0.62mm/px · 1 of 23 slices shown (1 of 2)]
[im 1/23]
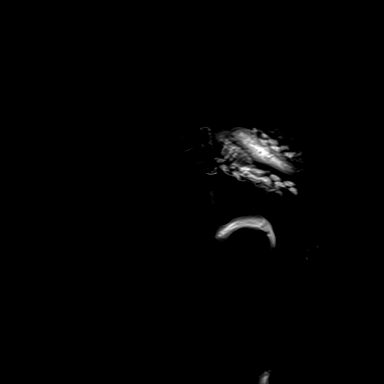

[Series 10: T2 · axial · 5.0mm · 0.53mm/px · 1 of 25 slices shown]
[im 1/25]
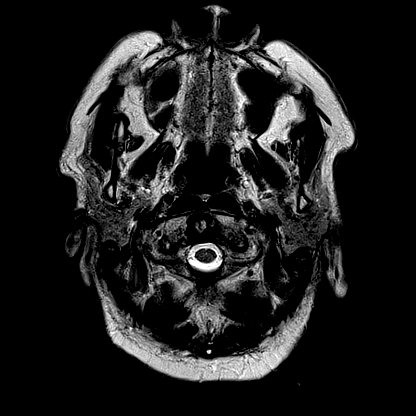

[Series 12: pha_images · axial · 3.0mm · 0.90mm/px · z∈[-136,+34]mm · 3 of 57 slices shown]
[im 1/57]
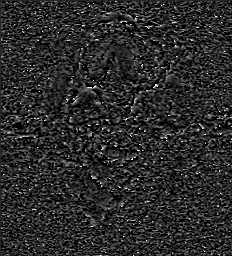
[im 29/57]
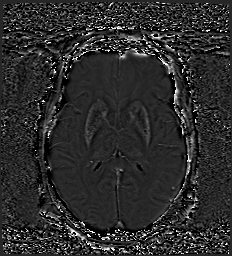
[im 57/57]
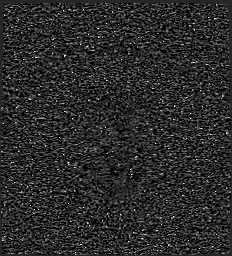

[Series 13: swi_images · axial · 3.0mm · 0.90mm/px · z∈[-139,+37]mm · 3 of 60 slices shown]
[im 1/60]
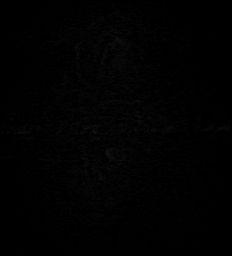
[im 30/60]
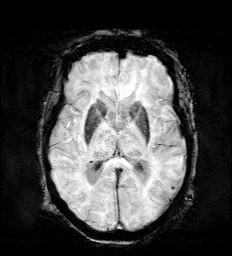
[im 60/60]
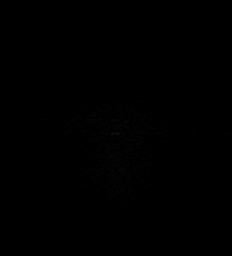

[Series 15: FLAIR · axial · 3.0mm · 0.53mm/px · z∈[-131,+30]mm · 3 of 55 slices shown]
[im 1/55]
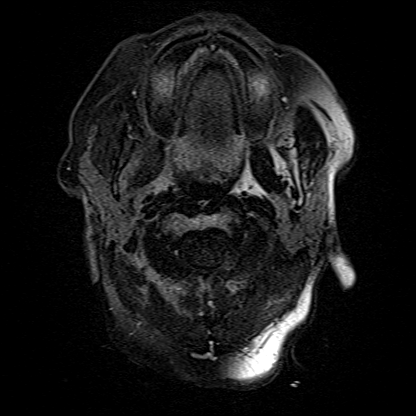
[im 28/55]
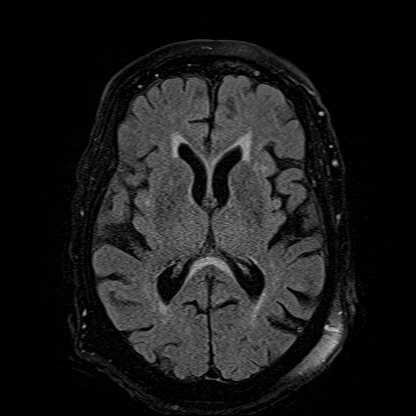
[im 55/55]
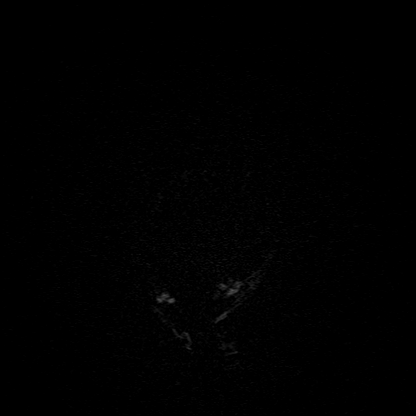

[Series 16: T1 · axial · 1.0mm · 0.98mm/px · z∈[-139,+36]mm · 8 of 176 slices shown (2 of 2)]
[im 1/176]
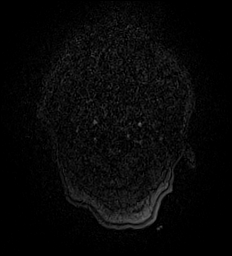
[im 20/176]
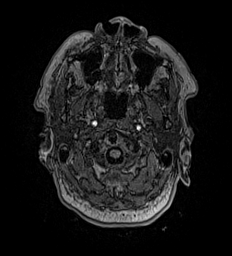
[im 59/176]
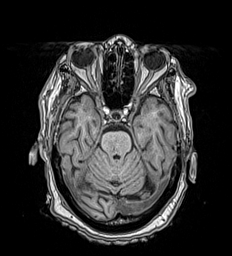
[im 78/176]
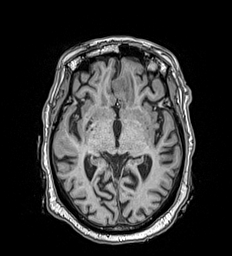
[im 98/176]
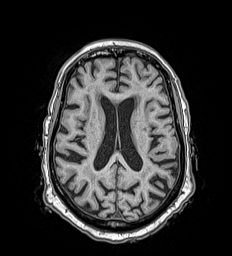
[im 117/176]
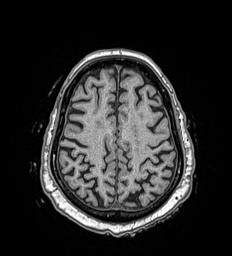
[im 156/176]
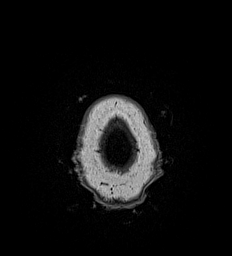
[im 176/176]
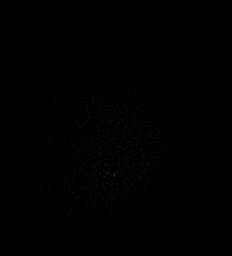

[Series 17: T2 post-contrast · coronal · 5.0mm · 0.57mm/px · 2 of 29 slices shown]
[im 1/29]
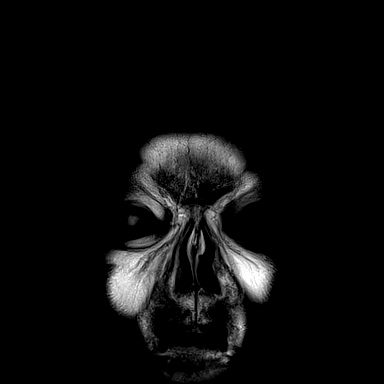
[im 29/29]
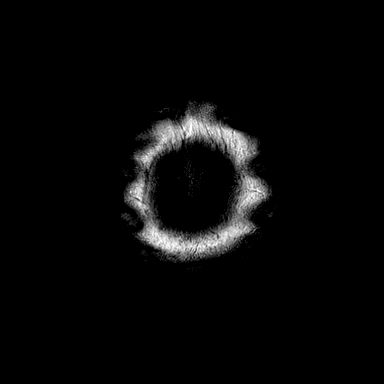

[Series 18: T1 post-contrast · axial · 1.0mm · 0.98mm/px · z∈[-139,+36]mm · 8 of 176 slices shown (1 of 3)]
[im 1/176]
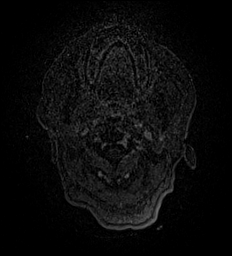
[im 20/176]
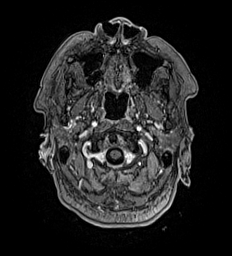
[im 59/176]
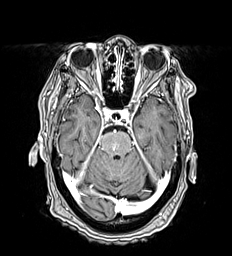
[im 78/176]
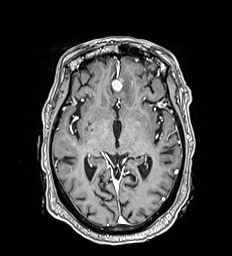
[im 98/176]
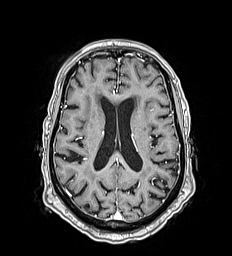
[im 117/176]
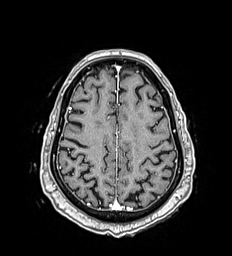
[im 156/176]
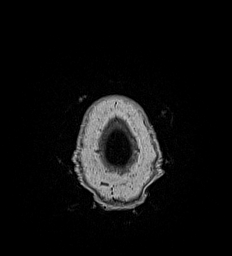
[im 176/176]
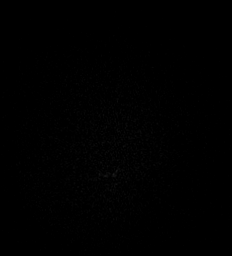

[Series 19: T1 post-contrast · coronal · 5.0mm · 0.57mm/px · 2 of 29 slices shown (2 of 3)]
[im 1/29]
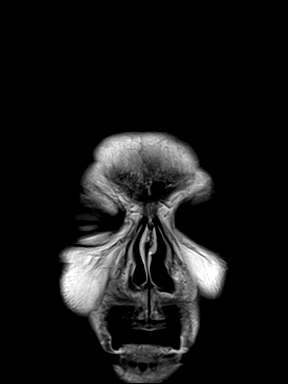
[im 29/29]
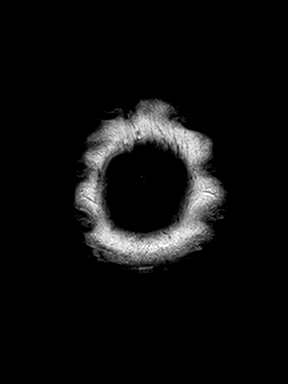

[Series 20: T1 post-contrast · sagittal · 5.0mm · 0.62mm/px · 1 of 23 slices shown (3 of 3)]
[im 1/23]
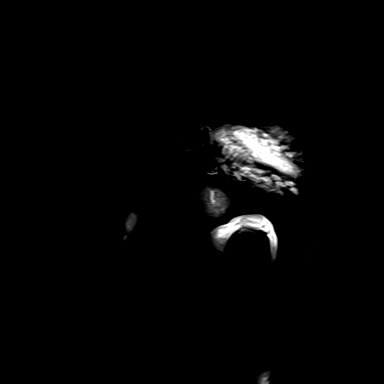

[44 of 48 positions shown; findings below may reference images not displayed]

FINDINGS: Brain:

Stable, mild generalized parenchymal atrophy.

Again demonstrated is a modestly enhancing dural-based mass along
the planum sphenoidale consistent with meningioma. This mass
measures 2.8 x 2.6 x 1.8 cm (AP x TV x CC) on the current
examination. This is similar to minimally increased in size as
compared to the examination of [DATE] at which time measuring
2.8 x 2.6 x 1.7 cm (remeasured on prior). As before, there is mass
effect upon the anteroinferior frontal lobes with mild adjacent
parenchymal edema.

Stable moderate scattered T2/FLAIR hyperintensity within the
cerebral white matter and pons which is nonspecific, but consistent
with chronic small vessel ischemic disease.

Also unchanged from the prior MRI, there is ill-defined enhancement
within the central pons with corresponding SWI signal loss. This
likely reflects a vascular malformation such as capillary
telangiectasia.

A previously demonstrated thin para falcine subdural hematoma is no
longer present.

There is no acute infarct.

No extra-axial fluid collection.

No midline shift.

Vascular: Expected proximal arterial flow voids.

Skull and upper cervical spine: No focal marrow lesion.

Sinuses/Orbits: Visualized orbits show no acute finding. Mild
ethmoid sinus mucosal thickening. No significant mastoid effusion.
IMPRESSION: 2.8 x 2.6 x 1.8 cm planum sphenoidale meningioma, similar to
minimally increased in size as compared to the MRI of [DATE]. As
before, there is localized mass effect upon the anteroinferior
frontal lobes with mild adjacent parenchymal edema.

Stable generalized parenchymal atrophy and chronic small vessel
ischemic disease.

A previously demonstrated thin parafalcine subdural hematoma is no
longer present.

Redemonstrated ill-defined enhancement and SWI signal loss within
the pons likely reflecting capillary telangiectasia, possibly with
superimposed developmental venous anomaly.

## 2019-08-20 MED ORDER — GADOBUTROL 1 MMOL/ML IV SOLN
7.5000 mL | Freq: Once | INTRAVENOUS | Status: AC | PRN
  Administered 2019-08-20: 7.5 mL via INTRAVENOUS

## 2019-09-05 NOTE — Progress Notes (Signed)
Patient ID: Beverly Beverly [name], female    DOB: March 22, 1934, 86 y.o.   MRN: [id number]  PCP: Steele Sizer, MD  Chief Complaint  Patient presents with  . Pain    left side pain started 6-7 days ago, describes as sharp  . Eye Pain    Right eye started hurting about 5 days ago also a sharp pain, not contstant    Subjective:   Beverly Beverly [name] is a 85 y.o. female, presents to clinic with CC of the following:  Chief Complaint  Patient presents with  . Pain    left side pain started 6-7 days ago, describes as sharp  . Eye Pain    Right eye started hurting about 5 days ago also a sharp pain, not contstant    HPI:  Patient is Beverly [name] 85 year old female patient of Dr. Ancil Boozer Last visit with her was 07/09/2019 Follows up today with complaints of left side pain and some pain above the right eye  Patient was seen 08/27/2019 by neurosurgery for follow-up of a known anterior cranial fossa meningioma There repeat MRI and assessment/plan was as follows:  MRI brain with and without contrast: Again demonstrated is a modestly enhancing dural-based mass along the planum sphenoidale consistent with meningioma. This mass measures 2.8 x 2.6 x 1.8 cm (AP x TV x CC) on the current examination. This is similar to minimally increased in size as compared to the examination of 07/24/2018 at which time measuring 2.8 x 2.6 x 1.7 cm (remeasured on prior). As before, there is mass effect upon the anteroinferior frontal lobes with mild adjacent parenchymal edema.   Impression/Plan:  Ms. Kite is here for evaluation of a known meningioma. Given that she has no symptoms and MRI shows no significant growth, I did talk to her about her treatment options remaining with surveillance versus radiation versus resection. She would like to proceed with continued surveillance and I did recommend stretching out the interval. I would like to see her back in approximate 18 months with a repeat MRI. She knows to contact me sooner  should she develop any new symptoms.  Follow up: MRI brain with without contrast in 18 months  She noted the left side pain started about 6 to 7 days ago.  She denies any trauma before this started, notes it is intermittent, not just occurring with movements, sometimes at rest, and feels it above the bony area on her side and sometimes can feel it towards the abdomen.  She denies any dysuria, no blood in the urine, no increased frequency, and notes she urinates about every 2 hours routinely.  No flank pain.  Also denies any change in bowel habits with no black or dark stools, no constipation or diarrhea, no bleeding.  Denies any fevers.  Has not had a pain like this before noted.  She has been taking Tylenol arthritis strength, and applying ice to the area intermittently.  She also about 5 days ago, started to feel some discomfort above her right eye, and feels it sometimes radiating down towards her right eye.  She denies any vision changes, no redness or discharge from the eye, and called her eye doctor, who recommended she be seen by her PCP as it did not sound like [name] eye issue.  She has had no rash, no swelling in this area.  Denied any trauma she can recall.  Denies any facial droop, no one-sided symptoms of concern.  She states she usually does not  get headaches so this is unusual for her.  She also has been applying some cold to this area.  Denies any neck stiffness.  Patient Active Problem List   Diagnosis Date Noted  . Iron deficiency anemia   . Subdural hematoma ([place])   . Syncope and collapse 07/24/2018  . 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 Type 2 diabetes mellitus with pressure callus ([place]) 10/24/2017  . Diabetic retinopathy ([place]) 11/12/2015  . Obesity (BMI 30.0-34.9) 06/02/2015  . Osteoarthritis 06/02/2015  . Osteopenia 06/02/2015  . Narrowing of intervertebral disc space 06/02/2015    . Dyslipidemia associated with type 2 diabetes mellitus ([place]) 06/02/2015  . Perennial allergic rhinitis 02/28/2007  . Benign essential HTN 06/26/2006  . Pure hypercholesterolemia 06/26/2006      Current Outpatient Medications:  .  alendronate (FOSAMAX) 35 MG tablet, TAKE 1 TABLET BY MOUTH WEEKLY WITH A FULL GLASS OF WATER ON [name] EMPTY STOMACH., Disp: 12 tablet, Rfl: 1 .  atorvastatin (LIPITOR) 40 MG tablet, Take 1 tablet (40 mg total) by mouth daily., Disp: 90 tablet, Rfl: 1 .  Cholecalciferol (VITAMIN D) 2000 units CAPS, Take 1 capsule (2,000 Units total) by mouth daily., Disp: 30 capsule, Rfl: 0 .  ferrous sulfate 325 (65 FE) MG tablet, Take 325 mg by mouth 2 (two) times daily with a meal. , Disp: , Rfl:  .  furosemide (LASIX) 40 MG tablet, Take 1 tablet (40 mg total) by mouth daily as needed., Disp: 90 tablet, Rfl: 0 .  Glucosamine Sulfate 500 MG TABS, Take 1 tablet by mouth once a week. , Disp: , Rfl:  .  loratadine (CLARITIN) 10 MG tablet, Take 1 tablet (10 mg total) by mouth daily., Disp: 90 tablet, Rfl: 1 .  losartan (COZAAR) 50 MG tablet, Take 1 tablet (50 mg total) by mouth daily., Disp: 90 tablet, Rfl: 1 .  metFORMIN (GLUCOPHAGE) 1000 MG tablet, Take 1 tablet (1,000 mg total) by mouth 2 (two) times daily with a meal., Disp: 180 tablet, Rfl: 1 .  montelukast (SINGULAIR) 10 MG tablet, TAKE 1 TABLET BY MOUTH EVERYDAY AT BEDTIME, Disp: 90 tablet, Rfl: 2 .  Multiple Vitamin (MULTI-VITAMINS) TABS, Take by mouth., Disp: , Rfl:  .  omeprazole (PRILOSEC) 20 MG capsule, TAKE 1 CAPSULE (20 MG TOTAL) BY MOUTH 2 (TWO) TIMES DAILY BEFORE A MEAL., Disp: 180 capsule, Rfl: 0 .  Potassium Chloride ER 20 MEQ TBCR, Take 10 mEq by mouth daily as needed. With lasix, Disp: , Rfl:  .  repaglinide (PRANDIN) 2 MG tablet, Take 1 tablet (2 mg total) by mouth 2 (two) times daily before a meal., Disp: 180 tablet, Rfl: 1 .  vitamin B-12 (CYANOCOBALAMIN) 1000 MCG tablet, Take 1,000 mcg by mouth 3 (three) times a  week., Disp: , Rfl:  .  vitamin C (ASCORBIC ACID) 500 MG tablet, Take 500 mg by mouth daily., Disp: , Rfl:  .  vitamin e 100 UNT/0.25ML OIL oil, Take by mouth daily., Disp: , Rfl:  .  Zinc Acetate 50 MG CAPS, Take by mouth daily. , Disp: , Rfl:    Allergies  Allergen Reactions  . Lisinopril      Past Surgical History:  Procedure Laterality Date  . ABDOMINAL HYSTERECTOMY    . BUNIONECTOMY    . COLONOSCOPY    . COLONOSCOPY WITH PROPOFOL N/A 09/24/2018   Procedure: COLONOSCOPY WITH PROPOFOL;  Surgeon: Lin Landsman, MD;  Location: [facility] ENDOSCOPY;  Service: Gastroenterology;  Laterality: N/A;  . ESOPHAGOGASTRODUODENOSCOPY (EGD) WITH PROPOFOL N/A 09/24/2018   Procedure: ESOPHAGOGASTRODUODENOSCOPY (EGD) WITH PROPOFOL;  Surgeon: Lin Landsman, MD;  Location: [facility] ENDOSCOPY;  Service: Gastroenterology;  Laterality: N/A;  . GIVENS CAPSULE STUDY N/A 10/11/2018   Procedure: GIVENS CAPSULE STUDY;  Surgeon: Lin Landsman, MD;  Location: [facility] ENDOSCOPY;  Service: Gastroenterology;  Laterality: N/A;  . HERNIA REPAIR       Family History  Problem Relation Age of Onset  . Hypertension Mother   . Healthy Father   . Healthy Daughter   . Raynaud syndrome Daughter   . Hypertension Daughter   . Stroke Sister      Social History   Tobacco Use  . Smoking status: Never Smoker  . Smokeless tobacco: Never Used  . Tobacco comment: smoking cessation materials not required  Substance Use Topics  . Alcohol use: No    Alcohol/week: 0.0 standard drinks    With staff assistance, above reviewed with the patient today.  ROS: As per HPI, otherwise no specific complaints on a limited and focused system review   No results found for this or any previous visit (from the past 72 hour(s)).   PHQ2/9: Depression screen [facility] 2/9 09/06/2019 07/09/2019 04/16/2019 03/06/2019 11/01/2018  Decreased Interest 0 0 0 1 0  Down, Depressed, Hopeless 0 0 0 0 0  PHQ - 2 Score 0 0 0 1 0  Altered sleeping -  0 - 0 0  Tired, decreased energy - 0 - 0 0  Change in appetite - 0 - 0 0  Feeling bad or failure about yourself  - 0 - 0 0  Trouble concentrating - 0 - - 0  Moving slowly or fidgety/restless - 0 - 0 0  Suicidal thoughts - 0 - 0 0  PHQ-9 Score - 0 - 1 0  Difficult doing work/chores - - - - Not difficult at all  Some recent data might be hidden   PHQ-2/9 Result is neg  Fall Risk: Fall Risk  09/06/2019 07/09/2019 04/16/2019 03/06/2019 03/06/2019  Falls in the past year? 0 0 1 1 0  Number falls in past yr: 0 1 0 0 0  Injury with Fall? 0 0 1 1 0  Comment - - - hurt her knees, dentures -  Risk for fall due to : - - History of fall(s) - -  Risk for fall due to: Comment - - - - -  Follow up Falls evaluation completed - Falls prevention discussed - -      Objective:   Vitals:   09/06/19 1302  BP: 128/68  Pulse: 93  Resp: 16  Temp: 97.9 F (36.6 C)  TempSrc: Oral  SpO2: 99%  Weight: 177 lb 8 oz (80.5 kg)  Height: 5\' 5"  (1.651 m)    Body mass index is 29.54 kg/m.  Physical Exam   NAD, masked, pleasant, nontoxic-appearing HEENT - [place]/AT, sclera anicteric, PERRL, EOMI with no pain testing extraocular motion, no photophobia, conj - non-inj'ed, no maxillary sinus tenderness to palpation, she was tender to palpation in a focal area above the eyebrow with no bruising, no focal swelling, no erythema or rash present, she had multiple pigmented lesions noted on the face and neck, although this was not new for her, no crepitus to palpation of the periorbital region, nares patent, pharynx clear Neck - supple, no adenopathy, no rigidity, carotids 2+ and = without bruits bilat Car - RRR without m/g/r Pulm-  RR and effort normal at rest, CTA without wheeze or rales Abd - soft, NT, ND, BS+,  no masses,  Nontender to palpate above the iliac crest on the side and extending towards the left lower quadrant where she intermittently feels her discomfort.  There was no bruising, no overlying skin changes, no  masses palpated, Back - no CVA tenderness, nontender in the muscle beds of the lower back with palpation Ext -nontender palpating the anterior left hip, nontender in the trochanteric bursa region, she had good hip range of motion, with no pain with flexion and extension, internal or external rotation, abduction or abduction today. Neuro/psychiatric - affect was not flat, appropriate with conversation  Alert, no facial droop, sensation intact to light touch in the face, with the facial muscle strength adequate  Grossly non-focal - good strength on testing lower extremities, sensation intact to LT in distal lower extremity on left,  Speech normal, she was able to get up the step onto the exam table today without assistance  Results for orders placed or performed in visit on 07/09/19  POCT HgB A1C  Result Value Ref Range   Hemoglobin A1C 6.5 (A) 4.0 - 5.6 %   HbA1c POC (<> result, manual entry)     HbA1c, POC (prediabetic range)     HbA1c, POC (controlled diabetic range)     Urine dip - neg except + protein    Assessment & Plan:   1. Need for influenza vaccination  - Flu Vaccine QUAD High Dose(Fluad)  2. Side pain Noted to her the exact source of this is unclear presently.  Does not seem to be a fracture concern, with good hip motion today and no pain with palpation over the iliac crest area.  Abdominal exam entirely benign.  No concerns for [place] [name] struct of kidney stone presently, with no blood in the urine.  Had no prior trauma before symptoms came on May be a strain as the source, Felt best to continue the Tylenol products, and also cold to that area as she has been doing. We will continue to closely monitor. If more concerning symptoms arise in association as we discussed, or if the symptoms become more problematic, she needs to follow-up again. - POCT Urinalysis Dipstick  3. Forehead pain Exact source of this was also unclear, with the focal tenderness suggesting a possible contusion,  although there was no bruise, swelling, or overlying skin changes of concern like a rash.  No evidence of a zoster.  No other eye symptoms of concern.  She did just have the repeat MRI with the meningioma history noted, which was reassuring.  Will also use the Tylenol products and ice to the area as she has been doing as needed, and closely monitor. If symptoms are not not improving or more problematic, she will follow-up, and if more concerning symptoms arise such as vision changes, any one-sided symptoms of concern, facial droop, more severe headache, nausea/vomiting, she may need to be seen more urgent/emergent in [name] emergency setting.  She was able to get her flu shot today.  As above, will follow-up if not improving or more problematic.        Towanda Malkin, MD 09/06/19 1:06 PM

## 2019-09-06 ENCOUNTER — Ambulatory Visit: Payer: Medicare Other | Admitting: Internal Medicine

## 2019-09-06 ENCOUNTER — Encounter: Payer: Self-pay | Admitting: Internal Medicine

## 2019-09-06 ENCOUNTER — Other Ambulatory Visit: Payer: Self-pay

## 2019-09-06 VITALS — BP 128/68 | HR 93 | Temp 97.9°F | Resp 16 | Ht 65.0 in | Wt 177.5 lb

## 2019-09-06 DIAGNOSIS — R519 Headache, unspecified: Secondary | ICD-10-CM

## 2019-09-06 DIAGNOSIS — Z23 Encounter for immunization: Secondary | ICD-10-CM | POA: Diagnosis not present

## 2019-09-06 DIAGNOSIS — R109 Unspecified abdominal pain: Secondary | ICD-10-CM | POA: Diagnosis not present

## 2019-09-06 LAB — POCT URINALYSIS DIPSTICK
Appearance: NORMAL
Bilirubin, UA: NEGATIVE
Blood, UA: NEGATIVE
Glucose, UA: NEGATIVE
Ketones, UA: NEGATIVE
Leukocytes, UA: NEGATIVE
Nitrite, UA: NEGATIVE
Odor: NORMAL
Protein, UA: POSITIVE — AB
Spec Grav, UA: >=1.03 — AB (ref 1.010–1.025)
Urobilinogen, UA: 0.2 E.U./dL
pH, UA: 6 (ref 5.0–8.0)

## 2019-09-06 NOTE — Patient Instructions (Signed)
Not entirely clear of the cause of the side pain, could be a strain as we discussed.  Also, possibly a contusion as the cause of the pain above the right eye.  We will continue Tylenol products, ice to the areas as needed, and closely monitor as we discussed, and following up if more concerns arise or symptoms not improving.

## 2019-09-10 ENCOUNTER — Other Ambulatory Visit: Payer: Self-pay | Admitting: Family Medicine

## 2019-09-10 DIAGNOSIS — M8580 Other specified disorders of bone density and structure, unspecified site: Secondary | ICD-10-CM

## 2019-09-13 ENCOUNTER — Other Ambulatory Visit: Payer: Self-pay | Admitting: Family Medicine

## 2019-09-13 DIAGNOSIS — E113553 Type 2 diabetes mellitus with stable proliferative diabetic retinopathy, bilateral: Secondary | ICD-10-CM

## 2019-09-13 DIAGNOSIS — E785 Hyperlipidemia, unspecified: Secondary | ICD-10-CM

## 2019-09-14 ENCOUNTER — Other Ambulatory Visit: Payer: Self-pay | Admitting: Family Medicine

## 2019-09-14 DIAGNOSIS — E113553 Type 2 diabetes mellitus with stable proliferative diabetic retinopathy, bilateral: Secondary | ICD-10-CM

## 2019-09-14 DIAGNOSIS — E11628 Type 2 diabetes mellitus with other skin complications: Secondary | ICD-10-CM

## 2019-09-14 DIAGNOSIS — E1169 Type 2 diabetes mellitus with other specified complication: Secondary | ICD-10-CM

## 2019-09-17 ENCOUNTER — Other Ambulatory Visit: Payer: Self-pay | Admitting: Gastroenterology

## 2019-10-31 ENCOUNTER — Other Ambulatory Visit: Payer: Self-pay | Admitting: Gastroenterology

## 2019-11-11 NOTE — Progress Notes (Signed)
Name: Beverly [name]   MRN: [id number]    DOB: 1934/02/23   Date:11/12/2019       Progress Note  Subjective  Chief Complaint  Follow up   HPI    DMII: she has been taking Metformin to 2000 mg daily,takingPrandin two  times a day before her meals, today A1C is 6.8 %  Fastinglow 100's andpost- prandially150-160's Eye exam is up to date, she sees podiatrist for her callus every 9 weeks. She denies polyphagia or polydipsia but she has nocturia - twice per night - stable. She is taking B12 sublingual three days a week. She denies side effects of medication   Syncopal episode: seen by cardiologist, Dr. Caryl Comes, echo showed normal EF. Possibly from change in bp medication because of shortage. She has not had any more episodes Had sub-dural hematoma, under the care of Dr. Lacinda Axon and had repeat MRI in 08/2019   HTN:BP is at goal, she denies dizziness, chest pain or palpitation.   Hyperlipidemia: taking Atorvastatin, no myalgias.Recheck labs today   Headache: she has noticed intermittent headache that starts on right side of head that radiates frontally, not associated with nausea, vomiting, no phonophobia or photophobia, no neuro deficit, she states ice pack and tylenol improves symptoms. She states episodes were often when her daughter was diagnosed with recurrence of cancer, but now that her daughter is back to work she is doing better. She states episodes down to at most once a week.   AR: well controlled,taking medication prn and is doing well at this time, no rhinorrhea or nasal congestion at this time.Stable   OA: she has right knee pain, it started after a fall 35plus years agosecondary to a fall, she takes arthritis pain pill otc Pain is described as dull and aching, intermittent but is doing well on tylenol.She denies an effusion. She also has some pain on left knee   Unintentional weight loss/Protein malnutrition : she hadlost 18 lbsfrom 2018 to 2019.She statesshe  wasunder a lot of stress since Summer of 2018 worried about grand-daughteralso her daughter was diagnosed with uterine cancer. Since she lost weight, she got motivated to eat healthier, she feel Summer 2020 and broker her dentures during a syncopal episode. Explained that we can look for occult cancer but she is still not interested. Today a few pounds lower but not dropping as fast . 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, without long-term current use of insulin ([place]) 02/27/2018   Type 2 diabetes mellitus with pressure callus ([place]) 10/24/2017   Diabetic retinopathy ([place]) 11/12/2015   Obesity (BMI 30.0-34.9) 06/02/2015   Osteoarthritis 06/02/2015   Osteopenia 06/02/2015   Narrowing of intervertebral disc space 06/02/2015   Dyslipidemia associated with type 2 diabetes mellitus ([place]) 06/02/2015   Perennial allergic rhinitis 02/28/2007   Benign essential HTN 06/26/2006   Pure hypercholesterolemia 06/26/2006    Past Surgical History:  Procedure Laterality Date   ABDOMINAL HYSTERECTOMY     BUNIONECTOMY     COLONOSCOPY     COLONOSCOPY WITH PROPOFOL N/A 09/24/2018   Procedure: COLONOSCOPY WITH PROPOFOL;  Surgeon: Lin Landsman, MD;  Location: [facility] ENDOSCOPY;  Service: Gastroenterology;  Laterality: N/A;   ESOPHAGOGASTRODUODENOSCOPY (EGD) WITH PROPOFOL N/A 09/24/2018  Procedure: ESOPHAGOGASTRODUODENOSCOPY (EGD) WITH PROPOFOL;  Surgeon: Lin Landsman, MD;  Location: [facility] ENDOSCOPY;  Service: Gastroenterology;  Laterality: N/A;   GIVENS CAPSULE STUDY N/A 10/11/2018   Procedure: GIVENS CAPSULE STUDY;  Surgeon: Lin Landsman, MD;  Location[facility] ENDOSCOPY;  Service: Gastroenterology;  Laterality: N/A;   HERNIA REPAIR       Family History  Problem Relation Age of Onset   Hypertension Mother    Healthy Father    Healthy Daughter    Raynaud syndrome Daughter    Hypertension Daughter    Stroke Sister     Social History   Tobacco Use   Smoking status: Never Smoker   Smokeless tobacco: Never Used   Tobacco comment: smoking cessation materials not required  Substance Use Topics   Alcohol use: No    Alcohol/week: 0.0 standard drinks     Current Outpatient Medications:    alendronate (FOSAMAX) 35 MG tablet, TAKE 1 TABLET BY MOUTH WEEKLY WITH A FULL GLASS OF WATER ON AN EMPTY STOMACH., Disp: 12 tablet, Rfl: 1   atorvastatin (LIPITOR) 40 MG tablet, Take 1 tablet (40 mg total) by mouth daily., Disp: 90 tablet, Rfl: 1   Cholecalciferol (VITAMIN D) 2000 units CAPS, Take 1 capsule (2,000 Units total) by mouth daily., Disp: 30 capsule, Rfl: 0   ferrous sulfate 325 (65 FE) MG tablet, Take 325 mg by mouth 2 (two) times daily with a meal. , Disp: , Rfl:    furosemide (LASIX) 40 MG tablet, Take 1 tablet (40 mg total) by mouth daily as needed., Disp: 90 tablet, Rfl: 0   Glucosamine Sulfate 500 MG TABS, Take 1 tablet by mouth once a week. , Disp: , Rfl:    loratadine (CLARITIN) 10 MG tablet, Take 1 tablet (10 mg total) by mouth daily., Disp: 90 tablet, Rfl: 1   losartan (COZAAR) 25 MG tablet, Take 25 mg by mouth daily., Disp: , Rfl:    losartan (COZAAR) 50 MG tablet, Take 1 tablet (50 mg total) by mouth daily., Disp: 90 tablet, Rfl: 1   metFORMIN (GLUCOPHAGE) 1000 MG tablet, TAKE 1 TABLET (1,000 MG TOTAL) BY MOUTH 2 (TWO) TIMES DAILY WITH A MEAL., Disp: 180 tablet, Rfl: 1   montelukast (SINGULAIR) 10 MG tablet, TAKE 1 TABLET BY MOUTH EVERYDAY AT BEDTIME, Disp: 90 tablet, Rfl: 2   Multiple Vitamin (MULTI-VITAMINS) TABS, Take by mouth., Disp: , Rfl:    omeprazole (PRILOSEC) 20 MG capsule, TAKE 1 CAPSULE (20 MG TOTAL) BY MOUTH 2 (TWO) TIMES DAILY BEFORE A MEAL., Disp: 180 capsule, Rfl: 0    Potassium Chloride ER 20 MEQ TBCR, Take 10 mEq by mouth daily as needed. With lasix, Disp: , Rfl:    repaglinide (PRANDIN) 2 MG tablet, Take 1 tablet (2 mg total) by mouth 2 (two) times daily before a meal., Disp: 180 tablet, Rfl: 1   vitamin B-12 (CYANOCOBALAMIN) 1000 MCG tablet, Take 1,000 mcg by mouth 3 (three) times a week., Disp: , Rfl:    vitamin C (ASCORBIC ACID) 500 MG tablet, Take 500 mg by mouth daily., Disp: , Rfl:    vitamin e 100 UNT/0.25ML OIL oil, Take by mouth daily., Disp: , Rfl:    Zinc Acetate 50 MG CAPS, Take by mouth daily. , Disp: , Rfl:   Allergies  Allergen Reactions   Lisinopril     I personally reviewed active problem list, medication list, allergies, family history, social history, health maintenance with the patient/caregiver today.   ROS  Constitutional: Negative for  fever , positive for weight change.  Respiratory: Negative for cough and shortness of breath.   Cardiovascular: Negative for chest pain or palpitations.  Gastrointestinal: Negative for abdominal pain, no bowel changes.  Musculoskeletal: Negative for gait problem or joint swelling.  Skin: Negative for rash.  Neurological: Negative for dizziness , positive for intermittent  headache.  No other specific complaints in a complete review of systems (except as listed in HPI above).  Objective  Vitals:   11/12/19 1110  BP: 130/82  Pulse: 90  Resp: 16  Temp: 98.1 F (36.7 C)  TempSrc: Oral  SpO2: 99%  Weight: 177 lb 14.4 oz (80.7 kg)  Height: 5\' 5"  (1.651 m)    Body mass index is 29.6 kg/m.  Physical Exam  Constitutional: Patient appears well-developed , some temporal waisting  No distress.  HEENT: head atraumatic, normocephalic, pupils equal and reactive to light,  neck supple Cardiovascular: Normal rate, regular rhythm and normal heart sounds.  No murmur heard. No BLE edema. Pulmonary/Chest: Effort normal and breath sounds normal. No respiratory distress. Abdominal: Soft.   There is no tenderness. Psychiatric: Patient has a normal mood and affect. behavior is normal. Judgment and thought content normal.  Recent Results (from the past 2160 hour(s))  POCT Urinalysis Dipstick     Status: Abnormal   Collection Time: 09/06/19  1:49 PM  Result Value Ref Range   Color, UA dark yellow    Clarity, UA slightyl cloudy    Glucose, UA Negative Negative   Bilirubin, UA negative    Ketones, UA Negative    Spec Grav, UA >=1.030 (A) 1.010 - 1.025   Blood, UA negative    pH, UA 6.0 5.0 - 8.0   Protein, UA Positive (A) Negative   Urobilinogen, UA 0.2 0.2 or 1.0 E.U./dL   Nitrite, UA Negative    Leukocytes, UA Negative Negative   Appearance normal    Odor normal   POCT HgB A1C     Status: Abnormal   Collection Time: 11/12/19 11:13 AM  Result Value Ref Range   Hemoglobin A1C 6.8 (A) 4.0 - 5.6 %   HbA1c POC (<> result, manual entry)     HbA1c, POC (prediabetic range)     HbA1c, POC (controlled diabetic range)       PHQ2/9: Depression screen [facility] 2/9 11/12/2019 09/06/2019 07/09/2019 04/16/2019 03/06/2019  Decreased Interest 0 0 0 0 1  Down, Depressed, Hopeless 0 0 0 0 0  PHQ - 2 Score 0 0 0 0 1  Altered sleeping - - 0 - 0  Tired, decreased energy - - 0 - 0  Change in appetite - - 0 - 0  Feeling bad or failure about yourself  - - 0 - 0  Trouble concentrating - - 0 - -  Moving slowly or fidgety/restless - - 0 - 0  Suicidal thoughts - - 0 - 0  PHQ-9 Score - - 0 - 1  Difficult doing work/chores - - - - -  Some recent data might be hidden    phq 9 is negative   Fall Risk: Fall Risk  11/12/2019 09/06/2019 07/09/2019 04/16/2019 03/06/2019  Falls in the past year? 0 0 0 1 1  Number falls in past yr: 0 0 1 0 0  Injury with Fall? 0 0 0 1 1  Comment - - - - hurt her knees, dentures  Risk for fall due to : - - - History of fall(s) -  Risk for fall due to:  Comment - - - - -  Follow up - Falls evaluation completed - Falls prevention discussed -      Assessment & Plan   1.  Dyslipidemia associated with type 2 diabetes mellitus (HCC)  - POCT HgB A1C - Lipid panel - atorvastatin (LIPITOR) 40 MG tablet; Take 1 tablet (40 mg total) by mouth daily.  Dispense: 90 tablet; Refill: 1 - repaglinide (PRANDIN) 2 MG tablet; Take 1 tablet (2 mg total) by mouth 2 (two) times daily before a meal.  Dispense: 180 tablet; Refill: 1  2. 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  Dispense: 180 tablet; Refill: 1  3. Type 2 diabetes mellitus with pressure callus (HCC)  - repaglinide (PRANDIN) 2 MG tablet; Take 1 tablet (2 mg total) by mouth 2 (two) times daily before a meal.  Dispense: 180 tablet; Refill: 1  4. Dyslipidemia  - Lipid panel - atorvastatin (LIPITOR) 40 MG tablet; Take 1 tablet (40 mg total) by mouth daily.  Dispense: 90 tablet; Refill: 1  5. B12 deficiency  Taking otc supplementation   6. Essential (primary) hypertension  - COMPLETE METABOLIC PANEL WITH GFR - CBC with Differential/Platelet - Microalbumin / creatinine urine ratio - losartan (COZAAR) 50 MG tablet; Take 1 tablet (50 mg total) by mouth daily.  Dispense: 90 tablet; Refill: 1  7. History of subdural hematoma   8. Benign essential HTN  At goal   9. Primary osteoarthritis of right knee   10. Controlled type 2 diabetes mellitus with microalbuminuria, without long-term current use of insulin (HCC)  - Microalbumin / creatinine urine ratio

## 2019-11-12 ENCOUNTER — Other Ambulatory Visit: Payer: Self-pay

## 2019-11-12 ENCOUNTER — Ambulatory Visit (INDEPENDENT_AMBULATORY_CARE_PROVIDER_SITE_OTHER): Payer: Medicare Other | Admitting: Family Medicine

## 2019-11-12 ENCOUNTER — Encounter: Payer: Self-pay | Admitting: Family Medicine

## 2019-11-12 VITALS — BP 130/82 | HR 90 | Temp 98.1°F | Resp 16 | Ht 65.0 in | Wt 177.9 lb

## 2019-11-12 DIAGNOSIS — E1129 Type 2 diabetes mellitus with other diabetic kidney complication: Secondary | ICD-10-CM

## 2019-11-12 DIAGNOSIS — E113553 Type 2 diabetes mellitus with stable proliferative diabetic retinopathy, bilateral: Secondary | ICD-10-CM | POA: Diagnosis not present

## 2019-11-12 DIAGNOSIS — E1169 Type 2 diabetes mellitus with other specified complication: Secondary | ICD-10-CM | POA: Diagnosis not present

## 2019-11-12 DIAGNOSIS — R809 Proteinuria, unspecified: Secondary | ICD-10-CM

## 2019-11-12 DIAGNOSIS — Z8679 Personal history of other diseases of the circulatory system: Secondary | ICD-10-CM

## 2019-11-12 DIAGNOSIS — I10 Essential (primary) hypertension: Secondary | ICD-10-CM

## 2019-11-12 DIAGNOSIS — E11628 Type 2 diabetes mellitus with other skin complications: Secondary | ICD-10-CM

## 2019-11-12 DIAGNOSIS — E785 Hyperlipidemia, unspecified: Secondary | ICD-10-CM

## 2019-11-12 DIAGNOSIS — L84 Corns and callosities: Secondary | ICD-10-CM

## 2019-11-12 DIAGNOSIS — E538 Deficiency of other specified B group vitamins: Secondary | ICD-10-CM

## 2019-11-12 DIAGNOSIS — M1711 Unilateral primary osteoarthritis, right knee: Secondary | ICD-10-CM

## 2019-11-12 LAB — POCT GLYCOSYLATED HEMOGLOBIN (HGB A1C): Hemoglobin A1C: 6.8 % — AB (ref 4.0–5.6)

## 2019-11-12 MED ORDER — LOSARTAN POTASSIUM 50 MG PO TABS: 50.0000 mg | ORAL_TABLET | Freq: Every day | ORAL | 1 refills | Status: DC

## 2019-11-12 MED ORDER — REPAGLINIDE 2 MG PO TABS: 2.0000 mg | ORAL_TABLET | Freq: Two times a day (BID) | ORAL | 1 refills | Status: DC

## 2019-11-12 MED ORDER — ATORVASTATIN CALCIUM 40 MG PO TABS: 40.0000 mg | ORAL_TABLET | Freq: Every day | ORAL | 1 refills | Status: DC

## 2019-11-13 LAB — CBC WITH DIFFERENTIAL/PLATELET
Absolute Monocytes: 260 cells/uL (ref 200–950)
Basophils Absolute: 19 cells/uL (ref 0–200)
Basophils Relative: 0.3 %
Eosinophils Absolute: 81 cells/uL (ref 15–500)
Eosinophils Relative: 1.3 %
HCT: 34.6 % — ABNORMAL LOW (ref 35.0–45.0)
Hemoglobin: 11.4 g/dL — ABNORMAL LOW (ref 11.7–15.5)
Lymphs Abs: 3633 cells/uL (ref 850–3900)
MCH: 31.3 pg (ref 27.0–33.0)
MCHC: 32.9 g/dL (ref 32.0–36.0)
MCV: 95.1 fL (ref 80.0–100.0)
MPV: 11.9 fL (ref 7.5–12.5)
Monocytes Relative: 4.2 %
Neutro Abs: 2207 cells/uL (ref 1500–7800)
Neutrophils Relative %: 35.6 %
Platelets: 218 10*3/uL (ref 140–400)
RBC: 3.64 10*6/uL — ABNORMAL LOW (ref 3.80–5.10)
RDW: 13.6 % (ref 11.0–15.0)
Total Lymphocyte: 58.6 %
WBC: 6.2 10*3/uL (ref 3.8–10.8)

## 2019-11-13 LAB — COMPLETE METABOLIC PANEL WITH GFR
AG Ratio: 1.4 (calc) (ref 1.0–2.5)
ALT: 7 U/L (ref 6–29)
AST: 17 U/L (ref 10–35)
Albumin: 3.9 g/dL (ref 3.6–5.1)
Alkaline phosphatase (APISO): 53 U/L (ref 37–153)
BUN: 11 mg/dL (ref 7–25)
CO2: 30 mmol/L (ref 20–32)
Calcium: 9.2 mg/dL (ref 8.6–10.4)
Chloride: 104 mmol/L (ref 98–110)
Creat: 0.73 mg/dL (ref 0.60–0.88)
GFR, Est African American: 88 mL/min/{1.73_m2} (ref >=60)
GFR, Est Non African American: 76 mL/min/{1.73_m2} (ref >=60)
Globulin: 2.8 g/dL (calc) (ref 1.9–3.7)
Glucose, Bld: 99 mg/dL (ref 65–99)
Potassium: 4.2 mmol/L (ref 3.5–5.3)
Sodium: 141 mmol/L (ref 135–146)
Total Bilirubin: 0.5 mg/dL (ref 0.2–1.2)
Total Protein: 6.7 g/dL (ref 6.1–8.1)

## 2019-11-13 LAB — LIPID PANEL
Cholesterol: 139 mg/dL (ref <200)
HDL: 58 mg/dL (ref >=50)
LDL Cholesterol (Calc): 66 mg/dL (calc)
Non-HDL Cholesterol (Calc): 81 mg/dL (calc) (ref <130)
Total CHOL/HDL Ratio: 2.4 (calc) (ref <5.0)
Triglycerides: 73 mg/dL (ref <150)

## 2019-11-13 LAB — MICROALBUMIN / CREATININE URINE RATIO
Creatinine, Urine: 104 mg/dL (ref 20–275)
Microalb Creat Ratio: 15 mcg/mg creat (ref <30)
Microalb, Ur: 1.6 mg/dL

## 2020-01-13 ENCOUNTER — Telehealth: Payer: Self-pay

## 2020-01-13 NOTE — Telephone Encounter (Signed)
Copied from [place] [id number]. Topic: General - Other >> Jan 10, 2020  4:29 PM Celene Kras wrote: Reason for CRM: Pt called and is requesting to have a nurse give her a call back. She states that her medication, losartan, has been increased and it was not discussed with her first. Please advise.

## 2020-02-16 ENCOUNTER — Other Ambulatory Visit: Payer: Self-pay | Admitting: Gastroenterology

## 2020-02-22 ENCOUNTER — Other Ambulatory Visit: Payer: Self-pay | Admitting: Family Medicine

## 2020-02-22 DIAGNOSIS — M8580 Other specified disorders of bone density and structure, unspecified site: Secondary | ICD-10-CM

## 2020-02-22 NOTE — Telephone Encounter (Signed)
Requested Prescriptions  Pending Prescriptions Disp Refills  . alendronate (FOSAMAX) 35 MG tablet [Pharmacy Med Name: ALENDRONATE SODIUM 35 MG TAB] 12 tablet 1    Sig: TAKE 1 TABLET BY MOUTH WEEKLY WITH A FULL GLASS OF WATER ON AN EMPTY STOMACH.     Endocrinology:  Bisphosphonates Failed - 02/22/2020 10:05 AM      Failed - Vitamin D in normal range and within 360 days    Vit D, 25-Hydroxy  Date Value Ref Range Status  06/05/2015 44.7 30.0 - 100.0 ng/mL Final    Comment:    Vitamin D deficiency has been defined by the [place] practice guideline as a level of serum 25-OH vitamin D less than 20 ng/mL (1,2). The Endocrine Society went on to further define vitamin D insufficiency as a level between 21 and 29 ng/mL (2). 1. IOM (Institute of Medicine). 2010. Dietary reference    intakes for calcium and D. [place]: The    [employer]. 2. Holick MF, Binkley [place], Bischoff-Ferrari HA, et al.    Evaluation, treatment, and prevention of vitamin D    deficiency: an Endocrine Society clinical practice    guideline. JCEM. 2011 Jul; 96(7):1911-30.          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)   [facility] Steele Sizer, MD      Future Appointments            In 2 weeks Ancil Boozer, Drue Stager, MD [facility] [place]   In 1 month  [place]

## 2020-03-02 ENCOUNTER — Other Ambulatory Visit: Payer: Self-pay | Admitting: Gastroenterology

## 2020-03-09 NOTE — Progress Notes (Signed)
Name: Beverly [name]   MRN: [id number]    DOB: [birth year]   Date:03/10/2020       Progress Note  Subjective  Chief Complaint  Follow Up  HPI  DMII: she has been taking Metformin to 2000 mg daily,takingPrandin two  times a day before her meals, today A1C is down from 6.8 % to 6.3 %   Fastinglow 95-98  andpost- prandially150-155 Eye exam is up to date and we will get a copy , she sees podiatrist for her callus every 9 weeks and states last visit one week ago . She denies polyphagia or polydipsia but she has nocturia - once or twice per night  - stable. She is taking B12 sublingual three days a week. She denies side effects of medication   Syncopal episode: seen by cardiologist, Dr. Caryl Comes, echo showed normal EF. Possibly from change in bp medication because of shortage. She has not had any more episodes Had sub-dural hematoma, she had a repeat MRI done 08/2019 and showed a very mild increase in size. She states she will go back in 18 months  HTN:BP is at goal, she denies dizziness, chest pain or palpitation.   Hyperlipidemia: taking Atorvastatin, no myalgias.Reviewed last labs and it was at goal   Headache: she has noticed intermittent headache that starts on right side of head that radiates frontally, not associated with nausea, vomiting, no phonophobia or photophobia, no neuro deficit, she states ice pack and tylenol improves symptoms. She states since stress is down, no longer having problems   AR: well controlled,taking medication prn and is doing well at this time, no rhinorrhea or nasal congestion at this time.Stable   OA:  it started after a fall 35plus years agosecondary to a fall, initially right knee, but now seems to be worse on the left side.  Pain is sharp on the left knee, and feels weak on left leg. Taking medication   Unintentional weight loss/Protein malnutrition : she hadlost 18 lbsfrom 2018 to 2019.She statesshe wasunder a lot of stress since Summer  of 2018 worried about grand-daughteralso her daughter was diagnosed with uterine cancer. Since she lost weight, she got motivated to eat healthier, she feel Summer 2020 and broke her dentures during a syncopal episode.  Weight is stable now, still has anemia and she will increase supplementation to bid again Discussed checking for occult blood but she is not interested.   Patient Active Problem List   Diagnosis Date Noted  . Iron deficiency anemia   . Subdural hematoma ([place])   . Syncope and collapse 07/24/2018  . 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 Type 2 diabetes mellitus with pressure callus ([place]) 10/24/2017  . Diabetic retinopathy ([place]) 11/12/2015  . Obesity (BMI 30.0-34.9) 06/02/2015  . Osteoarthritis 06/02/2015  . Osteopenia 06/02/2015  . Narrowing of intervertebral disc space 06/02/2015  . Dyslipidemia associated with type 2 diabetes mellitus ([place]) 06/02/2015  . Perennial allergic rhinitis 02/28/2007  . Benign essential HTN 06/26/2006  . Pure hypercholesterolemia 06/26/2006    Past Surgical History:  Procedure Laterality Date  . ABDOMINAL HYSTERECTOMY    . BUNIONECTOMY    . COLONOSCOPY    . COLONOSCOPY WITH PROPOFOL N/A 09/24/2018   Procedure: COLONOSCOPY WITH PROPOFOL;  Surgeon: Lin Landsman, MD;  Location: [facility] ENDOSCOPY;  Service: Gastroenterology;  Laterality: N/A;  . ESOPHAGOGASTRODUODENOSCOPY (EGD) WITH PROPOFOL N/A 09/24/2018   Procedure: ESOPHAGOGASTRODUODENOSCOPY (EGD) WITH PROPOFOL;  Surgeon: Lin Landsman, MD;  Location: [facility] ENDOSCOPY;  Service: Gastroenterology;  Laterality: N/A;  . GIVENS CAPSULE STUDY N/A 10/11/2018   Procedure: GIVENS CAPSULE STUDY;  Surgeon: Lin Landsman, MD;  Location: [facility] ENDOSCOPY;  Service: Gastroenterology;  Laterality: N/A;  . HERNIA REPAIR      Family History  Problem Relation Age of Onset  . Hypertension Mother    . Healthy Father   . Healthy Daughter   . Raynaud syndrome Daughter   . Hypertension Daughter   . Stroke Sister     Social History   Tobacco Use  . Smoking status: Never Smoker  . Smokeless tobacco: Never Used  . Tobacco comment: smoking cessation materials not required  Substance Use Topics  . Alcohol use: No    Alcohol/week: 0.0 standard drinks     Current Outpatient Medications:  .  alendronate (FOSAMAX) 35 MG tablet, TAKE 1 TABLET BY MOUTH WEEKLY WITH A FULL GLASS OF WATER ON AN EMPTY STOMACH., Disp: 12 tablet, Rfl: 1 .  atorvastatin (LIPITOR) 40 MG tablet, Take 1 tablet (40 mg total) by mouth daily., Disp: 90 tablet, Rfl: 1 .  Cholecalciferol (VITAMIN D) 2000 units CAPS, Take 1 capsule (2,000 Units total) by mouth daily., Disp: 30 capsule, Rfl: 0 .  ferrous sulfate 325 (65 FE) MG tablet, Take 325 mg by mouth 2 (two) times daily with a meal. , Disp: , Rfl:  .  furosemide (LASIX) 40 MG tablet, Take 1 tablet (40 mg total) by mouth daily as needed., Disp: 90 tablet, Rfl: 0 .  Glucosamine Sulfate 500 MG TABS, Take 1 tablet by mouth once a week. , Disp: , Rfl:  .  loratadine (CLARITIN) 10 MG tablet, Take 1 tablet (10 mg total) by mouth daily., Disp: 90 tablet, Rfl: 1 .  losartan (COZAAR) 50 MG tablet, Take 1 tablet (50 mg total) by mouth daily., Disp: 90 tablet, Rfl: 1 .  metFORMIN (GLUCOPHAGE) 1000 MG tablet, TAKE 1 TABLET (1,000 MG TOTAL) BY MOUTH 2 (TWO) TIMES DAILY WITH A MEAL., Disp: 180 tablet, Rfl: 1 .  montelukast (SINGULAIR) 10 MG tablet, TAKE 1 TABLET BY MOUTH EVERYDAY AT BEDTIME, Disp: 90 tablet, Rfl: 2 .  Multiple Vitamin (MULTI-VITAMINS) TABS, Take by mouth., Disp: , Rfl:  .  omeprazole (PRILOSEC) 20 MG capsule, TAKE 1 CAPSULE (20 MG TOTAL) BY MOUTH 2 (TWO) TIMES DAILY BEFORE A MEAL., Disp: 180 capsule, Rfl: 0 .  Potassium Chloride ER 20 MEQ TBCR, Take 10 mEq by mouth daily as needed. With lasix, Disp: , Rfl:  .  repaglinide (PRANDIN) 2 MG tablet, Take 1 tablet (2 mg  total) by mouth 2 (two) times daily before a meal., Disp: 180 tablet, Rfl: 1 .  vitamin B-12 (CYANOCOBALAMIN) 1000 MCG tablet, Take 1,000 mcg by mouth 3 (three) times a week., Disp: , Rfl:  .  vitamin C (ASCORBIC ACID) 500 MG tablet, Take 500 mg by mouth daily., Disp: , Rfl:  .  vitamin e 100 UNT/0.25ML OIL oil, Take by mouth daily., Disp: , Rfl:  .  Zinc Acetate 50 MG CAPS, Take by mouth daily. , Disp: , Rfl:   Allergies  Allergen Reactions  . Lisinopril     I personally reviewed active problem list, medication list, allergies, family history, social history, health maintenance with the patient/caregiver today.   ROS  Constitutional: Negative for fever or weight change.  Respiratory: Negative for cough and shortness of breath.   Cardiovascular: Negative for chest pain or palpitations.  Gastrointestinal: Negative for abdominal pain, no bowel changes.  Musculoskeletal: Negative for gait problem or joint swelling.  Skin: Negative for rash.  Neurological: Negative for dizziness or headache.  No other specific complaints in a complete review of systems (except as listed in HPI above).  Objective  Vitals:   03/10/20 1028 03/10/20 1035  BP: (!) 144/98 138/80  Pulse: 97   Resp: 16   Temp: 98.5 F (36.9 C)   TempSrc: Oral   SpO2: 99%   Weight: 177 lb (80.3 kg)   Height: 5\' 5"  (1.651 m)     Body mass index is 29.45 kg/m.  Physical Exam  Constitutional: Patient appears well-developed and well-nourished. Overweight.  No distress.  HEENT: head atraumatic, normocephalic, pupils equal and reactive to light,  neck supple Cardiovascular: Normal rate, regular rhythm and normal heart sounds.  No murmur heard. Non pitting trace of  BLE edema. Pulmonary/Chest: Effort normal and breath sounds normal. No respiratory distress. Abdominal: Soft.  There is no tenderness. Psychiatric: Patient has a normal mood and affect. behavior is normal. Judgment and thought content normal.  Recent  Results (from the past 2160 hour(s))  POCT HgB A1C     Status: Abnormal   Collection Time: 03/10/20 10:35 AM  Result Value Ref Range   Hemoglobin A1C 6.3 (A) 4.0 - 5.6 %   HbA1c POC (<> result, manual entry)     HbA1c, POC (prediabetic range)     HbA1c, POC (controlled diabetic range)        PHQ2/9: Depression screen [facility] 2/9 03/10/2020 11/12/2019 09/06/2019 07/09/2019 04/16/2019  Decreased Interest 0 0 0 0 0  Down, Depressed, Hopeless 0 0 0 0 0  PHQ - 2 Score 0 0 0 0 0  Altered sleeping - - - 0 -  Tired, decreased energy - - - 0 -  Change in appetite - - - 0 -  Feeling bad or failure about yourself  - - - 0 -  Trouble concentrating - - - 0 -  Moving slowly or fidgety/restless - - - 0 -  Suicidal thoughts - - - 0 -  PHQ-9 Score - - - 0 -  Difficult doing work/chores - - - - -  Some recent data might be hidden    phq 9 is negative   Fall Risk: Fall Risk  03/10/2020 11/12/2019 09/06/2019 07/09/2019 04/16/2019  Falls in the past year? 0 0 0 0 1  Number falls in past yr: 0 0 0 1 0  Injury with Fall? 0 0 0 0 1  Comment - - - - -  Risk for fall due to : - - - - History of fall(s)  Risk for fall due to: Comment - - - - -  Follow up - - Falls evaluation completed - Falls prevention discussed    Functional Status Survey: Is the patient deaf or have difficulty hearing?: No Does the patient have difficulty seeing, even when wearing glasses/contacts?: No Does the patient have difficulty concentrating, remembering, or making decisions?: No Does the patient have difficulty walking or climbing stairs?: No Does the patient have difficulty dressing or bathing?: No Does the patient have difficulty doing errands alone such as visiting a doctor's office or shopping?: No    Assessment & Plan  1. Dyslipidemia associated with type 2 diabetes mellitus (HCC)  - POCT HgB A1C - repaglinide (PRANDIN) 2 MG tablet; Take 1 tablet (2 mg total) by mouth 2 (two) times daily before a meal.  Dispense: 180  tablet; Refill: 1 - metFORMIN (GLUCOPHAGE) 1000 MG tablet; Take 1 tablet (1,000 mg total) by mouth 2 (two) times daily with a meal.  Dispense: 180 tablet; Refill: 1  2. B12 deficiency  Continue otc supplementation   3. 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  Dispense: 180 tablet; Refill: 1 - metFORMIN (GLUCOPHAGE) 1000 MG tablet; Take 1 tablet (1,000 mg total) by mouth 2 (two) times daily with a meal.  Dispense: 180 tablet; Refill: 1  4. Type 2 diabetes mellitus with pressure callus (HCC)  - repaglinide (PRANDIN) 2 MG tablet; Take 1 tablet (2 mg total) by mouth 2 (two) times daily before a meal.  Dispense: 180 tablet; Refill: 1  5. Essential (primary) hypertension   6. Dyslipidemia   7. History of subdural hematoma   8. Primary osteoarthritis of right knee   9. Controlled type 2 diabetes mellitus with microalbuminuria, without long-term current use of insulin ([place])

## 2020-03-10 ENCOUNTER — Ambulatory Visit: Payer: Medicare Other | Admitting: Family Medicine

## 2020-03-10 ENCOUNTER — Other Ambulatory Visit: Payer: Self-pay

## 2020-03-10 ENCOUNTER — Encounter: Payer: Self-pay | Admitting: Family Medicine

## 2020-03-10 VITALS — BP 138/80 | HR 97 | Temp 98.5°F | Resp 16 | Ht 65.0 in | Wt 177.0 lb

## 2020-03-10 DIAGNOSIS — E11628 Type 2 diabetes mellitus with other skin complications: Secondary | ICD-10-CM | POA: Diagnosis not present

## 2020-03-10 DIAGNOSIS — E1129 Type 2 diabetes mellitus with other diabetic kidney complication: Secondary | ICD-10-CM

## 2020-03-10 DIAGNOSIS — L84 Corns and callosities: Secondary | ICD-10-CM

## 2020-03-10 DIAGNOSIS — E538 Deficiency of other specified B group vitamins: Secondary | ICD-10-CM

## 2020-03-10 DIAGNOSIS — I10 Essential (primary) hypertension: Secondary | ICD-10-CM

## 2020-03-10 DIAGNOSIS — E785 Hyperlipidemia, unspecified: Secondary | ICD-10-CM

## 2020-03-10 DIAGNOSIS — E113553 Type 2 diabetes mellitus with stable proliferative diabetic retinopathy, bilateral: Secondary | ICD-10-CM | POA: Diagnosis not present

## 2020-03-10 DIAGNOSIS — M1711 Unilateral primary osteoarthritis, right knee: Secondary | ICD-10-CM

## 2020-03-10 DIAGNOSIS — Z8679 Personal history of other diseases of the circulatory system: Secondary | ICD-10-CM

## 2020-03-10 DIAGNOSIS — E1169 Type 2 diabetes mellitus with other specified complication: Secondary | ICD-10-CM

## 2020-03-10 DIAGNOSIS — R809 Proteinuria, unspecified: Secondary | ICD-10-CM

## 2020-03-10 LAB — POCT GLYCOSYLATED HEMOGLOBIN (HGB A1C): Hemoglobin A1C: 6.3 % — AB (ref 4.0–5.6)

## 2020-03-10 MED ORDER — REPAGLINIDE 2 MG PO TABS: 2.0000 mg | ORAL_TABLET | Freq: Two times a day (BID) | ORAL | 1 refills | Status: DC

## 2020-03-10 MED ORDER — METFORMIN HCL 1000 MG PO TABS: 1000.0000 mg | ORAL_TABLET | Freq: Two times a day (BID) | ORAL | 1 refills | Status: DC

## 2020-03-13 ENCOUNTER — Other Ambulatory Visit: Payer: Self-pay | Admitting: Family Medicine

## 2020-03-13 DIAGNOSIS — J3089 Other allergic rhinitis: Secondary | ICD-10-CM

## 2020-04-16 ENCOUNTER — Ambulatory Visit (INDEPENDENT_AMBULATORY_CARE_PROVIDER_SITE_OTHER): Payer: Medicare Other

## 2020-04-16 ENCOUNTER — Other Ambulatory Visit: Payer: Self-pay

## 2020-04-16 VITALS — BP 130/68 | HR 96 | Temp 98.0°F | Resp 16 | Ht 65.0 in | Wt 177.1 lb

## 2020-04-16 DIAGNOSIS — Z0000 Encounter for general adult medical examination without abnormal findings: Secondary | ICD-10-CM | POA: Diagnosis not present

## 2020-04-16 NOTE — Patient Instructions (Signed)
Beverly [name] , Thank you for taking time to come for your Medicare Wellness Visit. I appreciate your ongoing commitment to your health goals. Please review the following plan we discussed and let me know if I can assist you in the future.   Screening recommendations/referrals: Colonoscopy: no longer required Mammogram: no longer required Bone Density: no longer required Recommended yearly ophthalmology/optometry visit for glaucoma screening and checkup Recommended yearly dental visit for hygiene and checkup  Vaccinations: Influenza vaccine: done 09/06/19 Pneumococcal vaccine: done 09/17/13 Tdap vaccine: done 07/24/18 Shingles vaccine: done 10/23/19 & 12/20/19   Covid-19:done 01/24/19, 02/14/19 & 02/18/20  Advanced directives: Advance directive discussed with you today. I have provided a copy for you to complete at home and have notarized. Once this is complete please bring a copy in to our office so we can scan it into your chart.  Conditions/risks identified: Keep up the great work!   Next appointment: Follow up in one year for your annual wellness visit    Preventive Care 65 Years and Older, Female Preventive care refers to lifestyle choices and visits with your health care provider that can promote health and wellness. What does preventive care include?  A yearly physical exam. This is also called an annual well check.  Dental exams once or twice a year.  Routine eye exams. Ask your health care provider how often you should have your eyes checked.  Personal lifestyle choices, including:  Daily care of your teeth and gums.  Regular physical activity.  Eating a healthy diet.  Avoiding tobacco and drug use.  Limiting alcohol use.  Practicing safe sex.  Taking low-dose aspirin every day.  Taking vitamin and mineral supplements as recommended by your health care provider. What happens during an annual well check? The services and screenings done by your health care provider  during your annual well check will depend on your age, overall health, lifestyle risk factors, and family history of disease. Counseling  Your health care provider may ask you questions about your:  Alcohol use.  Tobacco use.  Drug use.  Emotional well-being.  Home and relationship well-being.  Sexual activity.  Eating habits.  History of falls.  Memory and ability to understand (cognition).  Work and work Statistician.  Reproductive health. Screening  You may have the following tests or measurements:  Height, weight, and BMI.  Blood pressure.  Lipid and cholesterol levels. These may be checked every 5 years, or more frequently if you are over 40 years old.  Skin check.  Lung cancer screening. You may have this screening every year starting at age 72 if you have a 30-pack-year history of smoking and currently smoke or have quit within the past 15 years.  Fecal occult blood test (FOBT) of the stool. You may have this test every year starting at age 7.  Flexible sigmoidoscopy or colonoscopy. You may have a sigmoidoscopy every 5 years or a colonoscopy every 10 years starting at age 22.  Hepatitis C blood test.  Hepatitis B blood test.  Sexually transmitted disease (STD) testing.  Diabetes screening. This is done by checking your blood sugar (glucose) after you have not eaten for a while (fasting). You may have this done every 1-3 years.  Bone density scan. This is done to screen for osteoporosis. You may have this done starting at age 46.  Mammogram. This may be done every 1-2 years. Talk to your health care provider about how often you should have regular mammograms. Talk with your health  care provider about your test results, treatment options, and if necessary, the need for more tests. Vaccines  Your health care provider may recommend certain vaccines, such as:  Influenza vaccine. This is recommended every year.  Tetanus, diphtheria, and acellular pertussis  (Tdap, Td) vaccine. You may need a Td booster every 10 years.  Zoster vaccine. You may need this after age 42.  Pneumococcal 13-valent conjugate (PCV13) vaccine. One dose is recommended after age 47.  Pneumococcal polysaccharide (PPSV23) vaccine. One dose is recommended after age 20. Talk to your health care provider about which screenings and vaccines you need and how often you need them. This information is not intended to replace advice given to you by your health care provider. Make sure you discuss any questions you have with your health care provider. Document Released: 01/16/2015 Document Revised: 09/09/2015 Document Reviewed: 10/21/2014 Elsevier Interactive Patient Education  2017 [place] Prevention in the Home Falls can cause injuries. They can happen to people of all ages. There are many things you can do to make your home safe and to help prevent falls. What can I do on the outside of my home?  Regularly fix the edges of walkways and driveways and fix any cracks.  Remove anything that might make you trip as you walk through a door, such as a raised step or threshold.  Trim any bushes or trees on the path to your home.  Use bright outdoor lighting.  Clear any walking paths of anything that might make someone trip, such as rocks or tools.  Regularly check to see if handrails are loose or broken. Make sure that both sides of any steps have handrails.  Any raised decks and porches should have guardrails on the edges.  Have any leaves, snow, or ice cleared regularly.  Use sand or salt on walking paths during winter.  Clean up any spills in your garage right away. This includes oil or grease spills. What can I do in the bathroom?  Use night lights.  Install grab bars by the toilet and in the tub and shower. Do not use towel bars as grab bars.  Use non-skid mats or decals in the tub or shower.  If you need to sit down in the shower, use a plastic, non-slip  stool.  Keep the floor dry. Clean up any water that spills on the floor as soon as it happens.  Remove soap buildup in the tub or shower regularly.  Attach bath mats securely with double-sided non-slip rug tape.  Do not have throw rugs and other things on the floor that can make you trip. What can I do in the bedroom?  Use night lights.  Make sure that you have a light by your bed that is easy to reach.  Do not use any sheets or blankets that are too big for your bed. They should not hang down onto the floor.  Have a firm chair that has side arms. You can use this for support while you get dressed.  Do not have throw rugs and other things on the floor that can make you trip. What can I do in the kitchen?  Clean up any spills right away.  Avoid walking on wet floors.  Keep items that you use a lot in easy-to-reach places.  If you need to reach something above you, use a strong step stool that has a grab bar.  Keep electrical cords out of the way.  Do not use floor  polish or wax that makes floors slippery. If you must use wax, use non-skid floor wax.  Do not have throw rugs and other things on the floor that can make you trip. What can I do with my stairs?  Do not leave any items on the stairs.  Make sure that there are handrails on both sides of the stairs and use them. Fix handrails that are broken or loose. Make sure that handrails are as long as the stairways.  Check any carpeting to make sure that it is firmly attached to the stairs. Fix any carpet that is loose or worn.  Avoid having throw rugs at the top or bottom of the stairs. If you do have throw rugs, attach them to the floor with carpet tape.  Make sure that you have a light switch at the top of the stairs and the bottom of the stairs. If you do not have them, ask someone to add them for you. What else can I do to help prevent falls?  Wear shoes that:  Do not have high heels.  Have rubber bottoms.  Are  comfortable and fit you well.  Are closed at the toe. Do not wear sandals.  If you use a stepladder:  Make sure that it is fully opened. Do not climb a closed stepladder.  Make sure that both sides of the stepladder are locked into place.  Ask someone to hold it for you, if possible.  Clearly mark and make sure that you can see:  Any grab bars or handrails.  First and last steps.  Where the edge of each step is.  Use tools that help you move around (mobility aids) if they are needed. These include:  Canes.  Walkers.  Scooters.  Crutches.  Turn on the lights when you go into a dark area. Replace any light bulbs as soon as they burn out.  Set up your furniture so you have a clear path. Avoid moving your furniture around.  If any of your floors are uneven, fix them.  If there are any pets around you, be aware of where they are.  Review your medicines with your doctor. Some medicines can make you feel dizzy. This can increase your chance of falling. Ask your doctor what other things that you can do to help prevent falls. This information is not intended to replace advice given to you by your health care provider. Make sure you discuss any questions you have with your health care provider. Document Released: 10/16/2008 Document Revised: 05/28/2015 Document Reviewed: 01/24/2014 Elsevier Interactive Patient Education  2017 [employer].

## 2020-04-16 NOTE — Progress Notes (Signed)
Subjective:   Beverly [name] is a 86 y.o. female who presents for Medicare Annual (Subsequent) preventive examination.  Review of Systems     Cardiac Risk Factors include: advanced age (>53men, >49 women);diabetes mellitus;dyslipidemia;hypertension     Objective:    Today's Vitals   04/16/20 0920  BP: 130/68  Pulse: 96  Resp: 16  Temp: 98 F (36.7 C)  TempSrc: Oral  SpO2: 99%  Weight: 177 lb 1.6 oz (80.3 kg)  Height: 5\' 5"  (1.651 m)   Body mass index is 29.47 kg/m.  Advanced Directives 04/16/2020 04/16/2019 09/24/2018 07/24/2018 03/28/2017 06/14/2016 04/11/2016  Does Patient Have a Medical Advance Directive? Yes Yes Yes No Yes Yes Yes  Type of Paramedic of [place];Living will [place];Living will [place];Living will - [place];Living will [place];Living will Living will;Healthcare Power of Attorney  Does patient want to make changes to medical advance directive? Yes (MAU/Ambulatory/Procedural Areas - Information given) - - - Yes (MAU/Ambulatory/Procedural Areas - Information given) - -  Copy of [place] in Chart? No - copy requested No - copy requested No - copy requested - No - copy requested Yes Yes  Would patient like information on creating a medical advance directive? - - - No - Patient declined - - -    Current Medications (verified) Outpatient Encounter Medications as of 04/16/2020  Medication Sig  . alendronate (FOSAMAX) 35 MG tablet TAKE 1 TABLET BY MOUTH WEEKLY WITH A FULL GLASS OF WATER ON AN EMPTY STOMACH.  Marland Kitchen atorvastatin (LIPITOR) 40 MG tablet Take 1 tablet (40 mg total) by mouth daily.  . Cholecalciferol (VITAMIN D) 2000 units CAPS Take 1 capsule (2,000 Units total) by mouth daily.  . ferrous sulfate 325 (65 FE) MG tablet Take 325 mg by mouth 2 (two) times daily with a meal.   . furosemide (LASIX) 40 MG tablet Take 1 tablet (40 mg total) by  mouth daily as needed.  . Glucosamine Sulfate 500 MG TABS Take 1 tablet by mouth once a week.   . loratadine (CLARITIN) 10 MG tablet Take 1 tablet (10 mg total) by mouth daily.  Marland Kitchen losartan (COZAAR) 50 MG tablet Take 1 tablet (50 mg total) by mouth daily.  . metFORMIN (GLUCOPHAGE) 1000 MG tablet Take 1 tablet (1,000 mg total) by mouth 2 (two) times daily with a meal.  . montelukast (SINGULAIR) 10 MG tablet TAKE 1 TABLET BY MOUTH EVERYDAY AT BEDTIME  . Multiple Vitamin (MULTI-VITAMINS) TABS Take by mouth.  Marland Kitchen omeprazole (PRILOSEC) 20 MG capsule TAKE 1 CAPSULE (20 MG TOTAL) BY MOUTH 2 (TWO) TIMES DAILY BEFORE A MEAL.  Marland Kitchen Potassium Chloride ER 20 MEQ TBCR Take 10 mEq by mouth daily as needed. With lasix  . repaglinide (PRANDIN) 2 MG tablet Take 1 tablet (2 mg total) by mouth 2 (two) times daily before a meal.  . vitamin B-12 (CYANOCOBALAMIN) 1000 MCG tablet Take 1,000 mcg by mouth 3 (three) times a week.  . vitamin C (ASCORBIC ACID) 500 MG tablet Take 500 mg by mouth daily.  . vitamin e 100 UNT/0.25ML OIL oil Take by mouth daily.  . Zinc Acetate 50 MG CAPS Take by mouth daily.    No facility-administered encounter medications on file as of 04/16/2020.    Allergies (verified) Lisinopril   History: Past Medical History:  Diagnosis Date  . Allergy   . Arthritis   . Diabetes mellitus without complication ([place])   .  Hyperlipidemia   . Hypertension    Past Surgical History:  Procedure Laterality Date  . ABDOMINAL HYSTERECTOMY    . BUNIONECTOMY    . COLONOSCOPY    . COLONOSCOPY WITH PROPOFOL N/A 09/24/2018   Procedure: COLONOSCOPY WITH PROPOFOL;  Surgeon: Lin Landsman, MD;  Location[facility] ENDOSCOPY;  Service: Gastroenterology;  Laterality: N/A;  . ESOPHAGOGASTRODUODENOSCOPY (EGD) WITH PROPOFOL N/A 09/24/2018   Procedure: ESOPHAGOGASTRODUODENOSCOPY (EGD) WITH PROPOFOL;  Surgeon: Lin Landsman, MD;  Location: [facility] ENDOSCOPY;  Service: Gastroenterology;  Laterality: N/A;  . GIVENS  CAPSULE STUDY N/A 10/11/2018   Procedure: GIVENS CAPSULE STUDY;  Surgeon: Lin Landsman, MD;  Location: [facility] ENDOSCOPY;  Service: Gastroenterology;  Laterality: N/A;  . HERNIA REPAIR     Family History  Problem Relation Age of Onset  . Hypertension Mother   . Healthy Father   . Healthy Daughter   . Raynaud syndrome Daughter   . Hypertension Daughter   . Stroke Sister    Social History   Socioeconomic History  . Marital status: Widowed    Spouse name: [name]  . Number of children: 2  . Years of education: Not on file  . Highest education level: Bachelor's degree (e.g., BA, AB, BS)  Occupational History  . Occupation: Retired  Tobacco Use  . Smoking status: Never Smoker  . Smokeless tobacco: Never Used  . Tobacco comment: smoking cessation materials not required  Vaping Use  . Vaping Use: Never used  Substance and Sexual Activity  . Alcohol use: No    Alcohol/week: 0.0 standard drinks  . Drug use: No  . Sexual activity: Not Currently  Other Topics Concern  . Not on file  Social History Narrative   Pt lives alone.    Social Determinants of Health   Financial Resource Strain: Low Risk   . Difficulty of Paying Living Expenses: Not hard at all  Food Insecurity: No Food Insecurity  . Worried About Charity fundraiser in the Last Year: Never true  . Ran Out of Food in the Last Year: Never true  Transportation Needs: No Transportation Needs  . Lack of Transportation (Medical): No  . Lack of Transportation (Non-Medical): No  Physical Activity: Sufficiently Active  . Days of Exercise per Week: 3 days  . Minutes of Exercise per Session: 50 min  Stress: No Stress Concern Present  . Feeling of Stress : Not at all  Social Connections: Moderately Isolated  . Frequency of Communication with Friends and Family: More than three times a week  . Frequency of Social Gatherings with Friends and Family: Twice a week  . Attends Religious Services: More than 4 times per year  .  Active Member of Clubs or Organizations: No  . Attends Archivist Meetings: Never  . Marital Status: Widowed    Tobacco Counseling Counseling given: Not Answered Comment: smoking cessation materials not required   Clinical Intake:  Pre-visit preparation completed: Yes  Pain : No/denies pain     BMI - recorded: 29.47 Nutritional Status: BMI 25 -29 Overweight Nutritional Risks: None Diabetes: Yes CBG done?: No Did pt. bring in CBG monitor from home?: No  How often do you need to have someone help you when you read instructions, pamphlets, or other written materials from your doctor or pharmacy?: 1 - Never  Nutrition Risk Assessment:  Has the patient had any N/V/D within the last 2 months?  No  Does the patient have any non-healing wounds?  No  Has the  patient had any unintentional weight loss or weight gain?  No   Diabetes:  Is the patient diabetic?  Yes  If diabetic, was a CBG obtained today?  No  Did the patient bring in their glucometer from home?  No  How often do you monitor your CBG's? Several times per month. .   Financial Strains and Diabetes Management:  Are you having any financial strains with the device, your supplies or your medication? No .  Does the patient want to be seen by Chronic Care Management for management of their diabetes?  No  Would the patient like to be referred to a Nutritionist or for Diabetic Management?  No   Diabetic Exams:  Diabetic Eye Exam: Completed 01/02/19. Overdue for diabetic eye exam. Pt has been advised about the importance in completing this exam. Scheduled for May 2022 per patient.   Diabetic Foot Exam: Completed 11/12/19.    Interpreter Needed?: No  Information entered by :: [name] LPN   Activities of Daily Living In your present state of health, do you have any difficulty performing the following activities: 04/16/2020 03/10/2020  Hearing? N N  Comment declines hearing aids -  Vision? N N   Difficulty concentrating or making decisions? N N  Walking or climbing stairs? N N  Dressing or bathing? N N  Doing errands, shopping? N N  Preparing Food and eating ? N -  Using the Toilet? N -  In the past six months, have you accidently leaked urine? N -  Do you have problems with loss of bowel control? N -  Managing your Medications? N -  Managing your Finances? N -  Housekeeping or managing your Housekeeping? N -  Some recent data might be hidden    Patient Care Team: Steele Sizer, MD as PCP - General (Family Medicine) Sharlotte Alamo, DPM as Consulting Physician (Podiatry)  Indicate any recent Medical Services you may have received from other than [facility] providers in the past year (date may be approximate).     Assessment:   This is a routine wellness examination for [facility]  Hearing/Vision screen  Hearing Screening   125Hz  250Hz  500Hz  1000Hz  2000Hz  3000Hz  4000Hz  6000Hz  8000Hz   Right ear:           Left ear:           Comments: Pt denies hearing difficulty   Vision Screening Comments: Annual vision screenings done at [facility] Pace  Dietary issues and exercise activities discussed: Current Exercise Habits: Home exercise routine, Type of exercise: treadmill;Other - see comments (nustep machine), Time (Minutes): 50, Frequency (Times/Week): 3, Weekly Exercise (Minutes/Week): 150, Intensity: Moderate, Exercise limited by: None identified  Goals    . DIET - INCREASE WATER INTAKE     Recommend to drink at least 6-8 8oz glasses of water per day.    . Eat more vegetables     Recommend eating at least 2 servings of vegetables a day.      Depression Screen PHQ 2/9 Scores 04/16/2020 03/10/2020 11/12/2019 09/06/2019 07/09/2019 04/16/2019 03/06/2019  PHQ - 2 Score 0 0 0 0 0 0 1  PHQ- 9 Score - - - - 0 - 1    Fall Risk Fall Risk  04/16/2020 03/10/2020 11/12/2019 09/06/2019 07/09/2019  Falls in the past year? 0 0 0 0 0  Number falls in past yr: 0 0 0 0 1  Injury with Fall? 0 0  0 0 0  Comment - - - - -  Risk for fall due to : No Fall Risks - - - -  Risk for fall due to: Comment - - - - -  Follow up Falls prevention discussed - - Falls evaluation completed -    FALL RISK PREVENTION PERTAINING TO THE HOME:  Any stairs in or around the home? Yes  If so, are there any without handrails? No  Home free of loose throw rugs in walkways, pet beds, electrical cords, etc? Yes  Adequate lighting in your home to reduce risk of falls? Yes   ASSISTIVE DEVICES UTILIZED TO PREVENT FALLS:  Life alert? Yes  Use of a cane, walker or w/c? No  Grab bars in the bathroom? Yes  Shower chair or bench in shower? Yes  Elevated toilet seat or a handicapped toilet? Yes   TIMED UP AND GO:  Was the test performed? Yes .  Length of time to ambulate 10 feet: 6 sec.   Gait steady and fast without use of assistive device  Cognitive Function:     6CIT Screen 04/16/2020 04/16/2019 03/28/2017 04/11/2016  What Year? 0 points 0 points 0 points 0 points  What month? 0 points 0 points 0 points 0 points  What time? 0 points 0 points 0 points 0 points  Count back from 20 0 points 0 points 0 points 0 points  Months in reverse 0 points 0 points 0 points 0 points  Repeat phrase 0 points 0 points 0 points 0 points  Total Score 0 0 0 0    Immunizations Immunization History  Administered Date(s) Administered  . Fluad Quad(high Dose 65+) 09/05/2018, 09/06/2019  . Influenza Split 01/27/2003, 10/16/2006, 10/25/2007, 09/03/2009  . Influenza, High Dose Seasonal PF 10/06/2015, 09/25/2017  . Influenza, Seasonal, Injecte, Preservative Fre 09/28/2010, 10/03/2011  . Influenza,inj,Quad PF,6+ Mos 09/17/2013, 10/14/2016  . Influenza-Unspecified 09/17/2013  . PFIZER Comirnaty(Gray Top)Covid-19 Tri-Sucrose Vaccine 02/18/2020  . PFIZER(Purple Top)SARS-COV-2 Vaccination 01/24/2019, 02/14/2019  . Pneumococcal Conjugate-13 09/17/2013  . Pneumococcal Polysaccharide-23 01/03/2001, 11/01/2012  . Tdap 05/25/2010,  07/24/2018  . Zoster 05/26/2010  . Zoster Recombinat (Shingrix) 10/23/2019, 12/20/2019    TDAP status: Up to date  Flu Vaccine status: Up to date  Pneumococcal vaccine status: Up to date  Covid-19 vaccine status: Completed vaccines  Qualifies for Shingles Vaccine? Yes   Zostavax completed Yes   Shingrix Completed?: Yes  Screening Tests Health Maintenance  Topic Date Due  . OPHTHALMOLOGY EXAM  01/02/2020  . INFLUENZA VACCINE  08/03/2020  . HEMOGLOBIN A1C  09/10/2020  . FOOT EXAM  11/11/2020  . TETANUS/TDAP  07/23/2028  . DEXA SCAN  Completed  . COVID-19 Vaccine  Completed  . PNA vac Low Risk Adult  Completed  . HPV VACCINES  Aged Out    Health Maintenance  Health Maintenance Due  Topic Date Due  . OPHTHALMOLOGY EXAM  01/02/2020    Colorectal cancer screening: No longer required.   Mammogram status: No longer required due to age.  Bone density screening: no longer required due to age  Lung Cancer Screening: (Low Dose CT Chest recommended if Age 84-80 years, 30 pack-year currently smoking OR have quit w/in 15years.) does not qualify.   Additional Screening:  Hepatitis C Screening: does not qualify.   Vision Screening: Recommended annual ophthalmology exams for early detection of glaucoma and other disorders of the eye. Is the patient up to date with their annual eye exam?  No  Who is the provider or what is the name of the office in which the  patient attends annual eye exams? Dr. Edison Pace [facility]  Dental Screening: Recommended annual dental exams for proper oral hygiene  Community Resource Referral / Chronic Care Management: CRR required this visit?  No   CCM required this visit?  No      Plan:     I have personally reviewed and noted the following in the patient's chart:   . Medical and social history . Use of alcohol, tobacco or illicit drugs  . Current medications and supplements . Functional ability and status . Nutritional  status . Physical activity . Advanced directives . List of other physicians . Hospitalizations, surgeries, and ER visits in previous 12 months . Vitals . Screenings to include cognitive, depression, and falls . Referrals and appointments  In addition, I have reviewed and discussed with patient certain preventive protocols, quality metrics, and best practice recommendations. A written personalized care plan for preventive services as well as general preventive health recommendations were provided to patient.     [name], LPN   [id number]   Nurse Notes: none

## 2020-05-21 LAB — HM DIABETES EYE EXAM

## 2020-05-29 ENCOUNTER — Other Ambulatory Visit: Payer: Self-pay | Admitting: Gastroenterology

## 2020-06-02 ENCOUNTER — Other Ambulatory Visit: Payer: Self-pay | Admitting: Gastroenterology

## 2020-06-11 ENCOUNTER — Other Ambulatory Visit: Payer: Self-pay | Admitting: Family Medicine

## 2020-06-11 DIAGNOSIS — J3089 Other allergic rhinitis: Secondary | ICD-10-CM

## 2020-06-11 NOTE — Telephone Encounter (Signed)
Requested Prescriptions  Pending Prescriptions Disp Refills  . montelukast (SINGULAIR) 10 MG tablet [Pharmacy Med Name: MONTELUKAST SOD 10 MG TABLET] 90 tablet 0    Sig: TAKE 1 TABLET BY MOUTH EVERYDAY AT BEDTIME     Pulmonology:  Leukotriene Inhibitors Passed - 06/11/2020  1:46 AM      Passed - Valid encounter within last 12 months    Recent Outpatient Visits          3 months ago Dyslipidemia associated with type 2 diabetes mellitus [facility])   [facility], Drue Stager, MD   7 months ago Dyslipidemia associated with type 2 diabetes mellitus [facility])   [facility] Steele Sizer, MD   9 months ago Need for influenza vaccination   [facility] Lebron Conners D, MD   11 months ago 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

## 2020-07-01 ENCOUNTER — Other Ambulatory Visit: Payer: Self-pay | Admitting: Family Medicine

## 2020-07-01 DIAGNOSIS — E1169 Type 2 diabetes mellitus with other specified complication: Secondary | ICD-10-CM

## 2020-07-01 DIAGNOSIS — I10 Essential (primary) hypertension: Secondary | ICD-10-CM

## 2020-07-01 DIAGNOSIS — E785 Hyperlipidemia, unspecified: Secondary | ICD-10-CM

## 2020-07-13 NOTE — Progress Notes (Signed)
Name: Beverly [name]   MRN: [id number]    DOB: 05/15/1934   Date:07/14/2020       Progress Note  Subjective  Chief Complaint  Follow Up  HPI  DMII: she has been taking  Metformin to 2000 mg daily,  taking Prandin two  times a day before her meals, today A1C is down from 6.8 % to 6.3 % and today is 6.4 %   Fasting low 95-105   and  post- prandially 150-155. Eye exam is up to date., she has proliferative diabetic retinopathy and also macular edema of right eye, she is going back for a short interval follow up.  She sees podiatrist for her callus every 9 weeks. She denies polyphagia or polydipsia but she has nocturia - once or twice per night  - stable. She is taking B12 sublingual three days a week. She denies side effects of medication , on Prandin and Metformin    Syncopal episode: seen by cardiologist, Dr. Caryl Comes, echo showed normal EF. Possibly from change in bp medication because of shortage. She has not had any more episodes Had sub-dural hematoma, she had a repeat MRI done 08/2019 and showed a very mild increase in size of meningioma.  She states she will go back to see him in 2023   HTN: BP is towards low end of normal, at home her bp is 120-130's/60-70's  she denies dizziness, chest pain or palpitation. She is compliant with medication , she is not having any dizziness and wants to monitor for now, explained we can go down to a lower dose of losartan    Hyperlipidemia: taking Atorvastatin, no myalgias. We will recheck levels yearly   Headache: she has noticed intermittent headache that starts on right side of head that radiates frontally, not associated with nausea, vomiting, no phonophobia or photophobia, no neuro deficit, she states ice pack and tylenol improves symptoms. She is doing well at this time, no problems recently    AR: well controlled, taking medication prn and is doing well at this time, no rhinorrhea or nasal congestion at this time. Unchanged    OA:  it started after a  fall 35 plus years ago secondary to a fall, initially right knee, but now seems to be worse on the left side.  Pain is sharp on the left knee, and feels weak on left leg.She states pain has been better controlled lately    Unintentional weight loss/Protein malnutrition : she had lost 18 lbs from 2018 to 2019 .  She states she was under a lot of stress since Summer of 2018 worried about grand-daughter  also her daughter was diagnosed with uterine cancer. Since she lost weight, she got motivated to eat healthier, she feel Summer 2020 and broke her dentures during a syncopal episode.  Weight is stable now, still has anemia , taking iron pills three times a week. She still refuses to get hemoccult stools done   Breast cyst: she states long history of a black head on left breast, but a couple of weeks ago it got red and inflamed, she took some activated charcoal and it decrease in size, no pain and redness is resolving , no nipple discharge or lumps   Patient Active Problem List   Diagnosis Date Noted   Iron deficiency anemia    Subdural hematoma ([place])    Syncope and collapse 07/24/2018   Type 2 diabetes mellitus with stable proliferative retinopathy of both eyes, without long-term current use of insulin (  [place]) 02/27/2018   Type 2 diabetes mellitus with pressure callus ([place]) 10/24/2017   Diabetic retinopathy ([place]) 11/12/2015   Obesity (BMI 30.0-34.9) 06/02/2015   Osteoarthritis 06/02/2015   Osteopenia 06/02/2015   Narrowing of intervertebral disc space 06/02/2015   Dyslipidemia associated with type 2 diabetes mellitus ([place]) 06/02/2015   Perennial allergic rhinitis 02/28/2007   Benign essential HTN 06/26/2006   Pure hypercholesterolemia 06/26/2006    Past Surgical History:  Procedure Laterality Date   ABDOMINAL HYSTERECTOMY     BUNIONECTOMY     COLONOSCOPY     COLONOSCOPY WITH PROPOFOL N/A 09/24/2018   Procedure: COLONOSCOPY WITH PROPOFOL;  Surgeon: Lin Landsman, MD;  Location: [place];  Service: Gastroenterology;  Laterality: N/A;   ESOPHAGOGASTRODUODENOSCOPY (EGD) WITH PROPOFOL N/A 09/24/2018   Procedure: ESOPHAGOGASTRODUODENOSCOPY (EGD) WITH PROPOFOL;  Surgeon: Lin Landsman, MD;  Location: [place];  Service: Gastroenterology;  Laterality: N/A;   GIVENS CAPSULE STUDY N/A 10/11/2018   Procedure: GIVENS CAPSULE STUDY;  Surgeon: Lin Landsman, MD;  [facility];  Service: Gastroenterology;  Laterality: N/A;   HERNIA REPAIR      Family History  Problem Relation Age of Onset   Hypertension Mother    Healthy Father    Healthy Daughter    Raynaud syndrome Daughter    Hypertension Daughter    Stroke Sister     Social History   Tobacco Use   Smoking status: Never   Smokeless tobacco: Never   Tobacco comments:    smoking cessation materials not required  Substance Use Topics   Alcohol use: No    Alcohol/week: 0.0 standard drinks     Current Outpatient Medications:    alendronate (FOSAMAX) 35 MG tablet, TAKE 1 TABLET BY MOUTH WEEKLY WITH A FULL GLASS OF WATER ON AN EMPTY STOMACH., Disp: 12 tablet, Rfl: 1   atorvastatin (LIPITOR) 40 MG tablet, TAKE 1 TABLET BY MOUTH EVERY DAY, Disp: 90 tablet, Rfl: 1   Cholecalciferol (VITAMIN D) 2000 units CAPS, Take 1 capsule (2,000 Units total) by mouth daily., Disp: 30 capsule, Rfl: 0   ferrous sulfate 325 (65 FE) MG tablet, Take 325 mg by mouth 2 (two) times daily with a meal. , Disp: , Rfl:    furosemide (LASIX) 40 MG tablet, Take 1 tablet (40 mg total) by mouth daily as needed., Disp: 90 tablet, Rfl: 0   Glucosamine Sulfate 500 MG TABS, Take 1 tablet by mouth once a week. , Disp: , Rfl:    loratadine (CLARITIN) 10 MG tablet, Take 1 tablet (10 mg total) by mouth daily., Disp: 90 tablet, Rfl: 1   losartan (COZAAR) 50 MG tablet, TAKE 1 TABLET BY MOUTH EVERY DAY, Disp: 90 tablet, Rfl: 1   metFORMIN (GLUCOPHAGE) 1000 MG tablet, Take 1 tablet (1,000 mg total) by mouth 2 (two) times daily with a  meal., Disp: 180 tablet, Rfl: 1   montelukast (SINGULAIR) 10 MG tablet, TAKE 1 TABLET BY MOUTH EVERYDAY AT BEDTIME, Disp: 90 tablet, Rfl: 0   Multiple Vitamin (MULTI-VITAMINS) TABS, Take by mouth., Disp: , Rfl:    omeprazole (PRILOSEC) 20 MG capsule, TAKE 1 CAPSULE (20 MG TOTAL) BY MOUTH 2 (TWO) TIMES DAILY BEFORE A MEAL., Disp: 180 capsule, Rfl: 0   Potassium Chloride ER 20 MEQ TBCR, Take 10 mEq by mouth daily as needed. With lasix, Disp: , Rfl:    repaglinide (PRANDIN) 2 MG tablet, Take 1 tablet (2 mg total) by mouth 2 (two) times daily before a meal., Disp: 180  tablet, Rfl: 1   vitamin B-12 (CYANOCOBALAMIN) 1000 MCG tablet, Take 1,000 mcg by mouth 3 (three) times a week., Disp: , Rfl:    vitamin C (ASCORBIC ACID) 500 MG tablet, Take 500 mg by mouth daily., Disp: , Rfl:    vitamin e 100 UNT/0.25ML OIL oil, Take by mouth daily., Disp: , Rfl:    Zinc Acetate 50 MG CAPS, Take by mouth daily. , Disp: , Rfl:   Allergies  Allergen Reactions   Lisinopril     I personally reviewed active problem list, medication list, allergies, family history, social history, health maintenance with the patient/caregiver today.   ROS  Constitutional: Negative for fever or weight change.  Respiratory: Negative for cough and shortness of breath.   Cardiovascular: Negative for chest pain or palpitations.  Gastrointestinal: Negative for abdominal pain, no bowel changes.  Musculoskeletal: Negative for gait problem or joint swelling.  Skin: Negative for rash.  Neurological: Negative for dizziness or headache.  No other specific complaints in a complete review of systems (except as listed in HPI above).    Objective  Vitals:   07/14/20 1115  BP: 120/66  Pulse: 89  Resp: 16  Temp: 97.8 F (36.6 C)  SpO2: 99%  Weight: 176 lb (79.8 kg)  Height: 5\' 5"  (1.651 m)    Body mass index is 29.29 kg/m.  Physical Exam  Constitutional: Patient appears well-developed and well-nourished.  No distress.  HEENT:  head atraumatic, normocephalic, pupils equal and reactive to light, neck supple Cardiovascular: Normal rate, regular rhythm and normal heart sounds.  No murmur heard. 1 plus BLE edema. Breast: 5 mm raised lesion, looks like an infected black head on her left inner upper breast, no nodules underneath  Pulmonary/Chest: Effort normal and breath sounds normal. No respiratory distress. Abdominal: Soft.  There is no tenderness. Psychiatric: Patient has a normal mood and affect. behavior is normal. Judgment and thought content normal.   Recent Results (from the past 2160 hour(s))  HM DIABETES EYE EXAM     Status: Abnormal   Collection Time: 05/21/20 12:00 AM  Result Value Ref Range   HM Diabetic Eye Exam Retinopathy (A) No Retinopathy    Comment: proliferative/macular edema of right eye  POCT HgB A1C     Status: Abnormal   Collection Time: 07/14/20 11:18 AM  Result Value Ref Range   Hemoglobin A1C 6.4 (A) 4.0 - 5.6 %   HbA1c POC (<> result, manual entry)     HbA1c, POC (prediabetic range)     HbA1c, POC (controlled diabetic range)       PHQ2/9: Depression screen [facility] 2/9 07/14/2020 04/16/2020 03/10/2020 11/12/2019 09/06/2019  Decreased Interest 0 0 0 0 0  Down, Depressed, Hopeless 0 0 0 0 0  PHQ - 2 Score 0 0 0 0 0  Altered sleeping - - - - -  Tired, decreased energy - - - - -  Change in appetite - - - - -  Feeling bad or failure about yourself  - - - - -  Trouble concentrating - - - - -  Moving slowly or fidgety/restless - - - - -  Suicidal thoughts - - - - -  PHQ-9 Score - - - - -  Difficult doing work/chores - - - - -  Some recent data might be hidden    phq 9 is negative  Fall Risk: Fall Risk  07/14/2020 04/16/2020 03/10/2020 11/12/2019 09/06/2019  Falls in the past year? 0 0 0 0 0  Number falls in past yr: 0 0 0 0 0  Injury with Fall? 0 0 0 0 0  Comment - - - - -  Risk for fall due to : - No Fall Risks - - -  Risk for fall due to: Comment - - - - -  Follow up - Falls prevention  discussed - - Falls evaluation completed     Functional Status Survey: Is the patient deaf or have difficulty hearing?: No Does the patient have difficulty seeing, even when wearing glasses/contacts?: No Does the patient have difficulty concentrating, remembering, or making decisions?: No Does the patient have difficulty walking or climbing stairs?: No Does the patient have difficulty dressing or bathing?: No Does the patient have difficulty doing errands alone such as visiting a doctor's office or shopping?: No    Assessment & Plan  1. Dyslipidemia associated with type 2 diabetes mellitus (HCC)  - POCT HgB A1C  2. Dyslipidemia   3. B12 deficiency   4. Essential (primary) hypertension   5. Primary osteoarthritis of right knee   6. History of subdural hematoma   7. Type 2 diabetes mellitus with stable proliferative retinopathy of both eyes, without long-term current use of insulin (HCC)   8. Type 2 diabetes mellitus with pressure callus (HCC)   9. Other iron deficiency anemia   10. Cyst of left breast  Looks like she had an infected cyst, that is resolving, discussed referral to surgeon but she would like to hold off at this time, since recurrent and seems to be going down in size and redness is resolving

## 2020-07-14 ENCOUNTER — Other Ambulatory Visit: Payer: Self-pay

## 2020-07-14 ENCOUNTER — Encounter: Payer: Self-pay | Admitting: Family Medicine

## 2020-07-14 ENCOUNTER — Ambulatory Visit: Payer: Medicare Other | Admitting: Family Medicine

## 2020-07-14 VITALS — BP 120/66 | HR 89 | Temp 97.8°F | Resp 16 | Ht 65.0 in | Wt 176.0 lb

## 2020-07-14 DIAGNOSIS — E785 Hyperlipidemia, unspecified: Secondary | ICD-10-CM | POA: Diagnosis not present

## 2020-07-14 DIAGNOSIS — L84 Corns and callosities: Secondary | ICD-10-CM

## 2020-07-14 DIAGNOSIS — I10 Essential (primary) hypertension: Secondary | ICD-10-CM

## 2020-07-14 DIAGNOSIS — D508 Other iron deficiency anemias: Secondary | ICD-10-CM

## 2020-07-14 DIAGNOSIS — M1711 Unilateral primary osteoarthritis, right knee: Secondary | ICD-10-CM

## 2020-07-14 DIAGNOSIS — E538 Deficiency of other specified B group vitamins: Secondary | ICD-10-CM

## 2020-07-14 DIAGNOSIS — E1169 Type 2 diabetes mellitus with other specified complication: Secondary | ICD-10-CM

## 2020-07-14 DIAGNOSIS — Z8679 Personal history of other diseases of the circulatory system: Secondary | ICD-10-CM

## 2020-07-14 DIAGNOSIS — N6002 Solitary cyst of left breast: Secondary | ICD-10-CM

## 2020-07-14 DIAGNOSIS — E11628 Type 2 diabetes mellitus with other skin complications: Secondary | ICD-10-CM

## 2020-07-14 DIAGNOSIS — E113553 Type 2 diabetes mellitus with stable proliferative diabetic retinopathy, bilateral: Secondary | ICD-10-CM

## 2020-07-14 LAB — POCT GLYCOSYLATED HEMOGLOBIN (HGB A1C): Hemoglobin A1C: 6.4 % — AB (ref 4.0–5.6)

## 2020-07-28 ENCOUNTER — Telehealth: Payer: Self-pay | Admitting: Family Medicine

## 2020-07-28 NOTE — Telephone Encounter (Signed)
"  Screening quantiflow that showed mild bilateral PAD."  Best contact: [place] from [employer]

## 2020-07-28 NOTE — Telephone Encounter (Signed)
Cardell Peach from [employer] called, left VM to return the call to the office concerning the screening report done on the patient.

## 2020-07-28 NOTE — Telephone Encounter (Signed)
Return call : Kennis Carina Calling to report her screening assessment of patient did show very mild bilateral PAD- she is driving now- but reports numbers: 0.9 and 0.87 -no symptoms. Will forward message to PCP for follow up.

## 2020-08-14 ENCOUNTER — Other Ambulatory Visit: Payer: Self-pay | Admitting: Family Medicine

## 2020-08-14 DIAGNOSIS — M8580 Other specified disorders of bone density and structure, unspecified site: Secondary | ICD-10-CM

## 2020-08-16 ENCOUNTER — Other Ambulatory Visit: Payer: Self-pay | Admitting: Family Medicine

## 2020-08-16 DIAGNOSIS — E1169 Type 2 diabetes mellitus with other specified complication: Secondary | ICD-10-CM

## 2020-08-16 DIAGNOSIS — E113553 Type 2 diabetes mellitus with stable proliferative diabetic retinopathy, bilateral: Secondary | ICD-10-CM

## 2020-08-16 DIAGNOSIS — E785 Hyperlipidemia, unspecified: Secondary | ICD-10-CM

## 2020-08-16 NOTE — Telephone Encounter (Signed)
Requested Prescriptions  Pending Prescriptions Disp Refills  . metFORMIN (GLUCOPHAGE) 1000 MG tablet [Pharmacy Med Name: METFORMIN HCL 1,000 MG TABLET] 180 tablet 1    Sig: TAKE 1 TABLET (1,000 MG TOTAL) BY MOUTH 2 (TWO) TIMES DAILY WITH A MEAL.     Endocrinology:  Diabetes - Biguanides Passed - 08/16/2020  5:14 PM      Passed - Cr in normal range and within 360 days    Creat  Date Value Ref Range Status  11/12/2019 0.73 0.60 - 0.88 mg/dL Final    Comment:    For patients >2 years of age, the reference limit for Creatinine is approximately 13% higher for people identified as African-American. .    Creatinine, Urine  Date Value Ref Range Status  11/12/2019 104 20 - 275 mg/dL Final         Passed - HBA1C is between 0 and 7.9 and within 180 days    Hemoglobin A1C  Date Value Ref Range Status  07/14/2020 6.4 (A) 4.0 - 5.6 % Final   HbA1c, POC (controlled diabetic range)  Date Value Ref Range Status  03/06/2019 6.4 0.0 - 7.0 % Final         Passed - AA eGFR in normal range and within 360 days    GFR, Est African American  Date Value Ref Range Status  11/12/2019 88 > OR = 60 mL/min/1.64m Final   GFR, Est Non African American  Date Value Ref Range Status  11/12/2019 76 > OR = 60 mL/min/1.735mFinal         Passed - Valid encounter within last 6 months    Recent Outpatient Visits          1 month ago [facility]ys[facility] with type 2 diabetes [facility] ([facility]   5 months ago [facility]ys[facility] with type 2 [facility] ([facility]   9 months ago Dyslipidemia associated with type 2 diabetes mellitus ([facility]  [facility]   11 months ago Need for influenza vaccination   [facility]   1 year ago Type 2 diabetes mellitus with stable proliferative retinopathy of both eyes, without  long-term current use of insulin ([facility]  [facility]      Future Appointments            In 2 months [facility] StagerMD [facility] In 8 months  [facility]

## 2020-08-30 ENCOUNTER — Other Ambulatory Visit: Payer: Self-pay | Admitting: Gastroenterology

## 2020-09-11 ENCOUNTER — Other Ambulatory Visit: Payer: Self-pay | Admitting: Family Medicine

## 2020-09-11 DIAGNOSIS — J3089 Other allergic rhinitis: Secondary | ICD-10-CM

## 2020-11-09 NOTE — Progress Notes (Signed)
Name: Beverly [name]   MRN: [id number]    DOB: [birth year]   Date:11/10/2020       Progress Note  Subjective  Chief Complaint  Follow Up  HPI  DMII: she is now taking Metformin to 1000 mg daily,  taking Prandin two  times a day before her meals, today A1C is down from 6.8 % to 6.3 % 6.4 % and today is 6.2 % she continues to lose weight we will decrease dose of Metformin to 500 mg bid and take Prandin only before largest meal of the day.  Fasting low 95-105  Eye exam is up to date., she has proliferative diabetic retinopathy and also macular edema of right eye.  She sees podiatrist for her callus every 9 weeks. She denies polyphagia or polydipsia , she is able to sleep from 11 pm to 6 am without voiding - stable. She is taking B12 sublingual three days a week.    History of syncopal episode: in 2020 seen by cardiologist, Dr. Caryl Comes, echo showed normal EF. Possibly from change in bp medication because of shortage. She has not had any more episodes Had sub-dural hematoma, she had a repeat MRI done 08/2019 and showed a very mild increase in size of meningioma. She is going back to neurosurgeon next year to re-evaluated size of meningioma   HTN:  BP is at goal, also at goal at home , denies orthostatic changes    Hyperlipidemia: taking Atorvastatin, no myalgias. We will recheck labs today    AR: well controlled, taking medication prn and is doing well at this time, no rhinorrhea or nasal congestion at this time. Unchanged    OA:  it started after a fall 35 plus years ago secondary to a fall, initially right knee, but now seems to be worse on the left side.  Pain is sharp on the left knee, and feels weak on left leg. Discussed PT to strengthen her legs, but happy seeing Chiropractor - Dr. Freddi Che   Unintentional weight loss/Protein malnutrition : she had lost 18 lbs from 2018 to 2019 .  She states she was under a lot of stress since Summer of 2018 worried about grand-daughter  also her daughter was  diagnosed with uterine cancer. Since she lost weight, she got motivated to eat healthier, she feel Summer 2020 and broke her dentures during a syncopal episode.  Weight was stable, but lost 4 more pounds since last visit, discussed trying protein shakes as supplements  Osteopenia: taking alendronate, we will check vitamin D   GERD: no heartburn or indigestion   Patient Active Problem List   Diagnosis Date Noted   Meningioma ([place]) 11/10/2020   Iron deficiency anemia    Subdural hematoma    Syncope and collapse 07/24/2018   Type 2 diabetes mellitus with stable proliferative retinopathy of both eyes, without long-term current use of insulin ([place]) 02/27/2018   Type 2 diabetes mellitus with pressure callus ([place]) 10/24/2017   Diabetic retinopathy ([place]) 11/12/2015   Obesity (BMI 30.0-34.9) 06/02/2015   Osteoarthritis 06/02/2015   Osteopenia 06/02/2015   Narrowing of intervertebral disc space 06/02/2015   Dyslipidemia associated with type 2 diabetes mellitus ([place]) 06/02/2015   Perennial allergic rhinitis 02/28/2007   Benign essential HTN 06/26/2006   Pure hypercholesterolemia 06/26/2006    Past Surgical History:  Procedure Laterality Date   ABDOMINAL HYSTERECTOMY     BUNIONECTOMY     COLONOSCOPY     COLONOSCOPY WITH PROPOFOL N/A 09/24/2018   Procedure: COLONOSCOPY WITH  PROPOFOL;  Surgeon: Lin Landsman, MD;  Location: [facility] ENDOSCOPY;  Service: Gastroenterology;  Laterality: N/A;   ESOPHAGOGASTRODUODENOSCOPY (EGD) WITH PROPOFOL N/A 09/24/2018   Procedure: ESOPHAGOGASTRODUODENOSCOPY (EGD) WITH PROPOFOL;  Surgeon: Lin Landsman, MD;  Location: [place];  Service: Gastroenterology;  Laterality: N/A;   GIVENS CAPSULE STUDY N/A 10/11/2018   Procedure: GIVENS CAPSULE STUDY;  Surgeon: Lin Landsman, MD;  Location: [facility] ENDOSCOPY;  Service: Gastroenterology;  Laterality: N/A;   HERNIA REPAIR      Family History  Problem Relation Age of Onset   Hypertension Mother     Healthy Father    Healthy Daughter    Raynaud syndrome Daughter    Hypertension Daughter    Stroke Sister     Social History   Tobacco Use   Smoking status: Never   Smokeless tobacco: Never   Tobacco comments:    smoking cessation materials not required  Substance Use Topics   Alcohol use: No    Alcohol/week: 0.0 standard drinks     Current Outpatient Medications:    Cholecalciferol (VITAMIN D) 2000 units CAPS, Take 1 capsule (2,000 Units total) by mouth daily., Disp: 30 capsule, Rfl: 0   ferrous sulfate 325 (65 FE) MG tablet, Take 325 mg by mouth 2 (two) times daily with a meal. , Disp: , Rfl:    furosemide (LASIX) 40 MG tablet, Take 1 tablet (40 mg total) by mouth daily as needed., Disp: 90 tablet, Rfl: 0   Glucosamine Sulfate 500 MG TABS, Take 1 tablet by mouth once a week. , Disp: , Rfl:    loratadine (CLARITIN) 10 MG tablet, Take 1 tablet (10 mg total) by mouth daily., Disp: 90 tablet, Rfl: 1   Multiple Vitamin (MULTI-VITAMINS) TABS, Take by mouth., Disp: , Rfl:    omeprazole (PRILOSEC) 20 MG capsule, TAKE 1 CAPSULE (20 MG TOTAL) BY MOUTH 2 (TWO) TIMES DAILY BEFORE A MEAL., Disp: 180 capsule, Rfl: 0   Potassium Chloride ER 20 MEQ TBCR, Take 10 mEq by mouth daily as needed. With lasix, Disp: , Rfl:    vitamin B-12 (CYANOCOBALAMIN) 1000 MCG tablet, Take 1,000 mcg by mouth 3 (three) times a week., Disp: , Rfl:    vitamin C (ASCORBIC ACID) 500 MG tablet, Take 500 mg by mouth daily., Disp: , Rfl:    vitamin e 100 UNT/0.25ML OIL oil, Take by mouth daily., Disp: , Rfl:    Zinc Acetate 50 MG CAPS, Take by mouth daily. , Disp: , Rfl:    alendronate (FOSAMAX) 35 MG tablet, Take with a full glass of water on an empty stomach., Disp: 12 tablet, Rfl: 3   atorvastatin (LIPITOR) 40 MG tablet, Take 1 tablet (40 mg total) by mouth daily., Disp: 90 tablet, Rfl: 1   losartan (COZAAR) 50 MG tablet, Take 1 tablet (50 mg total) by mouth daily., Disp: 90 tablet, Rfl: 1   metFORMIN (GLUCOPHAGE) 500  MG tablet, Take 1 tablet (500 mg total) by mouth 2 (two) times daily with a meal. In place of 1000 mg dose, Disp: 180 tablet, Rfl: 1   montelukast (SINGULAIR) 10 MG tablet, TAKE 1 TABLET BY MOUTH EVERYDAY AT BEDTIME, Disp: 90 tablet, Rfl: 1   repaglinide (PRANDIN) 2 MG tablet, Take 1 tablet (2 mg total) by mouth daily before supper., Disp: 90 tablet, Rfl: 1  Allergies  Allergen Reactions   Lisinopril     I personally reviewed active problem list, medication list, allergies, family history, social history, health maintenance with the patient/caregiver  today.   ROS  Constitutional: Negative for fever , positive for mild  weight change.  Respiratory: Negative for cough and shortness of breath.   Cardiovascular: Negative for chest pain or palpitations.  Gastrointestinal: Negative for abdominal pain, no bowel changes.  Musculoskeletal: Negative for gait problem or joint swelling.  Skin: Negative for rash.  Neurological: Negative for dizziness or headache.  No other specific complaints in a complete review of systems (except as listed in HPI above).   Objective  Vitals:   11/10/20 1057  BP: 126/84  Pulse: 89  Resp: 16  Temp: 98.1 F (36.7 C)  SpO2: 96%  Weight: 172 lb (78 kg)  Height: 5\' 5"  (1.651 m)    Body mass index is 28.62 kg/m.  Physical Exam  Constitutional: Patient appears well-developed and well-nourished. No distress.  HEENT: head atraumatic, normocephalic, pupils equal and reactive to light, neck supple Cardiovascular: Normal rate, regular rhythm and normal heart sounds.  No murmur heard. Trace  BLE edema. Pulmonary/Chest: Effort normal and breath sounds normal. No respiratory distress. Abdominal: Soft.  There is no tenderness. Psychiatric: Patient has a normal mood and affect. behavior is normal. Judgment and thought content normal.   Diabetic Foot Exam: Diabetic Foot Exam - Simple   Simple Foot Form Diabetic Foot exam was performed with the following  findings: Yes   Visual Inspection See comments: Yes Sensation Testing See comments: Yes Pulse Check See comments: Yes Comments Thick toenails, callus formation, distal pulses weaker on both side       PHQ2/9: Depression screen [facility] 2/9 11/10/2020 07/14/2020 04/16/2020 03/10/2020 11/12/2019  Decreased Interest 0 0 0 0 0  Down, Depressed, Hopeless 0 0 0 0 0  PHQ - 2 Score 0 0 0 0 0  Altered sleeping 0 - - - -  Tired, decreased energy 0 - - - -  Change in appetite 0 - - - -  Feeling bad or failure about yourself  0 - - - -  Trouble concentrating 0 - - - -  Moving slowly or fidgety/restless 0 - - - -  Suicidal thoughts 0 - - - -  PHQ-9 Score 0 - - - -  Difficult doing work/chores - - - - -  Some recent data might be hidden    phq 9 is negative   Fall Risk: Fall Risk  11/10/2020 07/14/2020 04/16/2020 03/10/2020 11/12/2019  Falls in the past year? 1 0 0 0 0  Number falls in past yr: 1 0 0 0 0  Injury with Fall? 0 0 0 0 0  Comment - - - - -  Risk for fall due to : Impaired balance/gait - No Fall Risks - -  Risk for fall due to: Comment - - - - -  Follow up Falls prevention discussed - Falls prevention discussed - -      Functional Status Survey: Is the patient deaf or have difficulty hearing?: No Does the patient have difficulty seeing, even when wearing glasses/contacts?: No Does the patient have difficulty concentrating, remembering, or making decisions?: No Does the patient have difficulty walking or climbing stairs?: No Does the patient have difficulty dressing or bathing?: No Does the patient have difficulty doing errands alone such as visiting a doctor's office or shopping?: No    Assessment & Plan   1. Dyslipidemia associated with type 2 diabetes mellitus (HCC)  - POCT HgB A1C - HM Diabetes Foot Exam - atorvastatin (LIPITOR) 40 MG tablet; Take 1 tablet (40 mg total)  by mouth daily.  Dispense: 90 tablet; Refill: 1 - metFORMIN (GLUCOPHAGE) 500 MG tablet; Take 1 tablet  (500 mg total) by mouth 2 (two) times daily with a meal. In place of 1000 mg dose  Dispense: 180 tablet; Refill: 1 - repaglinide (PRANDIN) 2 MG tablet; Take 1 tablet (2 mg total) by mouth daily before supper.  Dispense: 90 tablet; Refill: 1 - Lipid panel - COMPLETE METABOLIC PANEL WITH GFR  2. Type 2 diabetes mellitus with pressure callus (HCC)  - atorvastatin (LIPITOR) 40 MG tablet; Take 1 tablet (40 mg total) by mouth daily.  Dispense: 90 tablet; Refill: 1 - metFORMIN (GLUCOPHAGE) 500 MG tablet; Take 1 tablet (500 mg total) by mouth 2 (two) times daily with a meal. In place of 1000 mg dose  Dispense: 180 tablet; Refill: 1 - repaglinide (PRANDIN) 2 MG tablet; Take 1 tablet (2 mg total) by mouth daily before supper.  Dispense: 90 tablet; Refill: 1 - Microalbumin / creatinine urine ratio  3. Mild protein-calorie malnutrition (HCC)  - TSH  4. Meningioma ([place])  Going to see neurosurgeon next year   5. Benign essential HTN   6. B12 deficiency  - Vitamin B12  7. Essential (primary) hypertension  - losartan (COZAAR) 50 MG tablet; Take 1 tablet (50 mg total) by mouth daily.  Dispense: 90 tablet; Refill: 1  8. Primary osteoarthritis of right knee   9. Dyslipidemia  - atorvastatin (LIPITOR) 40 MG tablet; Take 1 tablet (40 mg total) by mouth daily.  Dispense: 90 tablet; Refill: 1  10. History of subdural hematoma   11. Osteopenia, unspecified location  - alendronate (FOSAMAX) 35 MG tablet; Take with a full glass of water on an empty stomach.  Dispense: 12 tablet; Refill: 3 - VITAMIN D 25 Hydroxy (Vit-D Deficiency, Fractures)  12. Type 2 diabetes mellitus with stable proliferative retinopathy of both eyes, without long-term current use of insulin (HCC)  - metFORMIN (GLUCOPHAGE) 500 MG tablet; Take 1 tablet (500 mg total) by mouth 2 (two) times daily with a meal. In place of 1000 mg dose  Dispense: 180 tablet; Refill: 1 - repaglinide (PRANDIN) 2 MG tablet; Take 1 tablet (2 mg  total) by mouth daily before supper.  Dispense: 90 tablet; Refill: 1  13. Perennial allergic rhinitis  - montelukast (SINGULAIR) 10 MG tablet; TAKE 1 TABLET BY MOUTH EVERYDAY AT BEDTIME  Dispense: 90 tablet; Refill: 1  14. History of iron deficiency anemia  - CBC with Differential/Platelet  15. Weight loss  - TSH

## 2020-11-10 ENCOUNTER — Encounter: Payer: Self-pay | Admitting: Family Medicine

## 2020-11-10 ENCOUNTER — Other Ambulatory Visit: Payer: Self-pay

## 2020-11-10 ENCOUNTER — Ambulatory Visit: Payer: Medicare Other | Admitting: Family Medicine

## 2020-11-10 VITALS — BP 126/84 | HR 89 | Temp 98.1°F | Resp 16 | Ht 65.0 in | Wt 172.0 lb

## 2020-11-10 DIAGNOSIS — E785 Hyperlipidemia, unspecified: Secondary | ICD-10-CM

## 2020-11-10 DIAGNOSIS — M1711 Unilateral primary osteoarthritis, right knee: Secondary | ICD-10-CM

## 2020-11-10 DIAGNOSIS — R634 Abnormal weight loss: Secondary | ICD-10-CM

## 2020-11-10 DIAGNOSIS — D329 Benign neoplasm of meninges, unspecified: Secondary | ICD-10-CM | POA: Diagnosis not present

## 2020-11-10 DIAGNOSIS — E11628 Type 2 diabetes mellitus with other skin complications: Secondary | ICD-10-CM | POA: Diagnosis not present

## 2020-11-10 DIAGNOSIS — J3089 Other allergic rhinitis: Secondary | ICD-10-CM

## 2020-11-10 DIAGNOSIS — Z8679 Personal history of other diseases of the circulatory system: Secondary | ICD-10-CM

## 2020-11-10 DIAGNOSIS — E1169 Type 2 diabetes mellitus with other specified complication: Secondary | ICD-10-CM | POA: Diagnosis not present

## 2020-11-10 DIAGNOSIS — E538 Deficiency of other specified B group vitamins: Secondary | ICD-10-CM

## 2020-11-10 DIAGNOSIS — I10 Essential (primary) hypertension: Secondary | ICD-10-CM

## 2020-11-10 DIAGNOSIS — Z862 Personal history of diseases of the blood and blood-forming organs and certain disorders involving the immune mechanism: Secondary | ICD-10-CM

## 2020-11-10 DIAGNOSIS — M8580 Other specified disorders of bone density and structure, unspecified site: Secondary | ICD-10-CM

## 2020-11-10 DIAGNOSIS — L84 Corns and callosities: Secondary | ICD-10-CM

## 2020-11-10 DIAGNOSIS — E441 Mild protein-calorie malnutrition: Secondary | ICD-10-CM | POA: Diagnosis not present

## 2020-11-10 DIAGNOSIS — E113553 Type 2 diabetes mellitus with stable proliferative diabetic retinopathy, bilateral: Secondary | ICD-10-CM

## 2020-11-10 LAB — POCT GLYCOSYLATED HEMOGLOBIN (HGB A1C): Hemoglobin A1C: 6.2 % — AB (ref 4.0–5.6)

## 2020-11-10 MED ORDER — LOSARTAN POTASSIUM 50 MG PO TABS
50.0000 mg | ORAL_TABLET | Freq: Every day | ORAL | 1 refills | Status: DC
Start: 2020-11-10 — End: 2021-04-07

## 2020-11-10 MED ORDER — ALENDRONATE SODIUM 35 MG PO TABS: ORAL_TABLET | ORAL | 3 refills | Status: DC

## 2020-11-10 MED ORDER — REPAGLINIDE 2 MG PO TABS: 2.0000 mg | ORAL_TABLET | Freq: Every day | ORAL | 1 refills | Status: DC

## 2020-11-10 MED ORDER — MONTELUKAST SODIUM 10 MG PO TABS: ORAL_TABLET | ORAL | 1 refills | Status: DC

## 2020-11-10 MED ORDER — ATORVASTATIN CALCIUM 40 MG PO TABS: 40.0000 mg | ORAL_TABLET | Freq: Every day | ORAL | 1 refills | Status: DC

## 2020-11-10 MED ORDER — METFORMIN HCL 500 MG PO TABS
500.0000 mg | ORAL_TABLET | Freq: Two times a day (BID) | ORAL | 1 refills | Status: DC
Start: 2020-11-10 — End: 2021-02-12

## 2020-11-12 LAB — CBC WITH DIFFERENTIAL/PLATELET
Absolute Monocytes: 331 cells/uL (ref 200–950)
Basophils Absolute: 23 cells/uL (ref 0–200)
Basophils Relative: 0.4 %
Eosinophils Absolute: 11 cells/uL — ABNORMAL LOW (ref 15–500)
Eosinophils Relative: 0.2 %
HCT: 35 % (ref 35.0–45.0)
Hemoglobin: 11.3 g/dL — ABNORMAL LOW (ref 11.7–15.5)
Lymphs Abs: 2690 cells/uL (ref 850–3900)
MCH: 30.8 pg (ref 27.0–33.0)
MCHC: 32.3 g/dL (ref 32.0–36.0)
MCV: 95.4 fL (ref 80.0–100.0)
MPV: 11.9 fL (ref 7.5–12.5)
Monocytes Relative: 5.8 %
Neutro Abs: 2645 cells/uL (ref 1500–7800)
Neutrophils Relative %: 46.4 %
Platelets: 236 10*3/uL (ref 140–400)
RBC: 3.67 10*6/uL — ABNORMAL LOW (ref 3.80–5.10)
RDW: 13.5 % (ref 11.0–15.0)
Total Lymphocyte: 47.2 %
WBC: 5.7 10*3/uL (ref 3.8–10.8)

## 2020-11-12 LAB — COMPLETE METABOLIC PANEL WITH GFR
AG Ratio: 1.4 (calc) (ref 1.0–2.5)
ALT: 6 U/L (ref 6–29)
AST: 16 U/L (ref 10–35)
Albumin: 3.9 g/dL (ref 3.6–5.1)
Alkaline phosphatase (APISO): 54 U/L (ref 37–153)
BUN: 11 mg/dL (ref 7–25)
CO2: 26 mmol/L (ref 20–32)
Calcium: 9.2 mg/dL (ref 8.6–10.4)
Chloride: 106 mmol/L (ref 98–110)
Creat: 0.74 mg/dL (ref 0.60–0.95)
Globulin: 2.8 g/dL (calc) (ref 1.9–3.7)
Glucose, Bld: 85 mg/dL (ref 65–99)
Potassium: 4 mmol/L (ref 3.5–5.3)
Sodium: 143 mmol/L (ref 135–146)
Total Bilirubin: 0.4 mg/dL (ref 0.2–1.2)
Total Protein: 6.7 g/dL (ref 6.1–8.1)
eGFR: 80 mL/min/{1.73_m2} (ref >=60)

## 2020-11-12 LAB — LIPID PANEL
Cholesterol: 129 mg/dL (ref <200)
HDL: 57 mg/dL (ref >=50)
LDL Cholesterol (Calc): 59 mg/dL (calc)
Non-HDL Cholesterol (Calc): 72 mg/dL (calc) (ref <130)
Total CHOL/HDL Ratio: 2.3 (calc) (ref <5.0)
Triglycerides: 54 mg/dL (ref <150)

## 2020-11-12 LAB — MICROALBUMIN / CREATININE URINE RATIO
Creatinine, Urine: 154 mg/dL (ref 20–275)
Microalb Creat Ratio: 116 mcg/mg creat — ABNORMAL HIGH (ref <30)
Microalb, Ur: 17.9 mg/dL

## 2020-11-12 LAB — VITAMIN D 25 HYDROXY (VIT D DEFICIENCY, FRACTURES): Vit D, 25-Hydroxy: 61 ng/mL (ref 30–100)

## 2020-11-12 LAB — TSH: TSH: 1.14 mIU/L (ref 0.40–4.50)

## 2020-11-12 LAB — VITAMIN B12: Vitamin B-12: 895 pg/mL (ref 200–1100)

## 2020-11-18 ENCOUNTER — Other Ambulatory Visit: Payer: Self-pay | Admitting: Neurosurgery

## 2020-11-18 ENCOUNTER — Other Ambulatory Visit (HOSPITAL_COMMUNITY): Payer: Self-pay | Admitting: Neurosurgery

## 2020-11-18 DIAGNOSIS — D329 Benign neoplasm of meninges, unspecified: Secondary | ICD-10-CM

## 2020-11-30 ENCOUNTER — Ambulatory Visit
Admission: RE | Admit: 2020-11-30 | Discharge: 2020-11-30 | Disposition: A | Discharge status: Home / Self Care | Payer: Medicare Other | Source: Ambulatory Visit | Attending: Neurosurgery | Admitting: Neurosurgery

## 2020-11-30 ENCOUNTER — Other Ambulatory Visit: Payer: Self-pay

## 2020-11-30 DIAGNOSIS — D329 Benign neoplasm of meninges, unspecified: Secondary | ICD-10-CM | POA: Insufficient documentation

## 2020-11-30 IMAGING — MR MR HEAD WO/W CM
14 series · 47 of 48 positions shown · IV contrast (gadavist)
Comparison: Brain MRI [DATE], [DATE].

CLINICAL DATA: 86-year-old female with planum sphenoidale
meningioma. Restaging.

EXAM:
MRI HEAD WITHOUT AND WITH CONTRAST
TECHNIQUE: Multiplanar, multiecho pulse sequences of the brain and surrounding
structures were obtained without and with intravenous contrast.
CONTRAST:  7mL GADAVIST GADOBUTROL 1 MMOL/ML IV SOLN

[Series 5: ax dwi_tracew · axial · 3.0mm · 0.65mm/px · z∈[-70,+82]mm · 2 of 48 slices shown]
[im 1/48]
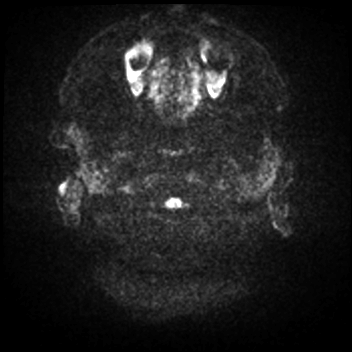
[im 48/48]
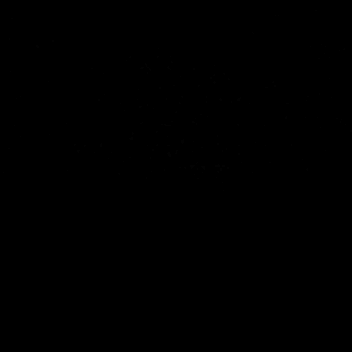

[Series 6: ax dwi_adc · axial · 3.0mm · 0.65mm/px · z∈[-70,+76]mm · 3 of 46 slices shown]
[im 1/46]
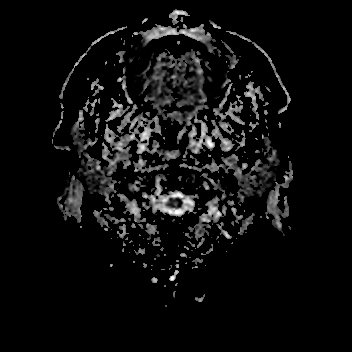
[im 23/46]
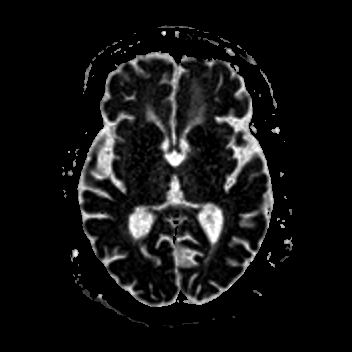
[im 46/46]
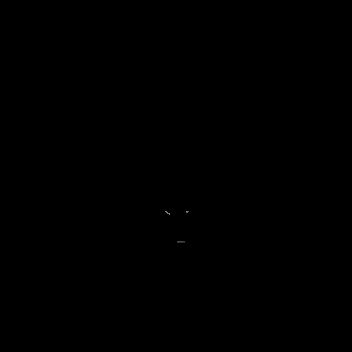

[Series 7: cor dwi_tracew · coronal · 5.0mm · 0.65mm/px · 2 of 34 slices shown]
[im 1/34]
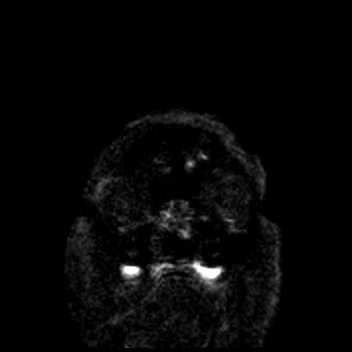
[im 34/34]
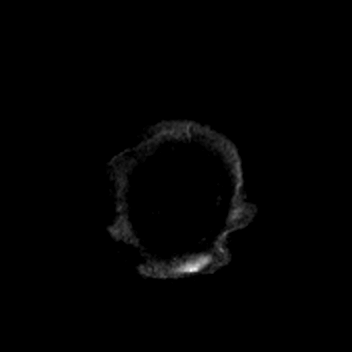

[Series 8: cor dwi_adc · coronal · 5.0mm · 0.65mm/px · 2 of 34 slices shown]
[im 1/34]
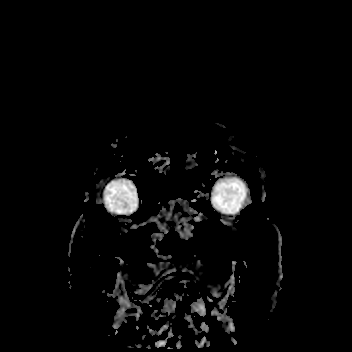
[im 34/34]
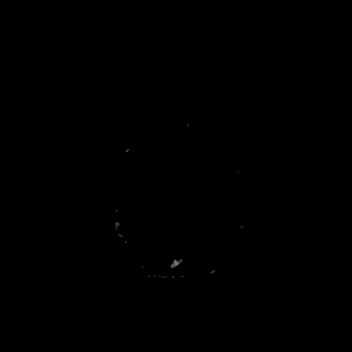

[Series 9: T1 · sagittal · 5.0mm · 0.62mm/px · 1 of 20 slices shown (1 of 2)]
[im 1/20]
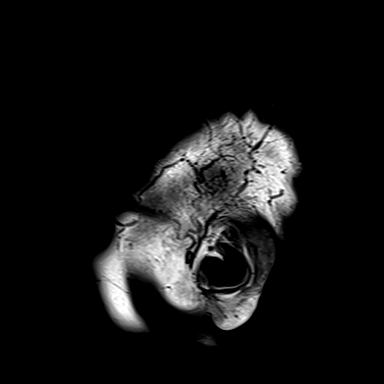

[Series 10: T2 · axial · 5.0mm · 0.53mm/px · z∈[-64,+78]mm · 2 of 25 slices shown]
[im 1/25]
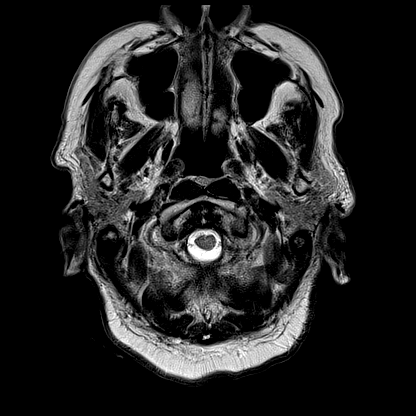
[im 25/25]
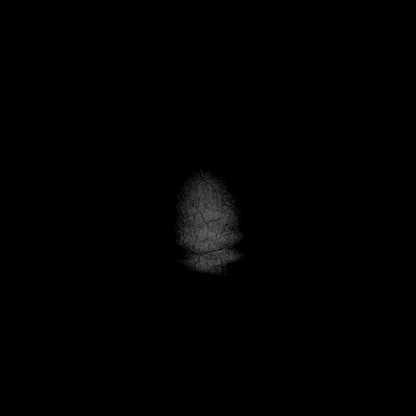

[Series 12: pha_images · axial · 3.0mm · 0.90mm/px · z∈[-81,+90]mm · 4 of 58 slices shown]
[im 1/58]
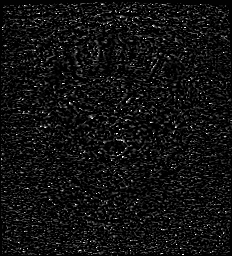
[im 20/58]
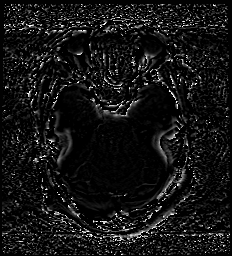
[im 39/58]
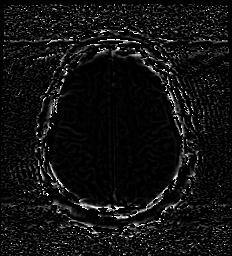
[im 58/58]
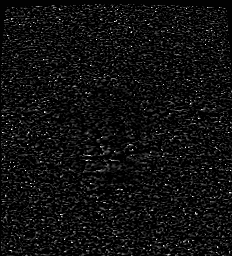

[Series 13: swi_images · axial · 3.0mm · 0.90mm/px · z∈[-81,+93]mm · 4 of 60 slices shown]
[im 1/60]
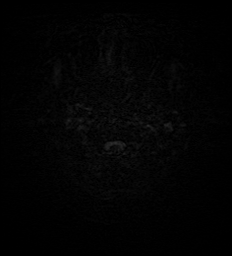
[im 20/60]
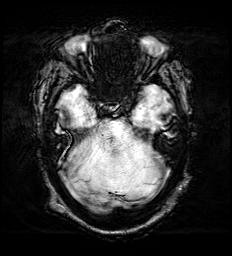
[im 40/60]
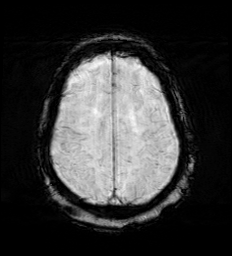
[im 60/60]
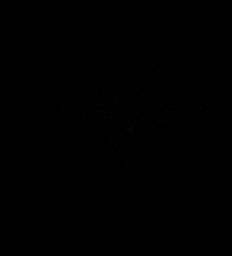

[Series 15: FLAIR · axial · 3.0mm · 0.53mm/px · z∈[-73,+87]mm · 3 of 55 slices shown]
[im 1/55]
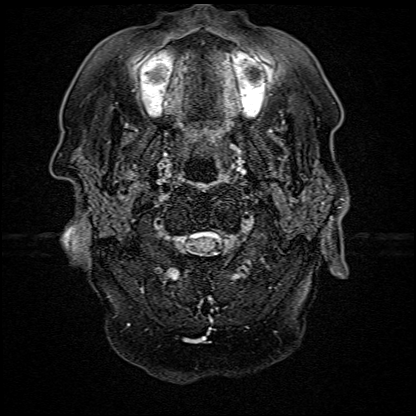
[im 28/55]
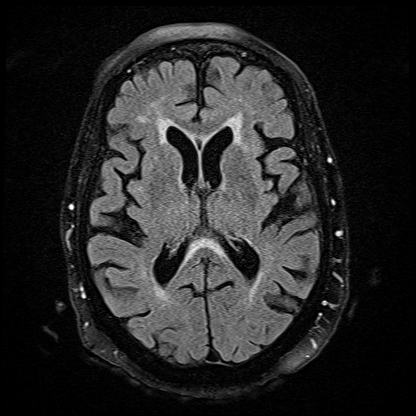
[im 55/55]
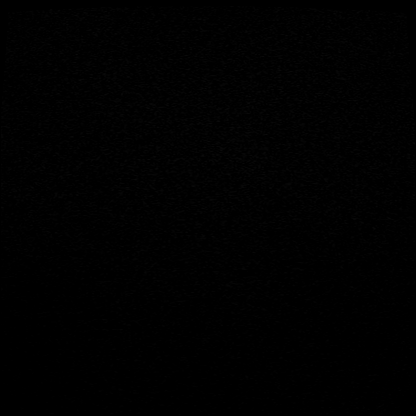

[Series 16: T1 · axial · 1.0mm · 0.98mm/px · z∈[-74,+82]mm · 9 of 160 slices shown (2 of 2)]
[im 1/160]
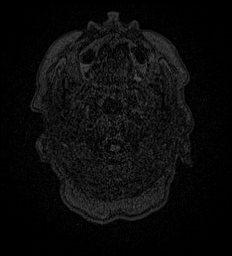
[im 18/160]
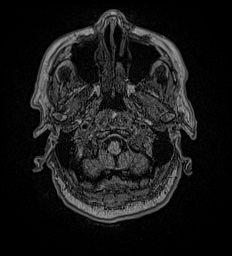
[im 36/160]
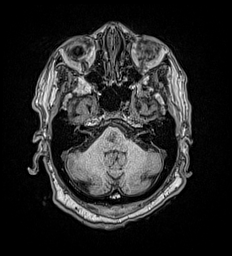
[im 54/160]
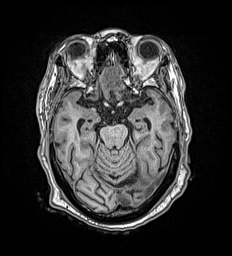
[im 71/160]
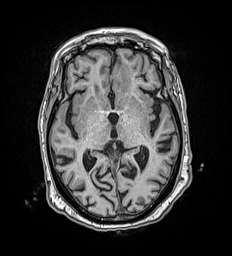
[im 89/160]
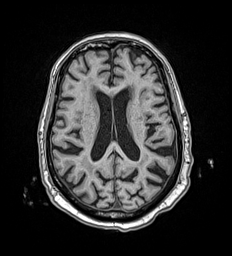
[im 107/160]
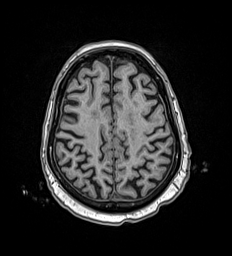
[im 142/160]
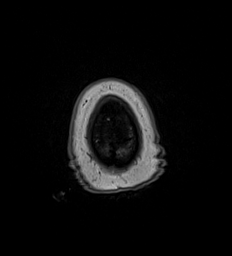
[im 160/160]
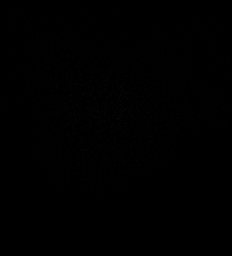

[Series 17: T2 post-contrast · coronal · 5.0mm · 0.57mm/px · 2 of 26 slices shown]
[im 1/26]
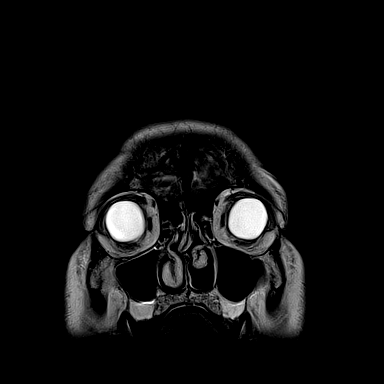
[im 26/26]
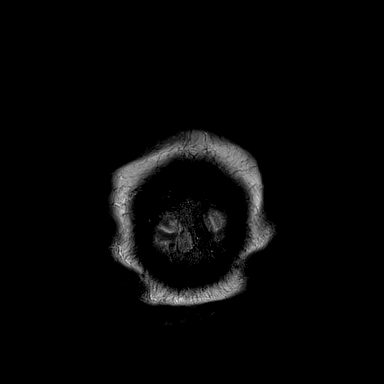

[Series 18: T1 post-contrast · axial · 1.0mm · 0.98mm/px · z∈[-74,+82]mm · 10 of 160 slices shown (1 of 3)]
[im 1/160]
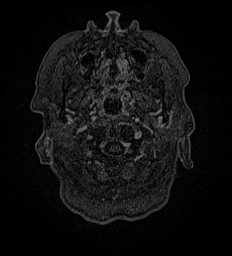
[im 18/160]
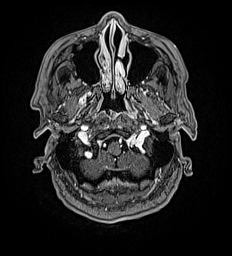
[im 36/160]
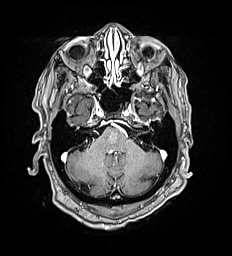
[im 54/160]
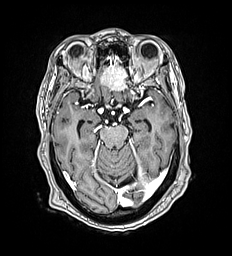
[im 71/160]
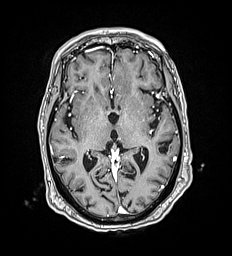
[im 89/160]
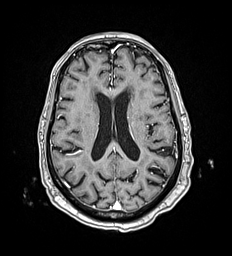
[im 107/160]
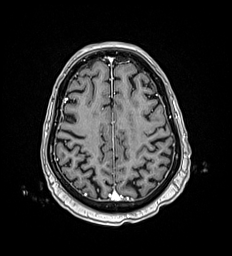
[im 124/160]
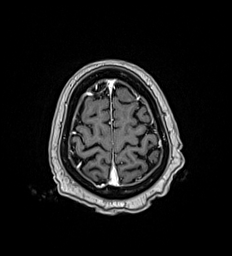
[im 142/160]
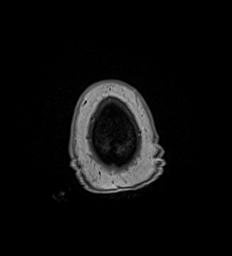
[im 160/160]
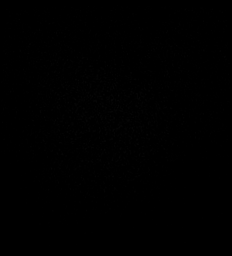

[Series 19: T1 post-contrast · coronal · 5.0mm · 0.57mm/px · 2 of 26 slices shown (2 of 3)]
[im 1/26]
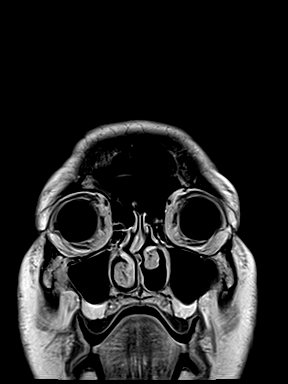
[im 26/26]
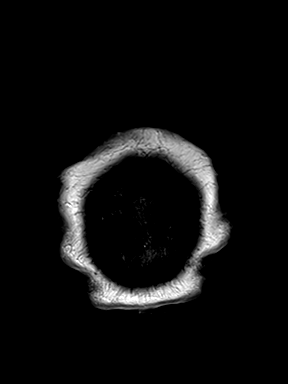

[Series 20: T1 post-contrast · sagittal · 5.0mm · 0.62mm/px · 1 of 20 slices shown (3 of 3)]
[im 1/20]
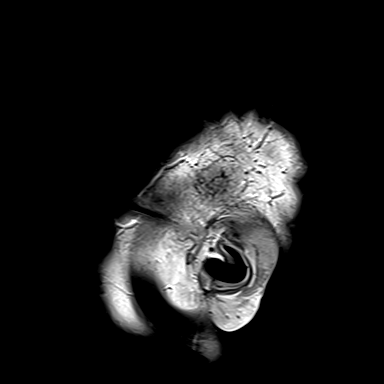

[47 of 48 positions shown; findings below may reference images not displayed]

FINDINGS: Brain: Lobulated left paracentral planum sphenoidale meningioma now
measures 29 x 27 x 18 mm (AP by transverse by CC) versus 28 x 27 by
18 mm in [YM]. Mild regional mass effect is stable. While left
inferior frontal gyrus T2 and FLAIR hyperintensity compatible with
vasogenic edema (series 15, image 24) has increased since [YM] but
is stable from last year.

Patchy chronic T2 hyperintensity, pronounced hemosiderin
susceptibility, and petechial enhancement in the brainstem (series
19, image 14) is stable since [YM] and compatible with benign
vascular malformation, likely a combination of capillary
telangiectasia and developmental venous anomaly (series 19, image
12). No brainstem expansion or mass effect.

No other abnormal intracranial enhancement or dural thickening.
Trace if any additional chronic cerebral blood products (left
periatrial white matter series 13 image 36). No restricted diffusion
to suggest acute infarction. No midline shift, ventriculomegaly,
stable gray and white matter signal in the brain since last year.
Moderate patchy nonspecific bilateral white matter T2 and FLAIR
hyperintensity. Or acute intracranial hemorrhage. Cervicomedullary
junction and pituitary are within normal limits.

Vascular: Major intracranial vascular flow voids are stable. There
is generalized intracranial artery tortuosity. The major dural
venous sinuses are enhancing and appear to be patent.

Skull and upper cervical spine: Negative for age visible cervical
spine. Visualized bone marrow signal is within normal limits.

Sinuses/Orbits: Stable, negative.

Other: Mastoids remain clear. Visible internal auditory structures
appear normal. Negative visible scalp and face.
IMPRESSION: 1. Minimal enlargement of a left paracentral planum sphenoidale
meningioma since [YM], now up to 2.9 cm. Regional mass effect is
stable. Mild vasogenic edema in the left inferior frontal gyrus has
increased since [YM] but is stable from last year.

2. No new intracranial abnormality. Stable benign brainstem slow
flow vascular malformation, and moderate chronic white matter signal
changes.

## 2020-11-30 MED ORDER — GADOBUTROL 1 MMOL/ML IV SOLN
7.0000 mL | Freq: Once | INTRAVENOUS | Status: AC | PRN
  Administered 2020-11-30: 18:00:00 7 mL via INTRAVENOUS

## 2020-12-07 ENCOUNTER — Other Ambulatory Visit: Payer: Self-pay

## 2020-12-07 MED ORDER — OMEPRAZOLE 20 MG PO CPDR
20.0000 mg | DELAYED_RELEASE_CAPSULE | Freq: Two times a day (BID) | ORAL | 0 refills | Status: DC
Start: 2020-12-07 — End: 2021-11-10

## 2020-12-07 NOTE — Progress Notes (Signed)
Sent in refill as requested  prescription

## 2021-02-12 ENCOUNTER — Other Ambulatory Visit: Payer: Self-pay | Admitting: Family Medicine

## 2021-02-12 DIAGNOSIS — L84 Corns and callosities: Secondary | ICD-10-CM

## 2021-02-12 DIAGNOSIS — E1169 Type 2 diabetes mellitus with other specified complication: Secondary | ICD-10-CM

## 2021-02-12 DIAGNOSIS — E11628 Type 2 diabetes mellitus with other skin complications: Secondary | ICD-10-CM

## 2021-02-12 DIAGNOSIS — E113553 Type 2 diabetes mellitus with stable proliferative diabetic retinopathy, bilateral: Secondary | ICD-10-CM

## 2021-03-05 NOTE — Progress Notes (Signed)
Name: Beverly [name]   MRN: [id number]    DOB: 25-Jan-1934   Date:03/08/2021       Progress Note  Subjective  Chief Complaint  Follow Up  HPI  DMII: she is now taking Metformin to 500  mg daily,  taking Prandin two  times a day before her meals, today A1C is down from 6.8 % to 6.3 % 6.4 % down 6.2 %, we went down on dose of Metformin from 1000 mg to 500 mg on her last visit due to weight loss and also risk of hypoglycemia. .  Fasting low 100, to around 150's post prandially.  Eye exam is up to date., she has proliferative diabetic retinopathy and also macular edema of right eye, sees podiatrist for callus, she also has microalbuminuria and we will recheck kidney function today .  She sees podiatrist for her callus every 9 weeks. She denies polyphagia or polydipsia    Meningioma: History of syncopal episode: in 2020 seen by cardiologist, Dr. Caryl Comes, echo showed normal EF. Possibly from change in bp medication because of shortage. She has not had any more episodes Had sub-dural hematoma, she had a repeat MRI done 08/2019 and showed a very mild increase in size of meningioma, repeat MRI done 11/22 but Dr. Lacinda Axon left and she needs to establish with a local neurosurgeon  IMPRESSION - MRI brain done 11/22 1. Minimal enlargement of a left paracentral planum sphenoidale meningioma since 2020, now up to 2.9 cm. Regional mass effect is stable. Mild vasogenic edema in the left inferior frontal gyrus has increased since 2020 but is stable from last year.   2. No new intracranial abnormality. 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   HTN:  BP is at goal, also at goal at home , denies orthostatic changes . No chest pain or palpitation    Hyperlipidemia: taking Atorvastatin, no myalgias. Last LDL done Nov was at goal    AR: well controlled, taking medication prn and is doing well at this time, no rhinorrhea or nasal congestion at this time.  Stable    OA:  it started after a fall 35 plus years ago secondary to a fall, initially right knee, but now seems to be worse on the left side.  Pain is sharp on the left knee, and feels weak on left leg. Discussed PT to strengthen her legs, but happy seeing Chiropractor - Dr. Freddi Che, stable She take Tylenol to control symptoms    Unintentional weight loss/Protein malnutrition : she had lost 18 lbs from 2018 to 2019 .  She states she was under a lot of stress since Summer of 2018 worried about grand-daughter  also her daughter was diagnosed with uterine cancer. Since she lost weight, she got motivated to eat healthier, she feel Summer 2020 and broke her dentures during a syncopal episode.  Doing better weight has been stable in the mid 170 lbs. We will continue to monitor   Osteopenia: taking alendronate, last vitamin D was good , discussed high calcium diet   GERD: no heartburn or indigestion  Patient Active Problem List   Diagnosis Date Noted   Meningioma ([place]) 11/10/2020   Iron deficiency anemia    Subdural hematoma    Syncope and collapse 07/24/2018   Type 2 diabetes mellitus with stable proliferative retinopathy of both eyes, without long-term current use of insulin ([place]) 02/27/2018   Type 2 diabetes mellitus with pressure callus ([place]) 10/24/2017   Diabetic  retinopathy ([place]) 11/12/2015   Obesity (BMI 30.0-34.9) 06/02/2015   Osteoarthritis 06/02/2015   Osteopenia 06/02/2015   Narrowing of intervertebral disc space 06/02/2015   Dyslipidemia associated with type 2 diabetes mellitus ([place]) 06/02/2015   Perennial allergic rhinitis 02/28/2007   Benign essential HTN 06/26/2006   Pure hypercholesterolemia 06/26/2006    Past Surgical History:  Procedure Laterality Date   ABDOMINAL HYSTERECTOMY     BUNIONECTOMY     COLONOSCOPY     COLONOSCOPY WITH PROPOFOL N/A 09/24/2018   Procedure: COLONOSCOPY WITH PROPOFOL;  Surgeon: Lin Landsman, MD;  Location: [facility];  Service:  Gastroenterology;  Laterality: N/A;   ESOPHAGOGASTRODUODENOSCOPY (EGD) WITH PROPOFOL N/A 09/24/2018   Procedure: ESOPHAGOGASTRODUODENOSCOPY (EGD) WITH PROPOFOL;  Surgeon: Lin Landsman, MD;  Location: [facility];  Service: Gastroenterology;  Laterality: N/A;   GIVENS CAPSULE STUDY N/A 10/11/2018   Procedure: GIVENS CAPSULE STUDY;  Surgeon: Lin Landsman, MD;  Location: [facility] ENDOSCOPY;  Service: Gastroenterology;  Laterality: N/A;   HERNIA REPAIR      Family History  Problem Relation Age of Onset   Hypertension Mother    Healthy Father    Healthy Daughter    Raynaud syndrome Daughter    Hypertension Daughter    Stroke Sister     Social History   Tobacco Use   Smoking status: Never   Smokeless tobacco: Never   Tobacco comments:    smoking cessation materials not required  Substance Use Topics   Alcohol use: No    Alcohol/week: 0.0 standard drinks     Current Outpatient Medications:    alendronate (FOSAMAX) 35 MG tablet, Take with a full glass of water on an empty stomach., Disp: 12 tablet, Rfl: 3   atorvastatin (LIPITOR) 40 MG tablet, Take 1 tablet (40 mg total) by mouth daily., Disp: 90 tablet, Rfl: 1   Cholecalciferol (VITAMIN D) 2000 units CAPS, Take 1 capsule (2,000 Units total) by mouth daily., Disp: 30 capsule, Rfl: 0   ferrous sulfate 325 (65 FE) MG tablet, Take 325 mg by mouth 2 (two) times daily with a meal. , Disp: , Rfl:    furosemide (LASIX) 40 MG tablet, Take 1 tablet (40 mg total) by mouth daily as needed., Disp: 90 tablet, Rfl: 0   Glucosamine Sulfate 500 MG TABS, Take 1 tablet by mouth once a week. , Disp: , Rfl:    loratadine (CLARITIN) 10 MG tablet, Take 1 tablet (10 mg total) by mouth daily., Disp: 90 tablet, Rfl: 1   losartan (COZAAR) 50 MG tablet, Take 1 tablet (50 mg total) by mouth daily., Disp: 90 tablet, Rfl: 1   metFORMIN (GLUCOPHAGE) 500 MG tablet, TAKE 1 TABLET (500 MG TOTAL) BY MOUTH 2 (TWO) TIMES DAILY WITH A MEAL. IN PLACE OF 1000 MG  DOSE, Disp: 180 tablet, Rfl: 1   montelukast (SINGULAIR) 10 MG tablet, TAKE 1 TABLET BY MOUTH EVERYDAY AT BEDTIME, Disp: 90 tablet, Rfl: 1   Multiple Vitamin (MULTI-VITAMINS) TABS, Take by mouth., Disp: , Rfl:    omeprazole (PRILOSEC) 20 MG capsule, Take 1 capsule (20 mg total) by mouth 2 (two) times daily before a meal., Disp: 180 capsule, Rfl: 0   Potassium Chloride ER 20 MEQ TBCR, Take 10 mEq by mouth daily as needed. With lasix, Disp: , Rfl:    repaglinide (PRANDIN) 2 MG tablet, Take 1 tablet (2 mg total) by mouth daily before supper., Disp: 90 tablet, Rfl: 1   vitamin B-12 (CYANOCOBALAMIN) 1000 MCG tablet, Take 1,000 mcg by  mouth 3 (three) times a week., Disp: , Rfl:    vitamin C (ASCORBIC ACID) 500 MG tablet, Take 500 mg by mouth daily., Disp: , Rfl:    vitamin e 100 UNT/0.25ML OIL oil, Take by mouth daily., Disp: , Rfl:    Zinc Acetate 50 MG CAPS, Take by mouth daily. , Disp: , Rfl:   Allergies  Allergen Reactions   Lisinopril     I personally reviewed active problem list, medication list, allergies, family history, social history, health maintenance with the patient/caregiver today.   ROS  Constitutional: Negative for fever or weight change.  Respiratory: Negative for cough and shortness of breath.   Cardiovascular: Negative for chest pain or palpitations.  Gastrointestinal: Negative for abdominal pain, no bowel changes.  Musculoskeletal: Negative for gait problem or joint swelling.  Skin: Negative for rash.  Neurological: Negative for dizziness or headache.  No other specific complaints in a complete review of systems (except as listed in HPI above).   Objective  Vitals:   03/08/21 1103  BP: 136/76  Pulse: 85  Resp: 16  SpO2: 98%  Weight: 175 lb (79.4 kg)  Height: 5\' 5"  (1.651 m)    Body mass index is 29.12 kg/m.  Physical Exam  Constitutional: Patient appears well-developed and well-nourished. Obese  No distress.  HEENT: head atraumatic, normocephalic, pupils  equal and reactive to light, neck supple Cardiovascular: Normal rate, regular rhythm and normal heart sounds.  No murmur heard. No BLE edema. Pulmonary/Chest: Effort normal and breath sounds normal. No respiratory distress. Abdominal: Soft.  There is no tenderness. Psychiatric: Patient has a normal mood and affect. behavior is normal. Judgment and thought content normal.   PHQ2/9: Depression screen [facility] 2/9 03/08/2021 11/10/2020 07/14/2020 04/16/2020 03/10/2020  Decreased Interest 0 0 0 0 0  Down, Depressed, Hopeless 0 0 0 0 0  PHQ - 2 Score 0 0 0 0 0  Altered sleeping 0 0 - - -  Tired, decreased energy 0 0 - - -  Change in appetite 0 0 - - -  Feeling bad or failure about yourself  0 0 - - -  Trouble concentrating 0 0 - - -  Moving slowly or fidgety/restless 0 0 - - -  Suicidal thoughts 0 0 - - -  PHQ-9 Score 0 0 - - -  Difficult doing work/chores - - - - -  Some recent data might be hidden    phq 9 is negative   Fall Risk: Fall Risk  03/08/2021 11/10/2020 07/14/2020 04/16/2020 03/10/2020  Falls in the past year? 0 1 0 0 0  Number falls in past yr: 0 1 0 0 0  Injury with Fall? 0 0 0 0 0  Comment - - - - -  Risk for fall due to : No Fall Risks Impaired balance/gait - No Fall Risks -  Risk for fall due to: Comment - - - - -  Follow up Falls prevention discussed Falls prevention discussed - Falls prevention discussed -      Functional Status Survey: Is the patient deaf or have difficulty hearing?: No Does the patient have difficulty seeing, even when wearing glasses/contacts?: No Does the patient have difficulty concentrating, remembering, or making decisions?: No Does the patient have difficulty walking or climbing stairs?: No Does the patient have difficulty dressing or bathing?: No Does the patient have difficulty doing errands alone such as visiting a doctor's office or shopping?: No    Assessment & Plan  1. Type 2  diabetes mellitus with microalbuminuria, without long-term current  use of insulin ([place])   2. Meningioma [facility])  - Ambulatory referral to Neurosurgery  3. Essential (primary) hypertension   4. Primary osteoarthritis of right knee   5. Type 2 diabetes mellitus with pressure callus ([facility])  - HgB A1c  6. Type 2 diabetes mellitus with stable proliferative retinopathy of both eyes, without long-term current use of insulin ([facility])  - Urine Microalbumin w/creat. ratio - BASIC METABOLIC PANEL WITH GFR  7. Dyslipidemia associated with type 2 diabetes mellitus ([place])   8. Dyslipidemia

## 2021-03-08 ENCOUNTER — Other Ambulatory Visit: Payer: Self-pay | Admitting: Gastroenterology

## 2021-03-08 ENCOUNTER — Ambulatory Visit: Payer: Medicare HMO | Admitting: Family Medicine

## 2021-03-08 ENCOUNTER — Encounter: Payer: Self-pay | Admitting: Family Medicine

## 2021-03-08 VITALS — BP 136/76 | HR 85 | Resp 16 | Ht 65.0 in | Wt 175.0 lb

## 2021-03-08 DIAGNOSIS — E1169 Type 2 diabetes mellitus with other specified complication: Secondary | ICD-10-CM

## 2021-03-08 DIAGNOSIS — M1711 Unilateral primary osteoarthritis, right knee: Secondary | ICD-10-CM

## 2021-03-08 DIAGNOSIS — R809 Proteinuria, unspecified: Secondary | ICD-10-CM

## 2021-03-08 DIAGNOSIS — I10 Essential (primary) hypertension: Secondary | ICD-10-CM | POA: Diagnosis not present

## 2021-03-08 DIAGNOSIS — D329 Benign neoplasm of meninges, unspecified: Secondary | ICD-10-CM | POA: Diagnosis not present

## 2021-03-08 DIAGNOSIS — L84 Corns and callosities: Secondary | ICD-10-CM

## 2021-03-08 DIAGNOSIS — E11628 Type 2 diabetes mellitus with other skin complications: Secondary | ICD-10-CM

## 2021-03-08 DIAGNOSIS — E785 Hyperlipidemia, unspecified: Secondary | ICD-10-CM

## 2021-03-08 DIAGNOSIS — E113553 Type 2 diabetes mellitus with stable proliferative diabetic retinopathy, bilateral: Secondary | ICD-10-CM

## 2021-03-08 DIAGNOSIS — E1129 Type 2 diabetes mellitus with other diabetic kidney complication: Secondary | ICD-10-CM | POA: Diagnosis not present

## 2021-03-09 LAB — HEMOGLOBIN A1C
Hgb A1c MFr Bld: 7.5 % of total Hgb — ABNORMAL HIGH (ref <5.7)
Mean Plasma Glucose: 169 mg/dL
eAG (mmol/L): 9.3 mmol/L

## 2021-03-09 LAB — BASIC METABOLIC PANEL WITH GFR
BUN: 11 mg/dL (ref 7–25)
CO2: 30 mmol/L (ref 20–32)
Calcium: 9 mg/dL (ref 8.6–10.4)
Chloride: 106 mmol/L (ref 98–110)
Creat: 0.82 mg/dL (ref 0.60–0.95)
Glucose, Bld: 150 mg/dL — ABNORMAL HIGH (ref 65–99)
Potassium: 3.9 mmol/L (ref 3.5–5.3)
Sodium: 143 mmol/L (ref 135–146)
eGFR: 70 mL/min/{1.73_m2} (ref >=60)

## 2021-03-09 LAB — MICROALBUMIN / CREATININE URINE RATIO
Creatinine, Urine: 147 mg/dL (ref 20–275)
Microalb Creat Ratio: 86 mcg/mg creat — ABNORMAL HIGH (ref <30)
Microalb, Ur: 12.6 mg/dL

## 2021-03-12 ENCOUNTER — Other Ambulatory Visit: Payer: Self-pay | Admitting: Gastroenterology

## 2021-03-16 ENCOUNTER — Other Ambulatory Visit: Payer: Self-pay | Admitting: Gastroenterology

## 2021-03-23 NOTE — Progress Notes (Signed)
 Referring Physician:  [facility] [address] [place],  [place]  Primary Physician:  Glenard Dorette FALCON, MD  History of Present Illness: 03/23/2021 [name] is here today with a chief complaint of Meningioma follow up. She previously saw Dr Bluford and wishes to keep her care in [place]. She denies any symptoms at this time.   Past Surgery: none  08/27/19 by Elspeth Bluford, MD: [name] is here for evaluation of her known anterior cranial fossa meningioma.  She states over the past year that she has had no new symptoms and specifically denies any headaches, vision changes, olfactory changes.  She has had no other medical issues.  She did go for an updated MRI of the brain and is here for review.  08/21/18 by Elspeth Bluford, MD: [name] is here for follow-up from emergency department visit for a fall.  She did state that she started a new blood pressure medication that day.  Her neurologic exam was intact in the emergency department but a head CT did reveal some concern for mild subdural hematoma along the falx.  This was followed up with an MRI of the brain which did reveal a skull base mass in the anterior cranial fossa.  [name] states she has not had any repeat falls.  She denies any vision problems.  She has not had any weakness or numbness.  She denies any changes in her smell.   Review of Systems:  A 10 point review of systems is negative, except for the pertinent positives and negatives detailed in the HPI.  Past Medical History: Past Medical History:  Diagnosis Date  . Bunion   . Cancer (CMS-HCC)   . Cataracts, bilateral   . Congenital foot deformity   . Dermatophytosis of nail   . Gout   . Gout, joint   . Hammertoe   . Hyperkeratosis   . Hyperlipidemia   . Hypertension   . Meningioma (CMS-HCC)   . Onychomycosis   . Subdural hematoma 2020  . Type II or unspecified type diabetes mellitus without mention of complication, not stated as uncontrolled  (CMS-HCC)     Past Surgical History: Past Surgical History:  Procedure Laterality Date  . bunionectomy Left   . HERNIA REPAIR    . HYSTERECTOMY      Allergies: Allergies as of 03/23/2021 - Reviewed 03/23/2021  Allergen Reaction Noted  . Lisinopril Unknown 11/10/2016    Medications: Outpatient Encounter Medications as of 03/23/2021  Medication Sig Dispense Refill  . acetaminophen  (TYLENOL ) 650 MG ER tablet Take 650 mg by mouth every 6 (six) hours as needed for Pain    . alendronate  (FOSAMAX ) 35 MG tablet TAKE 1 TABLET BY MOUTH WEEKLY    . ascorbic acid , vitamin C , (VITAMIN C ) 500 MG tablet Take 500 mg by mouth once daily    . atorvastatin  (LIPITOR) 40 MG tablet Take by mouth.    . cholecalciferol (VITAMIN D3) 2,000 unit capsule Take by mouth    . cyanocobalamin  (VITAMIN B12) 1000 MCG tablet Take 1,000 mcg by mouth as directed Takes 3 x a week    . ferrous sulfate  325 (65 FE) MG tablet Take 325 mg by mouth 2 (two) times daily with meals       . FUROsemide  (LASIX ) 40 MG tablet Take 40 mg by mouth once daily.    SABRA glucosamine sulfate 500 mg Tab Take 1 tablet by mouth once daily.    . loratadine  (CLARITIN ) 10 mg capsule Take 10  mg by mouth once daily    . losartan  (COZAAR ) 25 MG tablet TAKE 2 TABLETS (50 MG) BY MOUTH EVERY DAY 50 MG TABS ON BACKORDER    . metFORMIN  (GLUCOPHAGE ) 1000 MG tablet Take by mouth 2 (two) times daily with meals       . montelukast  (SINGULAIR ) 10 mg tablet Take 10 mg by mouth once daily.    . multivitamin tablet Take 1 tablet by mouth once daily.    . omeprazole  (PRILOSEC) 20 MG DR capsule Take by mouth    . potassium chloride  (K-TAB ) 20 mEq TbER ER tablet Take 10 mEq by mouth once daily       . repaglinide  (PRANDIN ) 2 MG tablet Take 2 mg by mouth 2 (two) times daily.    SABRA zinc acetate 50 mg (zinc) Cap Take 1 capsule by mouth once daily.     No facility-administered encounter medications on file as of 03/23/2021.    Social History: Social History    Tobacco Use  . Smoking status: Never  . Smokeless tobacco: Never  Substance Use Topics  . Alcohol use: No    Alcohol/week: 0.0 standard drinks    Family Medical History: Family History  Problem Relation Age of Onset  . High blood pressure (Hypertension) Mother   . No Known Problems Father     Physical Examination: Vitals:   03/23/21 1050  BP: 130/76  Weight: 79.2 kg (174 lb 9.6 oz)  Height: 165.1 cm (5' 5)  PainSc: 0-No pain  PainLoc: Head - Entire    General: Patient is well developed, well nourished, calm, collected, and in no apparent distress. Attention to examination is appropriate.  Psychiatric: Patient is non-anxious.  Head:  Pupils equal, round, and reactive to light.  ENT:  Oral mucosa appears well hydrated.  Neck:   Supple.  Full range of motion.  Respiratory: Patient is breathing without any difficulty.  Extremities: No edema.  Vascular: Palpable dorsal pedal pulses.  Skin:   On exposed skin, there are no abnormal skin lesions.  NEUROLOGICAL:     Awake, alert, oriented to person, place, and time.  Speech is clear and fluent. Fund of knowledge is appropriate.   Cranial Nerves: Pupils equal round and reactive to light.  Facial tone is symmetric.  Facial sensation is symmetric. Shoulder shrug is symmetric. Tongue protrusion is midline.  There is no pronator drift.   Strength: Side Biceps Triceps Deltoid Interossei Grip Wrist Ext. Wrist Flex.  R 5 5 5 5 5 5 5   L 5 5 5 5 5 5 5    Side Iliopsoas Quads Hamstring PF DF EHL  R 5 5 5 5 5 5   L 5 5 5 5 5 5    Reflexes are 1+ and symmetric at the biceps, triceps, brachioradialis, patella and achilles.   Hoffman's is absent.  Clonus is not present.  Toes are down-going.  Bilateral upper and lower extremity sensation is intact to light touch.    No evidence of dysmetria noted.  Gait is normal.    Medical Decision Making  Imaging: MRI Brain 11/30/2020 IMPRESSION:  1. Minimal enlargement of a left  paracentral planum sphenoidale  meningioma since 2020, now up to 2.9 cm. Regional mass effect is  stable. Mild vasogenic edema in the left inferior frontal gyrus has  increased since 2020 but is stable from last year.   2. No new intracranial abnormality. Stable benign brainstem slow  flow vascular malformation, and moderate chronic white matter signal  changes.  Electronically Signed  By: VEAR Hurst M.D.  On: 12/02/2020 08:22   I have personally reviewed the images and agree with the above interpretation.  Assessment and Plan: [name] is a pleasant 87 y.o. female with a likely meningioma.  It has very minimal increase in size over the last 3 years.  She has no symptoms currently.  We reviewed the options including ongoing serial imaging versus symptomatic guided imaging.  She would like to stop imaging and will let us  know if she develops any new symptoms that worry her.  We will be happy to get additional imaging at that time.   I spent a total of 20 minutes in both face-to-face and non-face-to-face activities for this visit on the date of this encounter.   Thank you for involving me in the care of this patient.   This note was partially dictated using voice recognition software, so please excuse any errors that were not corrected.    Reeves Daisy MD, [facility] Department of Neurosurgery

## 2021-03-25 ENCOUNTER — Other Ambulatory Visit: Payer: Self-pay | Admitting: Gastroenterology

## 2021-04-07 ENCOUNTER — Other Ambulatory Visit: Payer: Self-pay | Admitting: Family Medicine

## 2021-04-07 DIAGNOSIS — I10 Essential (primary) hypertension: Secondary | ICD-10-CM

## 2021-04-15 LAB — HM DIABETES EYE EXAM

## 2021-04-20 ENCOUNTER — Ambulatory Visit (INDEPENDENT_AMBULATORY_CARE_PROVIDER_SITE_OTHER): Payer: Medicare HMO

## 2021-04-20 VITALS — BP 138/76 | HR 78 | Temp 97.8°F | Resp 16 | Ht 65.0 in | Wt 171.5 lb

## 2021-04-20 DIAGNOSIS — Z0000 Encounter for general adult medical examination without abnormal findings: Secondary | ICD-10-CM

## 2021-04-20 NOTE — Progress Notes (Signed)
? ?Subjective:  ? Beverly [name] is a 87 y.o. female who presents for Medicare Annual (Subsequent) preventive examination. ? ?Review of Systems    ? ?Cardiac Risk Factors include: advanced age (>77mn, >>36women);diabetes mellitus;dyslipidemia;hypertension ? ?   ?Objective:  ?  ?Today's Vitals  ? 04/20/21 0929  ?BP: 138/76  ?Pulse: 78  ?Resp: 16  ?Temp: 97.8 ?F (36.6 ?C)  ?TempSrc: Oral  ?SpO2: 98%  ?Weight: 171 lb 8 oz (77.8 kg)  ?Height: '5\' 5"'$  (18.242m)  ? ?Body mass index is 28.54 kg/m?. ? ? ?  04/20/2021  ?  9:44 AM 04/16/2020  ?  9:29 AM 04/16/2019  ?  9:45 AM 09/24/2018  ?  8:34 AM 07/24/2018  ?  1:17 PM 03/28/2017  ?  1:22 PM 06/14/2016  ? 10:39 AM  ?Advanced Directives  ?Does Patient Have a Medical Advance Directive? Yes Yes Yes Yes No Yes Yes  ?Type of AParamedicof [place]Living will [place]Living will [place]Living will [place]Living will  [place]Living will [place]Living will  ?Does patient want to make changes to medical advance directive?  Yes (MAU/Ambulatory/Procedural Areas - Information given)    Yes (MAU/Ambulatory/Procedural Areas - Information given)   ?Copy of [place] Chart? No - copy requested No - copy requested No - copy requested No - copy requested  No - copy requested Yes  ?Would patient like information on creating a medical advance directive?     No - Patient declined    ? ? ?Current Medications (verified) ?Outpatient Encounter Medications as of 04/20/2021  ?Medication Sig  ? acetaminophen (TYLENOL) 650 MG CR tablet Take 1 tablet by mouth every 6 (six) hours as needed.  ? alendronate (FOSAMAX) 35 MG tablet Take with a full glass of water on an empty stomach.  ? atorvastatin (LIPITOR) 40 MG tablet Take 1 tablet (40 mg total) by mouth daily.  ? Cholecalciferol (VITAMIN D) 2000 units CAPS Take 1 capsule (2,000 Units total) by mouth daily.   ? ferrous sulfate 325 (65 FE) MG tablet Take 325 mg by mouth 2 (two) times daily with a meal.   ? furosemide (LASIX) 40 MG tablet Take 1 tablet (40 mg total) by mouth daily as needed.  ? Glucosamine Sulfate 500 MG TABS Take 1 tablet by mouth once a week.   ? loratadine (CLARITIN) 10 MG tablet Take 1 tablet (10 mg total) by mouth daily.  ? losartan (COZAAR) 50 MG tablet TAKE 1 TABLET BY MOUTH EVERY DAY  ? metFORMIN (GLUCOPHAGE) 500 MG tablet TAKE 1 TABLET (500 MG TOTAL) BY MOUTH 2 (TWO) TIMES DAILY WITH A MEAL. IN PLACE OF 1000 MG DOSE  ? montelukast (SINGULAIR) 10 MG tablet TAKE 1 TABLET BY MOUTH EVERYDAY AT BEDTIME  ? Multiple Vitamin (MULTI-VITAMINS) TABS Take by mouth.  ? omeprazole (PRILOSEC) 20 MG capsule Take 1 capsule (20 mg total) by mouth 2 (two) times daily before a meal.  ? Potassium Chloride ER 20 MEQ TBCR Take 10 mEq by mouth daily as needed. With lasix  ? repaglinide (PRANDIN) 2 MG tablet Take 1 tablet (2 mg total) by mouth daily before supper.  ? vitamin B-12 (CYANOCOBALAMIN) 1000 MCG tablet Take 1,000 mcg by mouth 3 (three) times a week.  ? vitamin C (ASCORBIC ACID) 500 MG tablet Take 500 mg by mouth daily.  ? vitamin E 180 MG (400 UNITS) capsule Take 400  Units by mouth daily.  ? Zinc Acetate 50 MG CAPS Take by mouth daily.   ? [DISCONTINUED] vitamin e 100 UNT/0.25ML OIL oil Take by mouth daily.  ? ?No facility-administered encounter medications on file as of 04/20/2021.  ? ? ?Allergies (verified) ?Lisinopril  ? ?History: ?Past Medical History:  ?Diagnosis Date  ? Allergy   ? Arthritis   ? Diabetes mellitus without complication ([place])   ? Hyperlipidemia   ? Hypertension   ? ?Past Surgical History:  ?Procedure Laterality Date  ? ABDOMINAL HYSTERECTOMY    ? BUNIONECTOMY    ? COLONOSCOPY    ? COLONOSCOPY WITH PROPOFOL N/A 09/24/2018  ? Procedure: COLONOSCOPY WITH PROPOFOL;  Surgeon: Lin Landsman, MD;  Location[facility] ENDOSCOPY;  Service: Gastroenterology;  Laterality: N/A;  ?  ESOPHAGOGASTRODUODENOSCOPY (EGD) WITH PROPOFOL N/A 09/24/2018  ? Procedure: ESOPHAGOGASTRODUODENOSCOPY (EGD) WITH PROPOFOL;  Surgeon: Lin Landsman, MD;  Location: [facility] END[facility]COPY;  Service: Gastroenterology;  Laterality: N/A;  ? GIVENS CAPSULE STUDY N/A 10/11/2018  ? Procedure: GIVENS CAPSULE STUDY;  Surgeon: Lin Landsman, MD;  Location: [facility] ENDOSCOPY;  Service: Gastroenterology;  Laterality: N/A;  ? HERNIA REPAIR    ? ?Family History  ?Problem Relation Age of Onset  ? Hypertension Mother   ? Healthy Father   ? Healthy Daughter   ? Raynaud syndrome Daughter   ? Hypertension Daughter   ? Stroke Sister   ? ?Social History  ? ?Socioeconomic History  ? Marital status: Widowed  ?  Spouse name: [name]  ? Number of children: 2  ? Years of education: Not on file  ? Highest education level: Bachelor's degree (e.g., BA, AB, BS)  ?Occupational History  ? Occupation: Retired  ?Tobacco Use  ? Smoking status: Never  ? Smokeless tobacco: Never  ? Tobacco comments:  ?  smoking cessation materials not required  ?Vaping Use  ? Vaping Use: Never used  ?Substance and Sexual Activity  ? Alcohol use: No  ?  Alcohol/week: 0.0 standard drinks  ? Drug use: No  ? Sexual activity: Not Currently  ?Other Topics Concern  ? Not on file  ?Social History Narrative  ? Pt lives alone.   ? ?Social Determinants of Health  ? ?Financial Resource Strain: Low Risk   ? Difficulty of Paying Living Expenses: Not hard at all  ?Food Insecurity: No Food Insecurity  ? Worried About Charity fundraiser in the Last Year: Never true  ? Ran Out of Food in the Last Year: Never true  ?Transportation Needs: No Transportation Needs  ? Lack of Transportation (Medical): No  ? Lack of Transportation (Non-Medical): No  ?Physical Activity: Sufficiently Active  ? Days of Exercise per Week: 3 days  ? Minutes of Exercise per Session: 50 min  ?Stress: No Stress Concern Present  ? Feeling of Stress : Not at all  ?Social Connections: Moderately Isolated  ? Frequency of  Communication with Friends and Family: More than three times a week  ? Frequency of Social Gatherings with Friends and Family: Twice a week  ? Attends Religious Services: More than 4 times per year  ? Active Member of Clubs or Organizations: No  ? Attends Archivist Meetings: Never  ? Marital Status: Widowed  ? ? ?Tobacco Counseling ?Counseling given: Not Answered ?Tobacco comments: smoking cessation materials not required ? ? ?Clinical Intake: ? ?Pre-visit preparation completed: Yes ? ?Pain : No/denies pain ? ?  ? ?BMI - recorded: 28.54 ?Nutritional Status: BMI 25 -29 Overweight ?Nutritional  Risks: None ?Diabetes: Yes ?CBG done?: No ?Did pt. bring in CBG monitor from home?: No ? ?How often do you need to have someone help you when you read instructions, pamphlets, or other written materials from your doctor or pharmacy?: 1 - Never ? ?Nutrition Risk Assessment: ? ?Has the patient had any N/V/D within the last 2 months?  No  ?Does the patient have any non-healing wounds?  No  ?Has the patient had any unintentional weight loss or weight gain?  No  ? ?Diabetes: ? ?Is the patient diabetic?  Yes  ?If diabetic, was a CBG obtained today?  No  ?Did the patient bring in their glucometer from home?  No  ?How often do you monitor your CBG's? Every other day.  ? ?Financial Strains and Diabetes Management: ? ?Are you having any financial strains with the device, your supplies or your medication? No .  ?Does the patient want to be seen by Chronic Care Management for management of their diabetes?  No  ?Would the patient like to be referred to a Nutritionist or for Diabetic Management?  No  ? ?Diabetic Exams: ? ?Diabetic Eye Exam: Completed 05/21/20.  ? ?Diabetic Foot Exam: Completed 11/10/20.  ? ?Interpreter Needed?: No ? ?Information entered by :: [name] LPN ? ? ?Activities of Daily Living ? ?  04/20/2021  ?  9:45 AM 03/08/2021  ? 10:58 AM  ?In your present state of health, do you have any difficulty performing the  following activities:  ?Hearing? 0 0  ?Vision? 0 0  ?Difficulty concentrating or making decisions? 0 0  ?Walking or climbing stairs? 0 0  ?Dressing or bathing? 0 0  ?Doing errands, shopping? 0 0  ?Preparing Food and eating ? N

## 2021-04-20 NOTE — Patient Instructions (Signed)
Beverly [name] , ?Thank you for taking time to come for your Medicare Wellness Visit. I appreciate your ongoing commitment to your health goals. Please review the following plan we discussed and let me know if I can assist you in the future.  ? ?Screening recommendations/referrals: ?Colonoscopy: no longer required ?Mammogram: no longer required ?Bone Density: no longer required ?Recommended yearly ophthalmology/optometry visit for glaucoma screening and checkup ?Recommended yearly dental visit for hygiene and checkup ? ?Vaccinations: ?Influenza vaccine: done 10/22/20 ?Pneumococcal vaccine: done 09/17/13 ?Tdap vaccine: done 07/24/18 ?Shingles vaccine: done 10/23/19 & 12/20/19   ?Covid-19:done 01/24/19, 02/14/19, 02/18/20 & 11/11/20 ? ?Advanced directives: Please bring a copy of your health care power of attorney and living will to the office at your convenience.  ? ?Conditions/risks identified: Keep up the great work! ? ?Next appointment: Follow up in one year for your annual wellness visit  ? ? ?Preventive Care 85 Years and Older, Female ?Preventive care refers to lifestyle choices and visits with your health care provider that can promote health and wellness. ?What does preventive care include? ?A yearly physical exam. This is also called an annual well check. ?Dental exams once or twice a year. ?Routine eye exams. Ask your health care provider how often you should have your eyes checked. ?Personal lifestyle choices, including: ?Daily care of your teeth and gums. ?Regular physical activity. ?Eating a healthy diet. ?Avoiding tobacco and drug use. ?Limiting alcohol use. ?Practicing safe sex. ?Taking low-dose aspirin every day. ?Taking vitamin and mineral supplements as recommended by your health care provider. ?What happens during an annual well check? ?The services and screenings done by your health care provider during your annual well check will depend on your age, overall health, lifestyle risk factors, and family history  of disease. ?Counseling  ?Your health care provider may ask you questions about your: ?Alcohol use. ?Tobacco use. ?Drug use. ?Emotional well-being. ?Home and relationship well-being. ?Sexual activity. ?Eating habits. ?History of falls. ?Memory and ability to understand (cognition). ?Work and work Statistician. ?Reproductive health. ?Screening  ?You may have the following tests or measurements: ?Height, weight, and BMI. ?Blood pressure. ?Lipid and cholesterol levels. These may be checked every 5 years, or more frequently if you are over 76 years old. ?Skin check. ?Lung cancer screening. You may have this screening every year starting at age 61 if you have a 30-pack-year history of smoking and currently smoke or have quit within the past 15 years. ?Fecal occult blood test (FOBT) of the stool. You may have this test every year starting at age 55. ?Flexible sigmoidoscopy or colonoscopy. You may have a sigmoidoscopy every 5 years or a colonoscopy every 10 years starting at age 1. ?Hepatitis C blood test. ?Hepatitis B blood test. ?Sexually transmitted disease (STD) testing. ?Diabetes screening. This is done by checking your blood sugar (glucose) after you have not eaten for a while (fasting). You may have this done every 1-3 years. ?Bone density scan. This is done to screen for osteoporosis. You may have this done starting at age 37. ?Mammogram. This may be done every 1-2 years. Talk to your health care provider about how often you should have regular mammograms. ?Talk with your health care provider about your test results, treatment options, and if necessary, the need for more tests. ?Vaccines  ?Your health care provider may recommend certain vaccines, such as: ?Influenza vaccine. This is recommended every year. ?Tetanus, diphtheria, and acellular pertussis (Tdap, Td) vaccine. You may need a Td booster every 10 years. ?Zoster vaccine. You  may need this after age 58. ?Pneumococcal 13-valent conjugate (PCV13) vaccine. One  dose is recommended after age 98. ?Pneumococcal polysaccharide (PPSV23) vaccine. One dose is recommended after age 64. ?Talk to your health care provider about which screenings and vaccines you need and how often you need them. ?This information is not intended to replace advice given to you by your health care provider. Make sure you discuss any questions you have with your health care provider. ?Document Released: 01/16/2015 Document Revised: 09/09/2015 Document Reviewed: 10/21/2014 ?Elsevier Interactive Patient Education ? 2017 [place]. ? ?Fall Prevention in the Home ?Falls can cause injuries. They can happen to people of all ages. There are many things you can do to make your home safe and to help prevent falls. ?What can I do on the outside of my home? ?Regularly fix the edges of walkways and driveways and fix any cracks. ?Remove anything that might make you trip as you walk through a door, such as a raised step or threshold. ?Trim any bushes or trees on the path to your home. ?Use bright outdoor lighting. ?Clear any walking paths of anything that might make someone trip, such as rocks or tools. ?Regularly check to see if handrails are loose or broken. Make sure that both sides of any steps have handrails. ?Any raised decks and porches should have guardrails on the edges. ?Have any leaves, snow, or ice cleared regularly. ?Use sand or salt on walking paths during winter. ?Clean up any spills in your garage right away. This includes oil or grease spills. ?What can I do in the bathroom? ?Use night lights. ?Install grab bars by the toilet and in the tub and shower. Do not use towel bars as grab bars. ?Use non-skid mats or decals in the tub or shower. ?If you need to sit down in the shower, use a plastic, non-slip stool. ?Keep the floor dry. Clean up any water that spills on the floor as soon as it happens. ?Remove soap buildup in the tub or shower regularly. ?Attach bath mats securely with double-sided  non-slip rug tape. ?Do not have throw rugs and other things on the floor that can make you trip. ?What can I do in the bedroom? ?Use night lights. ?Make sure that you have a light by your bed that is easy to reach. ?Do not use any sheets or blankets that are too big for your bed. They should not hang down onto the floor. ?Have a firm chair that has side arms. You can use this for support while you get dressed. ?Do not have throw rugs and other things on the floor that can make you trip. ?What can I do in the kitchen? ?Clean up any spills right away. ?Avoid walking on wet floors. ?Keep items that you use a lot in easy-to-reach places. ?If you need to reach something above you, use a strong step stool that has a grab bar. ?Keep electrical cords out of the way. ?Do not use floor polish or wax that makes floors slippery. If you must use wax, use non-skid floor wax. ?Do not have throw rugs and other things on the floor that can make you trip. ?What can I do with my stairs? ?Do not leave any items on the stairs. ?Make sure that there are handrails on both sides of the stairs and use them. Fix handrails that are broken or loose. Make sure that handrails are as long as the stairways. ?Check any carpeting to make sure that it is firmly  attached to the stairs. Fix any carpet that is loose or worn. ?Avoid having throw rugs at the top or bottom of the stairs. If you do have throw rugs, attach them to the floor with carpet tape. ?Make sure that you have a light switch at the top of the stairs and the bottom of the stairs. If you do not have them, ask someone to add them for you. ?What else can I do to help prevent falls? ?Wear shoes that: ?Do not have high heels. ?Have rubber bottoms. ?Are comfortable and fit you well. ?Are closed at the toe. Do not wear sandals. ?If you use a stepladder: ?Make sure that it is fully opened. Do not climb a closed stepladder. ?Make sure that both sides of the stepladder are locked into place. ?Ask  someone to hold it for you, if possible. ?Clearly mark and make sure that you can see: ?Any grab bars or handrails. ?First and last steps. ?Where the edge of each step is. ?Use tools that help you move arou

## 2021-05-09 ENCOUNTER — Other Ambulatory Visit: Payer: Self-pay | Admitting: Family Medicine

## 2021-05-09 DIAGNOSIS — E11628 Type 2 diabetes mellitus with other skin complications: Secondary | ICD-10-CM

## 2021-05-09 DIAGNOSIS — E1169 Type 2 diabetes mellitus with other specified complication: Secondary | ICD-10-CM

## 2021-05-09 DIAGNOSIS — E113553 Type 2 diabetes mellitus with stable proliferative diabetic retinopathy, bilateral: Secondary | ICD-10-CM

## 2021-07-08 NOTE — Progress Notes (Signed)
Name: Beverly [name]   MRN: [id number]    DOB: 08-Mar-1934   Date:07/09/2021       Progress Note  Subjective  Chief Complaint  Follow Up  HPI  DMII: she is now taking Metformin to 500  mg daily,  taking Prandin two  times a day before her meals, today A1C is down from 6.8 % to 6.3 % 6.4 % down 6.2 % it went up to 7.5 5 when we went down on dose of Metfomrin but today A1C is down at 6.4 % Fasting low 100, to around 150's post prandially.  Eye exam is up to date., she has proliferative diabetic retinopathy and also macular edema of right eye, sees podiatrist for callus, she also has microalbuminuria , she is on ARB. She denies polyphagia or polydipsia or polyuria    Meningioma: History of syncopal episode: in 2020 seen by cardiologist, Dr. Caryl Comes, echo showed normal EF. Possibly from change in bp medication because of shortage. She has not had any more episodes Had sub-dural hematoma, she had a repeat MRI done 08/2019 and showed a very mild increase in size of meningioma, repeat MRI done 11/22 but Dr. Lacinda Axon left and she is now seeing Dr. Cari Caraway and since very mild change no longer repeating imaging but to notify him if any symptoms such as headaches, double vision or balance problems   IMPRESSION - MRI brain done 11/22 1. Minimal enlargement of a left paracentral planum sphenoidale meningioma since 2020, now up to 2.9 cm. Regional mass effect is stable. Mild vasogenic edema in the left inferior frontal gyrus has increased since 2020 but is stable from last year.   2. No new intracranial abnormality. 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   HTN:  BP is towards low end of normal today, she states at home is between 120-135 / 70 denies orthostatic changes . No chest pain or palpitation    Hyperlipidemia: taking Atorvastatin, no myalgias. Last LDL 59 done 11/2020, recheck yearly    AR: well controlled, taking medication prn, no  rhinorrhea or nasal congestion at this time.    OA:  it started after a fall 35 plus years ago secondary to a fall, initially right knee, but now seems to be worse on the left side.  Pain is sharp on the left knee, and feels weak on left leg. Discussed PT to strengthen her legs,  however she is seeing chiropractor instead and doing well. Denies any recent Falls    Unintentional weight loss/Protein malnutrition : she had lost 18 lbs from 2018 to 2019 .  She states she was under a lot of stress since Summer of 2018 worried about grand-daughter  also her daughter was diagnosed with uterine cancer. Since she lost weight, she got motivated to eat healthier, she fell  Summer 2020 and broke her dentures during a syncopal episode.  Weight was stable in the 170 lbs however today is down to 167 lbs. Discussed importance of increasing calorie intake  Osteopenia: taking alendronate, last vitamin D was good , discussed high calcium diet   GERD: no heartburn or indigestion, controlled with Omeprazole   Patient Active Problem List   Diagnosis Date Noted   Meningioma ([place]) 11/10/2020   Iron deficiency anemia    Subdural hematoma (HCC)    Syncope and collapse 07/24/2018   Type 2 diabetes mellitus with stable proliferative retinopathy of both eyes, without long-term current use of insulin ([place]) 02/27/2018  Type 2 diabetes mellitus with pressure callus (HCC) 10/24/2017   Diabetic retinopathy ([place]) 11/12/2015   Obesity (BMI 30.0-34.9) 06/02/2015   Osteoarthritis 06/02/2015   Osteopenia 06/02/2015   Narrowing of intervertebral disc space 06/02/2015   Dyslipidemia associated with type 2 diabetes mellitus ([place]) 06/02/2015   Perennial allergic rhinitis 02/28/2007   Benign essential HTN 06/26/2006   Pure hypercholesterolemia 06/26/2006    Past Surgical History:  Procedure Laterality Date   ABDOMINAL HYSTERECTOMY     BUNIONECTOMY     COLONOSCOPY     COLONOSCOPY WITH PROPOFOL N/A 09/24/2018   Procedure:  COLONOSCOPY WITH PROPOFOL;  Surgeon: Lin Landsman, MD;  Location: [place];  Service: Gastroenterology;  Laterality: N/A;   ESOPHAGOGASTRODUODENOSCOPY (EGD) WITH PROPOFOL N/A 09/24/2018   Procedure: ESOPHAGOGASTRODUODENOSCOPY (EGD) WITH PROPOFOL;  Surgeon: Lin Landsman, MD;  Location: [place];  Service: Gastroenterology;  Laterality: N/A;   GIVENS CAPSULE STUDY N/A 10/11/2018   Procedure: GIVENS CAPSULE STUDY;  Surgeon: Lin Landsman, MD;  Location: [facility] END[facility]COPY;  Service: Gastroenterology;  Laterality: N/A;   HERNIA REPAIR      Family History  Problem Relation Age of Onset   Hypertension Mother    Healthy Father    Healthy Daughter    Raynaud syndrome Daughter    Hypertension Daughter    Stroke Sister     Social History   Tobacco Use   Smoking status: Never   Smokeless tobacco: Never   Tobacco comments:    smoking cessation materials not required  Substance Use Topics   Alcohol use: No    Alcohol/week: 0.0 standard drinks of alcohol     Current Outpatient Medications:    acetaminophen (TYLENOL) 650 MG CR tablet, Take 1 tablet by mouth every 6 (six) hours as needed., Disp: , Rfl:    alendronate (FOSAMAX) 35 MG tablet, Take with a full glass of water on an empty stomach., Disp: 12 tablet, Rfl: 3   atorvastatin (LIPITOR) 40 MG tablet, Take 1 tablet (40 mg total) by mouth daily., Disp: 90 tablet, Rfl: 1   Cholecalciferol (VITAMIN D) 2000 units CAPS, Take 1 capsule (2,000 Units total) by mouth daily., Disp: 30 capsule, Rfl: 0   ferrous sulfate 325 (65 FE) MG tablet, Take 325 mg by mouth 2 (two) times daily with a meal. , Disp: , Rfl:    furosemide (LASIX) 40 MG tablet, Take 1 tablet (40 mg total) by mouth daily as needed., Disp: 90 tablet, Rfl: 0   Glucosamine Sulfate 500 MG TABS, Take 1 tablet by mouth once a week. , Disp: , Rfl:    loratadine (CLARITIN) 10 MG tablet, Take 1 tablet (10 mg total) by mouth daily., Disp: 90 tablet, Rfl: 1   losartan  (COZAAR) 50 MG tablet, TAKE 1 TABLET BY MOUTH EVERY DAY, Disp: 90 tablet, Rfl: 1   metFORMIN (GLUCOPHAGE) 500 MG tablet, TAKE 1 TABLET (500 MG TOTAL) BY MOUTH 2 (TWO) TIMES DAILY WITH A MEAL. IN PLACE OF 1000 MG DOSE, Disp: 180 tablet, Rfl: 1   montelukast (SINGULAIR) 10 MG tablet, TAKE 1 TABLET BY MOUTH EVERYDAY AT BEDTIME, Disp: 90 tablet, Rfl: 1   Multiple Vitamin (MULTI-VITAMINS) TABS, Take by mouth., Disp: , Rfl:    omeprazole (PRILOSEC) 20 MG capsule, Take 1 capsule (20 mg total) by mouth 2 (two) times daily before a meal., Disp: 180 capsule, Rfl: 0   Potassium Chloride ER 20 MEQ TBCR, Take 10 mEq by mouth daily as needed. With lasix, Disp: , Rfl:  repaglinide (PRANDIN) 2 MG tablet, TAKE 1 TABLET (2 MG TOTAL) BY MOUTH DAILY BEFORE SUPPER., Disp: 90 tablet, Rfl: 1   vitamin B-12 (CYANOCOBALAMIN) 1000 MCG tablet, Take 1,000 mcg by mouth 3 (three) times a week., Disp: , Rfl:    vitamin C (ASCORBIC ACID) 500 MG tablet, Take 500 mg by mouth daily., Disp: , Rfl:    vitamin E 180 MG (400 UNITS) capsule, Take 400 Units by mouth daily., Disp: , Rfl:    Zinc Acetate 50 MG CAPS, Take by mouth daily. , Disp: , Rfl:   Allergies  Allergen Reactions   Lisinopril     I personally reviewed active problem list, medication list, allergies, family history, social history, health maintenance with the patient/caregiver today.   ROS  Constitutional: Negative for fever , positive for mild  weight change.  Respiratory: Negative for cough and shortness of breath.   Cardiovascular: Negative for chest pain or palpitations.  Gastrointestinal: Negative for abdominal pain, no bowel changes.  Musculoskeletal: Negative for gait problem or joint swelling.  Skin: Negative for rash.  Neurological: Negative for dizziness or headache.  No other specific complaints in a complete review of systems (except as listed in HPI above).   Objective  Vitals:   07/09/21 1053  BP: 126/62  Pulse: 90  Resp: 16  SpO2: 99%   Weight: 167 lb (75.8 kg)  Height: '5\' 5"'$  (1.651 m)    Body mass index is 27.79 kg/m.  Physical Exam  Constitutional: Patient appears well-developed and temporal waisting    No distress.  HEENT: head atraumatic, normocephalic, pupils equal and reactive to light, neck supple Cardiovascular: Normal rate, regular rhythm and normal heart sounds.  No murmur heard. Trace  BLE edema. Pulmonary/Chest: Effort normal and breath sounds normal. No respiratory distress. Abdominal: Soft.  There is no tenderness. Psychiatric: Patient has a normal mood and affect. behavior is normal. Judgment and thought content normal.   Recent Results (from the past 2160 hour(s))  POCT HgB A1C     Status: Abnormal   Collection Time: 07/09/21 10:54 AM  Result Value Ref Range   Hemoglobin A1C 6.4 (A) 4.0 - 5.6 %   HbA1c POC (<> result, manual entry)     HbA1c, POC (prediabetic range)     HbA1c, POC (controlled diabetic range)        PHQ2/9:    07/09/2021   10:54 AM 04/20/2021    9:41 AM 03/08/2021   10:58 AM 11/10/2020   10:56 AM 07/14/2020   11:08 AM  Depression screen PHQ 2/9  Decreased Interest 0 0 0 0 0  Down, Depressed, Hopeless 0 0 0 0 0  PHQ - 2 Score 0 0 0 0 0  Altered sleeping 0  0 0   Tired, decreased energy 0  0 0   Change in appetite 0  0 0   Feeling bad or failure about yourself  0  0 0   Trouble concentrating 0  0 0   Moving slowly or fidgety/restless 0  0 0   Suicidal thoughts 0  0 0   PHQ-9 Score 0  0 0     phq 9 is negative   Fall Risk:    07/09/2021   10:54 AM 04/20/2021    9:45 AM 03/08/2021   10:58 AM 11/10/2020   10:56 AM 07/14/2020   11:07 AM  Fall Risk   Falls in the past year? 0 0 0 1 0  Number falls in past yr: 0  0 0 1 0  Injury with Fall? 0 0 0 0 0  Risk for fall due to : Impaired balance/gait No Fall Risks No Fall Risks Impaired balance/gait   Follow up Falls prevention discussed Falls prevention discussed Falls prevention discussed Falls prevention discussed        Functional Status Survey: Is the patient deaf or have difficulty hearing?: No Does the patient have difficulty seeing, even when wearing glasses/contacts?: No Does the patient have difficulty concentrating, remembering, or making decisions?: No Does the patient have difficulty walking or climbing stairs?: Yes Does the patient have difficulty dressing or bathing?: No Does the patient have difficulty doing errands alone such as visiting a doctor's office or shopping?: No    Assessment & Plan  1. Type 2 diabetes mellitus with microalbuminuria, without long-term current use of insulin (HCC)  - POCT HgB A1C  2. Essential (primary) hypertension   3. Primary osteoarthritis of right knee   4. Meningioma ([place])   5. Dyslipidemia associated with type 2 diabetes mellitus (HCC)  - atorvastatin (LIPITOR) 40 MG tablet; Take 1 tablet (40 mg total) by mouth daily.  Dispense: 90 tablet; Refill: 1  6. Type 2 diabetes mellitus with pressure callus (HCC)  - atorvastatin (LIPITOR) 40 MG tablet; Take 1 tablet (40 mg total) by mouth daily.  Dispense: 90 tablet; Refill: 1  7. Dyslipidemia  - atorvastatin (LIPITOR) 40 MG tablet; Take 1 tablet (40 mg total) by mouth daily.  Dispense: 90 tablet; Refill: 1

## 2021-07-09 ENCOUNTER — Ambulatory Visit: Payer: Medicare HMO | Admitting: Family Medicine

## 2021-07-09 ENCOUNTER — Encounter: Payer: Self-pay | Admitting: Family Medicine

## 2021-07-09 VITALS — BP 126/62 | HR 90 | Resp 16 | Ht 65.0 in | Wt 167.0 lb

## 2021-07-09 DIAGNOSIS — D329 Benign neoplasm of meninges, unspecified: Secondary | ICD-10-CM

## 2021-07-09 DIAGNOSIS — R809 Proteinuria, unspecified: Secondary | ICD-10-CM | POA: Diagnosis not present

## 2021-07-09 DIAGNOSIS — M1711 Unilateral primary osteoarthritis, right knee: Secondary | ICD-10-CM | POA: Diagnosis not present

## 2021-07-09 DIAGNOSIS — E11628 Type 2 diabetes mellitus with other skin complications: Secondary | ICD-10-CM

## 2021-07-09 DIAGNOSIS — E1169 Type 2 diabetes mellitus with other specified complication: Secondary | ICD-10-CM

## 2021-07-09 DIAGNOSIS — E1129 Type 2 diabetes mellitus with other diabetic kidney complication: Secondary | ICD-10-CM | POA: Diagnosis not present

## 2021-07-09 DIAGNOSIS — L84 Corns and callosities: Secondary | ICD-10-CM

## 2021-07-09 DIAGNOSIS — I10 Essential (primary) hypertension: Secondary | ICD-10-CM

## 2021-07-09 DIAGNOSIS — E785 Hyperlipidemia, unspecified: Secondary | ICD-10-CM

## 2021-07-09 LAB — POCT GLYCOSYLATED HEMOGLOBIN (HGB A1C): Hemoglobin A1C: 6.4 % — AB (ref 4.0–5.6)

## 2021-07-09 MED ORDER — ATORVASTATIN CALCIUM 40 MG PO TABS: 40.0000 mg | ORAL_TABLET | Freq: Every day | ORAL | 1 refills | Status: DC

## 2021-07-22 ENCOUNTER — Encounter: Payer: Self-pay | Admitting: Family Medicine

## 2021-08-05 ENCOUNTER — Other Ambulatory Visit: Payer: Self-pay | Admitting: Family Medicine

## 2021-08-05 DIAGNOSIS — J3089 Other allergic rhinitis: Secondary | ICD-10-CM

## 2021-10-30 ENCOUNTER — Other Ambulatory Visit: Payer: Self-pay | Admitting: Family Medicine

## 2021-10-30 DIAGNOSIS — E113553 Type 2 diabetes mellitus with stable proliferative diabetic retinopathy, bilateral: Secondary | ICD-10-CM

## 2021-10-30 DIAGNOSIS — E1169 Type 2 diabetes mellitus with other specified complication: Secondary | ICD-10-CM

## 2021-10-30 DIAGNOSIS — E11628 Type 2 diabetes mellitus with other skin complications: Secondary | ICD-10-CM

## 2021-11-09 NOTE — Progress Notes (Unsigned)
Name: Beverly [name]   MRN: [id number]    DOB: 16-Jan-1934   Date:11/10/2021       Progress Note  Subjective  Chief Complaint  Follow Up  HPI  DMII: she is now taking Metformin to 500  mg daily,  taking Prandin two  times a day before her meals, today A1C is down from 6.8 % to 6.3 % 6.4 % down 6.2 % it went up to 7.5 5 when we went down on dose of Metformin  last visit it was  A1C 6.4 % and today is 6.8 %  Fasting low 100, to around 150's post prandially.  Eye exam is up to date., she has proliferative diabetic retinopathy and also macular edema of right eye, sees podiatrist for callus, she also has microalbuminuria , she is on ARB. She denies polyphagia or polydipsia or polyuria    Meningioma: History of syncopal episode: in 2020 seen by cardiologist, Dr. Caryl Comes, echo showed normal EF. Possibly from change in bp medication because of shortage. She has not had any more episodes Had sub-dural hematoma, she had a repeat MRI done 08/2019 and showed a very mild increase in size of meningioma, repeat MRI done 11/22 but Dr. Lacinda Axon left and she is now seeing Dr. Cari Caraway and since very mild change no longer repeating imaging but to notify him if any symptoms such as headaches, double vision or balance problems . She continues to be asymptomatic and will let us know if she needs to go back to see him   IMPRESSION - MRI brain done 11/22 1. Minimal enlargement of a left paracentral planum sphenoidale meningioma since 2020, now up to 2.9 cm. Regional mass effect is stable. Mild vasogenic edema in the left inferior frontal gyrus has increased since 2020 but is stable from last year.   2. No new intracranial abnormality. 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   HTN:  BP is at goal for her today, she states her bp is 120's/130's at home, denies orthostatic changes , no headaches    Hyperlipidemia: taking Atorvastatin, no myalgias. Last LDL  59 done 11/2020, we will recheck it today    AR: well controlled, taking medication prn, no rhinorrhea or nasal congestion at this time.    OA:  it started after a fall 35 plus years ago secondary to a fall, initially right knee, but now seems to be worse on the left side.  Pain is sharp on the left knee, and feels weak on left leg. She is still seeing chiropractor - Dr. Freddi Che  Unintentional weight loss/Protein malnutrition : she had lost 18 lbs from 2018 to 2019 .  She states she was under a lot of stress since Summer of 2018 worried about grand-daughter  also her daughter was diagnosed with uterine cancer. Since she lost weight, she got motivated to eat healthier, she fell  Summer 2020 and broke her dentures during a syncopal episode.  Weight was stable in the 170 lbs however it went down to 167 lbs and she has been making an effort of eating more and gained a few pounds since last visit   Osteopenia: taking alendronate, last vitamin D was good , discussed high calcium diet . Advised to stop medication since taking it for over 5 years, and repeat bone density   Patient Active Problem List   Diagnosis Date Noted   B12 deficiency 11/10/2021   Mild protein-calorie malnutrition ([place]) 11/10/2021  History of subdural hematoma 11/10/2021   Meningioma ([place]) 11/10/2020   Iron deficiency anemia    Subdural hematoma (HCC)    Syncope and collapse 07/24/2018   Type 2 diabetes mellitus with stable proliferative retinopathy of both eyes, without long-term current use of insulin ([place]) 02/27/2018   Type 2 diabetes mellitus with pressure callus ([place]) 10/24/2017   Diabetic retinopathy ([place]) 11/12/2015   Osteoarthritis 06/02/2015   Osteopenia after menopause 06/02/2015   Narrowing of intervertebral disc space 06/02/2015   Dyslipidemia associated with type 2 diabetes mellitus ([place]) 06/02/2015   Perennial allergic rhinitis 02/28/2007   Essential (primary) hypertension 06/26/2006   Pure  hypercholesterolemia 06/26/2006    Past Surgical History:  Procedure Laterality Date   ABDOMINAL HYSTERECTOMY     BUNIONECTOMY     COLONOSCOPY     COLONOSCOPY WITH PROPOFOL N/A 09/24/2018   Procedure: COLONOSCOPY WITH PROPOFOL;  Surgeon: Lin Landsman, MD;  Location: [facility] ENDOSCOPY;  Service: Gastroenterology;  Laterality: N/A;   ESOPHAGOGASTRODUODENOSCOPY (EGD) WITH PROPOFOL N/A 09/24/2018   Procedure: ESOPHAGOGASTRODUODENOSCOPY (EGD) WITH PROPOFOL;  Surgeon: Lin Landsman, MD;  Location: [place];  Service: Gastroenterology;  Laterality: N/A;   GIVENS CAPSULE STUDY N/A 10/11/2018   Procedure: GIVENS CAPSULE STUDY;  Surgeon: Lin Landsman, MD;  [facility] ENDOSCOPY;  Service: Gastroenterology;  Laterality: N/A;   HERNIA REPAIR      Family History  Problem Relation Age of Onset   Hypertension Mother    Healthy Father    Healthy Daughter    Raynaud syndrome Daughter    Hypertension Daughter    Stroke Sister     Social History   Tobacco Use   Smoking status: Never   Smokeless tobacco: Never   Tobacco comments:    smoking cessation materials not required  Substance Use Topics   Alcohol use: No    Alcohol/week: 0.0 standard drinks of alcohol     Current Outpatient Medications:    acetaminophen (TYLENOL) 650 MG CR tablet, Take 1 tablet by mouth every 6 (six) hours as needed., Disp: , Rfl:    atorvastatin (LIPITOR) 40 MG tablet, Take 1 tablet (40 mg total) by mouth daily., Disp: 90 tablet, Rfl: 1   Cholecalciferol (VITAMIN D) 2000 units CAPS, Take 1 capsule (2,000 Units total) by mouth daily., Disp: 30 capsule, Rfl: 0   ferrous sulfate 325 (65 FE) MG tablet, Take 325 mg by mouth 2 (two) times daily with a meal. , Disp: , Rfl:    furosemide (LASIX) 40 MG tablet, Take 1 tablet (40 mg total) by mouth daily as needed., Disp: 90 tablet, Rfl: 0   Glucosamine Sulfate 500 MG TABS, Take 1 tablet by mouth once a week. , Disp: , Rfl:    loratadine (CLARITIN) 10  MG tablet, Take 1 tablet (10 mg total) by mouth daily., Disp: 90 tablet, Rfl: 1   montelukast (SINGULAIR) 10 MG tablet, TAKE 1 TABLET BY MOUTH EVERYDAY AT BEDTIME, Disp: 90 tablet, Rfl: 1   Multiple Vitamin (MULTI-VITAMINS) TABS, Take by mouth., Disp: , Rfl:    Potassium Chloride ER 20 MEQ TBCR, Take 10 mEq by mouth daily as needed. With lasix, Disp: , Rfl:    vitamin B-12 (CYANOCOBALAMIN) 1000 MCG tablet, Take 1,000 mcg by mouth 3 (three) times a week., Disp: , Rfl:    vitamin C (ASCORBIC ACID) 500 MG tablet, Take 500 mg by mouth daily., Disp: , Rfl:    vitamin E 180 MG (400 UNITS) capsule, Take 400 Units by mouth  daily., Disp: , Rfl:    Zinc Acetate 50 MG CAPS, Take by mouth daily. , Disp: , Rfl:    losartan (COZAAR) 50 MG tablet, Take 1 tablet (50 mg total) by mouth daily., Disp: 90 tablet, Rfl: 1   metFORMIN (GLUCOPHAGE) 500 MG tablet, Take 1 tablet (500 mg total) by mouth 2 (two) times daily with a meal., Disp: 180 tablet, Rfl: 1   repaglinide (PRANDIN) 2 MG tablet, Take 1 tablet (2 mg total) by mouth daily before supper., Disp: 90 tablet, Rfl: 1  Allergies  Allergen Reactions   Lisinopril     I personally reviewed active problem list, medication list, allergies, family history, social history, health maintenance with the patient/caregiver today.   ROS  Constitutional: Negative for fever or significant weight change.  Respiratory: Negative for cough and shortness of breath.   Cardiovascular: Negative for chest pain or palpitations.  Gastrointestinal: Negative for abdominal pain, no bowel changes.  Musculoskeletal: Negative for gait problem or joint swelling.  Skin: Negative for rash.  Neurological: Negative for dizziness or headache.  No other specific complaints in a complete review of systems (except as listed in HPI above).   Objective  Vitals:   11/10/21 1035  BP: 132/68  Pulse: 98  Resp: 16  SpO2: 99%  Weight: 173 lb (78.5 kg)  Height: '5\' 5"'$  (1.651 m)    Body mass  index is 28.79 kg/m.  Physical Exam  Constitutional: Patient appears well-developed and malnourished with some temporal waisting   No distress.  HEENT: head atraumatic, normocephalic, pupils equal and reactive to light, neck supple, throat within normal limits Cardiovascular: Normal rate, regular rhythm and normal heart sounds.  No murmur heard. No BLE edema. Pulmonary/Chest: Effort normal and breath sounds normal. No respiratory distress. Abdominal: Soft.  There is no tenderness. Psychiatric: Patient has a normal mood and affect. behavior is normal. Judgment and thought content normal.   Recent Results (from the past 2160 hour(s))  POCT HgB A1C     Status: Abnormal   Collection Time: 11/10/21 10:38 AM  Result Value Ref Range   Hemoglobin A1C 6.8 (A) 4.0 - 5.6 %   HbA1c POC (<> result, manual entry)     HbA1c, POC (prediabetic range)     HbA1c, POC (controlled diabetic range)      Diabetic Foot Exam: Diabetic Foot Exam - Simple   Simple Foot Form Visual Inspection See comments: Yes Sensation Testing Intact to touch and monofilament testing bilaterally: Yes Pulse Check Posterior Tibialis and Dorsalis pulse intact bilaterally: Yes Comments Callus, dry skin       PHQ2/9:    11/10/2021   10:36 AM 07/09/2021   10:54 AM 04/20/2021    9:41 AM 03/08/2021   10:58 AM 11/10/2020   10:56 AM  Depression screen PHQ 2/9  Decreased Interest 0 0 0 0 0  Down, Depressed, Hopeless 0 0 0 0 0  PHQ - 2 Score 0 0 0 0 0  Altered sleeping 0 0  0 0  Tired, decreased energy 0 0  0 0  Change in appetite 0 0  0 0  Feeling bad or failure about yourself  0 0  0 0  Trouble concentrating 0 0  0 0  Moving slowly or fidgety/restless 0 0  0 0  Suicidal thoughts 0 0  0 0  PHQ-9 Score 0 0  0 0    phq 9 is negative   Fall Risk:    11/10/2021   10:36 AM  07/09/2021   10:54 AM 04/20/2021    9:45 AM 03/08/2021   10:58 AM 11/10/2020   10:56 AM  Fall Risk   Falls in the past year? 0 0 0 0 1  Number falls  in past yr: 0 0 0 0 1  Injury with Fall? 0 0 0 0 0  Risk for fall due to : No Fall Risks Impaired balance/gait No Fall Risks No Fall Risks Impaired balance/gait  Follow up Falls prevention discussed Falls prevention discussed Falls prevention discussed Falls prevention discussed Falls prevention discussed      Functional Status Survey: Is the patient deaf or have difficulty hearing?: No Does the patient have difficulty seeing, even when wearing glasses/contacts?: No Does the patient have difficulty concentrating, remembering, or making decisions?: No Does the patient have difficulty walking or climbing stairs?: Yes Does the patient have difficulty dressing or bathing?: No Does the patient have difficulty doing errands alone such as visiting a doctor's office or shopping?: No    Assessment & Plan  1. Type 2 diabetes mellitus with microalbuminuria, without long-term current use of insulin (HCC)  - HM Diabetes Foot Exam - POCT HgB A1C  2. Meningioma [facility])  Released from neurosurgeon for now  3. Dyslipidemia associated with type 2 diabetes mellitus ([place])  On statin therapy   4. Essential (primary) hypertension  At goal   5. B12 deficiency  Taking supplementation  6. Osteopenia after menopause  - DG Bone Density; Future

## 2021-11-10 ENCOUNTER — Encounter: Payer: Self-pay | Admitting: Family Medicine

## 2021-11-10 ENCOUNTER — Ambulatory Visit (INDEPENDENT_AMBULATORY_CARE_PROVIDER_SITE_OTHER): Payer: Medicare HMO | Admitting: Family Medicine

## 2021-11-10 VITALS — BP 132/68 | HR 98 | Resp 16 | Ht 65.0 in | Wt 173.0 lb

## 2021-11-10 DIAGNOSIS — E11628 Type 2 diabetes mellitus with other skin complications: Secondary | ICD-10-CM

## 2021-11-10 DIAGNOSIS — Z780 Asymptomatic menopausal state: Secondary | ICD-10-CM

## 2021-11-10 DIAGNOSIS — Z23 Encounter for immunization: Secondary | ICD-10-CM

## 2021-11-10 DIAGNOSIS — D329 Benign neoplasm of meninges, unspecified: Secondary | ICD-10-CM | POA: Diagnosis not present

## 2021-11-10 DIAGNOSIS — L84 Corns and callosities: Secondary | ICD-10-CM

## 2021-11-10 DIAGNOSIS — E441 Mild protein-calorie malnutrition: Secondary | ICD-10-CM | POA: Diagnosis not present

## 2021-11-10 DIAGNOSIS — Z8679 Personal history of other diseases of the circulatory system: Secondary | ICD-10-CM | POA: Insufficient documentation

## 2021-11-10 DIAGNOSIS — E1129 Type 2 diabetes mellitus with other diabetic kidney complication: Secondary | ICD-10-CM

## 2021-11-10 DIAGNOSIS — R809 Proteinuria, unspecified: Secondary | ICD-10-CM | POA: Diagnosis not present

## 2021-11-10 DIAGNOSIS — E1169 Type 2 diabetes mellitus with other specified complication: Secondary | ICD-10-CM | POA: Diagnosis not present

## 2021-11-10 DIAGNOSIS — E538 Deficiency of other specified B group vitamins: Secondary | ICD-10-CM

## 2021-11-10 DIAGNOSIS — E785 Hyperlipidemia, unspecified: Secondary | ICD-10-CM

## 2021-11-10 DIAGNOSIS — M8580 Other specified disorders of bone density and structure, unspecified site: Secondary | ICD-10-CM

## 2021-11-10 DIAGNOSIS — I10 Essential (primary) hypertension: Secondary | ICD-10-CM

## 2021-11-10 LAB — POCT GLYCOSYLATED HEMOGLOBIN (HGB A1C): Hemoglobin A1C: 6.8 % — AB (ref 4.0–5.6)

## 2021-11-10 MED ORDER — REPAGLINIDE 2 MG PO TABS: 2.0000 mg | ORAL_TABLET | Freq: Every day | ORAL | 1 refills | Status: DC

## 2021-11-10 MED ORDER — LOSARTAN POTASSIUM 50 MG PO TABS
50.0000 mg | ORAL_TABLET | Freq: Every day | ORAL | 1 refills | Status: DC
Start: 2021-11-10 — End: 2022-03-14

## 2021-11-10 MED ORDER — METFORMIN HCL 500 MG PO TABS: 500.0000 mg | ORAL_TABLET | Freq: Two times a day (BID) | ORAL | 1 refills | Status: DC

## 2021-12-19 ENCOUNTER — Other Ambulatory Visit: Payer: Self-pay | Admitting: Family Medicine

## 2021-12-19 DIAGNOSIS — M8580 Other specified disorders of bone density and structure, unspecified site: Secondary | ICD-10-CM

## 2022-01-28 ENCOUNTER — Other Ambulatory Visit: Payer: Self-pay | Admitting: Family Medicine

## 2022-01-28 DIAGNOSIS — J3089 Other allergic rhinitis: Secondary | ICD-10-CM

## 2022-01-31 ENCOUNTER — Other Ambulatory Visit: Payer: Self-pay | Admitting: Family Medicine

## 2022-01-31 DIAGNOSIS — E1169 Type 2 diabetes mellitus with other specified complication: Secondary | ICD-10-CM

## 2022-01-31 DIAGNOSIS — E785 Hyperlipidemia, unspecified: Secondary | ICD-10-CM

## 2022-01-31 DIAGNOSIS — E11628 Type 2 diabetes mellitus with other skin complications: Secondary | ICD-10-CM

## 2022-02-03 LAB — HM DIABETES EYE EXAM

## 2022-03-11 NOTE — Progress Notes (Unsigned)
Name: Beverly [name]   MRN: [id number]    DOB: Nov 30, 1934   Date:03/11/2022       Progress Note  Subjective  Chief Complaint  Follow Up  HPI  DMII: she is now taking Metformin to 500  mg daily,  taking Prandin two  times a day before her meals, today A1C is down from 6.8 % to 6.3 % 6.4 % down 6.2 % it went up to 7.5 5 when we went down on dose of Metformin  last visit it was  A1C 6.4 % and today is 6.8 %  Fasting low 100, to around 150's post prandially.  Eye exam is up to date., she has proliferative diabetic retinopathy and also macular edema of right eye, sees podiatrist for callus, she also has microalbuminuria , she is on ARB. She denies polyphagia or polydipsia or polyuria    Meningioma: History of syncopal episode: in 2020 seen by cardiologist, Dr. Caryl Comes, echo showed normal EF. Possibly from change in bp medication because of shortage. She has not had any more episodes Had sub-dural hematoma, she had a repeat MRI done 08/2019 and showed a very mild increase in size of meningioma, repeat MRI done 11/22 but Dr. Lacinda Axon left and she is now seeing Dr. Cari Caraway and since very mild change no longer repeating imaging but to notify him if any symptoms such as headaches, double vision or balance problems . She continues to be asymptomatic and will let us know if she needs to go back to see him   IMPRESSION - MRI brain done 11/22 1. Minimal enlargement of a left paracentral planum sphenoidale meningioma since 2020, now up to 2.9 cm. Regional mass effect is stable. Mild vasogenic edema in the left inferior frontal gyrus has increased since 2020 but is stable from last year.   2. No new intracranial abnormality. 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   HTN:  BP is at goal for her today, she states her bp is 120's/130's at home, denies orthostatic changes , no headaches    Hyperlipidemia: taking Atorvastatin, no myalgias. Last LDL  59 done 11/2020, we will recheck it today    AR: well controlled, taking medication prn, no rhinorrhea or nasal congestion at this time.    OA:  it started after a fall 35 plus years ago secondary to a fall, initially right knee, but now seems to be worse on the left side.  Pain is sharp on the left knee, and feels weak on left leg. She is still seeing chiropractor - Dr. Freddi Che  Unintentional weight loss/Protein malnutrition : she had lost 18 lbs from 2018 to 2019 .  She states she was under a lot of stress since Summer of 2018 worried about grand-daughter  also her daughter was diagnosed with uterine cancer. Since she lost weight, she got motivated to eat healthier, she fell  Summer 2020 and broke her dentures during a syncopal episode.  Weight was stable in the 170 lbs however it went down to 167 lbs and she has been making an effort of eating more and gained a few pounds since last visit   Osteopenia: taking alendronate, last vitamin D was good , discussed high calcium diet . Advised to stop medication since taking it for over 5 years, and repeat bone density   Patient Active Problem List   Diagnosis Date Noted   B12 deficiency 11/10/2021   Mild protein-calorie malnutrition ([place]) 11/10/2021  History of subdural hematoma 11/10/2021   Meningioma ([place]) 11/10/2020   Iron deficiency anemia    Subdural hematoma ([facility])    Syncope and collapse 07/24/2018   Type 2 diabetes mellitus with stable proliferative retinopathy of both eyes, without long-term current use of insulin ([place]) 02/27/2018   Type 2 diabetes mellitus with pressure callus ([place]) 10/24/2017   Diabetic retinopathy ([place]) 11/12/2015   Osteoarthritis 06/02/2015   Osteopenia after menopause 06/02/2015   Narrowing of intervertebral disc space 06/02/2015   Dyslipidemia associated with type 2 diabetes mellitus ([place]) 06/02/2015   Perennial allergic rhinitis 02/28/2007   Essential (primary) hypertension 06/26/2006   Pure  hypercholesterolemia 06/26/2006    Past Surgical History:  Procedure Laterality Date   ABDOMINAL HYSTERECTOMY     BUNIONECTOMY     COLONOSCOPY     COLONOSCOPY WITH PROPOFOL N/A 09/24/2018   Procedure: COLONOSCOPY WITH PROPOFOL;  Surgeon: Lin Landsman, MD;  Location: [facility] ENDOSCOPY;  Service: Gastroenterology;  Laterality: N/A;   ESOPHAGOGASTRODUODENOSCOPY (EGD) WITH PROPOFOL N/A 09/24/2018   Procedure: ESOPHAGOGASTRODUODENOSCOPY (EGD) WITH PROPOFOL;  Surgeon: Lin Landsman, MD;  Location: [facility];  Service: Gastroenterology;  Laterality: N/A;   GIVENS CAPSULE STUDY N/A 10/11/2018   Procedure: GIVENS CAPSULE STUDY;  Surgeon: Lin Landsman, MD;  Location: [facility] ENDOSCOPY;  Service: Gastroenterology;  Laterality: N/A;   HERNIA REPAIR      Family History  Problem Relation Age of Onset   Hypertension Mother    Healthy Father    Healthy Daughter    Raynaud syndrome Daughter    Hypertension Daughter    Stroke Sister     Social History   Tobacco Use   Smoking status: Never   Smokeless tobacco: Never   Tobacco comments:    smoking cessation materials not required  Substance Use Topics   Alcohol use: No    Alcohol/week: 0.0 standard drinks of alcohol     Current Outpatient Medications:    acetaminophen (TYLENOL) 650 MG CR tablet, Take 1 tablet by mouth every 6 (six) hours as needed., Disp: , Rfl:    atorvastatin (LIPITOR) 40 MG tablet, TAKE 1 TABLET BY MOUTH EVERY DAY, Disp: 90 tablet, Rfl: 0   Cholecalciferol (VITAMIN D) 2000 units CAPS, Take 1 capsule (2,000 Units total) by mouth daily., Disp: 30 capsule, Rfl: 0   ferrous sulfate 325 (65 FE) MG tablet, Take 325 mg by mouth 2 (two) times daily with a meal. , Disp: , Rfl:    furosemide (LASIX) 40 MG tablet, Take 1 tablet (40 mg total) by mouth daily as needed., Disp: 90 tablet, Rfl: 0   Glucosamine Sulfate 500 MG TABS, Take 1 tablet by mouth once a week. , Disp: , Rfl:    loratadine (CLARITIN) 10 MG tablet,  Take 1 tablet (10 mg total) by mouth daily., Disp: 90 tablet, Rfl: 1   losartan (COZAAR) 50 MG tablet, Take 1 tablet (50 mg total) by mouth daily., Disp: 90 tablet, Rfl: 1   metFORMIN (GLUCOPHAGE) 500 MG tablet, Take 1 tablet (500 mg total) by mouth 2 (two) times daily with a meal., Disp: 180 tablet, Rfl: 1   montelukast (SINGULAIR) 10 MG tablet, TAKE 1 TABLET BY MOUTH EVERYDAY AT BEDTIME, Disp: 90 tablet, Rfl: 0   Multiple Vitamin (MULTI-VITAMINS) TABS, Take by mouth., Disp: , Rfl:    Potassium Chloride ER 20 MEQ TBCR, Take 10 mEq by mouth daily as needed. With lasix, Disp: , Rfl:    repaglinide (PRANDIN) 2 MG tablet, Take 1  tablet (2 mg total) by mouth daily before supper., Disp: 90 tablet, Rfl: 1   vitamin B-12 (CYANOCOBALAMIN) 1000 MCG tablet, Take 1,000 mcg by mouth 3 (three) times a week., Disp: , Rfl:    vitamin C (ASCORBIC ACID) 500 MG tablet, Take 500 mg by mouth daily., Disp: , Rfl:    vitamin E 180 MG (400 UNITS) capsule, Take 400 Units by mouth daily., Disp: , Rfl:    Zinc Acetate 50 MG CAPS, Take by mouth daily. , Disp: , Rfl:   Allergies  Allergen Reactions   Lisinopril     I personally reviewed active problem list, medication list, allergies, family history, social history, health maintenance with the patient/caregiver today.   ROS  ***  Objective  There were no vitals filed for this visit.  There is no height or weight on file to calculate BMI.  Physical Exam ***  Recent Results (from the past 2160 hour(s))  HM DIABETES EYE EXAM     Status: Abnormal   Collection Time: 02/03/22 12:00 AM  Result Value Ref Range   HM Diabetic Eye Exam Retinopathy (A) No Retinopathy    PHQ2/9:    11/10/2021   10:36 AM 07/09/2021   10:54 AM 04/20/2021    9:41 AM 03/08/2021   10:58 AM 11/10/2020   10:56 AM  Depression screen PHQ 2/9  Decreased Interest 0 0 0 0 0  Down, Depressed, Hopeless 0 0 0 0 0  PHQ - 2 Score 0 0 0 0 0  Altered sleeping 0 0  0 0  Tired, decreased energy 0 0   0 0  Change in appetite 0 0  0 0  Feeling bad or failure about yourself  0 0  0 0  Trouble concentrating 0 0  0 0  Moving slowly or fidgety/restless 0 0  0 0  Suicidal thoughts 0 0  0 0  PHQ-9 Score 0 0  0 0    phq 9 is {gen pos [id number]   Fall Risk:    11/10/2021   10:36 AM 07/09/2021   10:54 AM 04/20/2021    9:45 AM 03/08/2021   10:58 AM 11/10/2020   10:56 AM  Fall Risk   Falls in the past year? 0 0 0 0 1  Number falls in past yr: 0 0 0 0 1  Injury with Fall? 0 0 0 0 0  Risk for fall due to : No Fall Risks Impaired balance/gait No Fall Risks No Fall Risks Impaired balance/gait  Follow up Falls prevention discussed Falls prevention discussed Falls prevention discussed Falls prevention discussed Falls prevention discussed      Functional Status Survey:      Assessment & Plan  *** There are no diagnoses linked to this encounter.

## 2022-03-14 ENCOUNTER — Ambulatory Visit: Payer: Medicare HMO | Admitting: Family Medicine

## 2022-03-14 ENCOUNTER — Encounter: Payer: Self-pay | Admitting: Family Medicine

## 2022-03-14 ENCOUNTER — Ambulatory Visit
Admission: RE | Admit: 2022-03-14 | Discharge: 2022-03-14 | Disposition: A | Discharge status: Home / Self Care | Payer: Medicare HMO | Source: Ambulatory Visit | Attending: Family Medicine | Admitting: Family Medicine

## 2022-03-14 VITALS — BP 126/66 | HR 91 | Temp 97.7°F | Resp 18 | Ht 65.0 in | Wt 174.3 lb

## 2022-03-14 DIAGNOSIS — D329 Benign neoplasm of meninges, unspecified: Secondary | ICD-10-CM | POA: Diagnosis not present

## 2022-03-14 DIAGNOSIS — E113553 Type 2 diabetes mellitus with stable proliferative diabetic retinopathy, bilateral: Secondary | ICD-10-CM

## 2022-03-14 DIAGNOSIS — E11628 Type 2 diabetes mellitus with other skin complications: Secondary | ICD-10-CM

## 2022-03-14 DIAGNOSIS — E441 Mild protein-calorie malnutrition: Secondary | ICD-10-CM

## 2022-03-14 DIAGNOSIS — E1169 Type 2 diabetes mellitus with other specified complication: Secondary | ICD-10-CM | POA: Diagnosis not present

## 2022-03-14 DIAGNOSIS — R809 Proteinuria, unspecified: Secondary | ICD-10-CM | POA: Diagnosis not present

## 2022-03-14 DIAGNOSIS — Z780 Asymptomatic menopausal state: Secondary | ICD-10-CM

## 2022-03-14 DIAGNOSIS — J3089 Other allergic rhinitis: Secondary | ICD-10-CM

## 2022-03-14 DIAGNOSIS — M8580 Other specified disorders of bone density and structure, unspecified site: Secondary | ICD-10-CM

## 2022-03-14 DIAGNOSIS — E1129 Type 2 diabetes mellitus with other diabetic kidney complication: Secondary | ICD-10-CM

## 2022-03-14 DIAGNOSIS — E538 Deficiency of other specified B group vitamins: Secondary | ICD-10-CM

## 2022-03-14 DIAGNOSIS — L84 Corns and callosities: Secondary | ICD-10-CM

## 2022-03-14 DIAGNOSIS — Z862 Personal history of diseases of the blood and blood-forming organs and certain disorders involving the immune mechanism: Secondary | ICD-10-CM

## 2022-03-14 DIAGNOSIS — E785 Hyperlipidemia, unspecified: Secondary | ICD-10-CM

## 2022-03-14 DIAGNOSIS — I10 Essential (primary) hypertension: Secondary | ICD-10-CM

## 2022-03-14 DIAGNOSIS — M1711 Unilateral primary osteoarthritis, right knee: Secondary | ICD-10-CM

## 2022-03-14 DIAGNOSIS — Z8679 Personal history of other diseases of the circulatory system: Secondary | ICD-10-CM

## 2022-03-14 LAB — POCT GLYCOSYLATED HEMOGLOBIN (HGB A1C): Hemoglobin A1C: 7.1 % — AB (ref 4.0–5.6)

## 2022-03-14 MED ORDER — METFORMIN HCL 500 MG PO TABS: 500.0000 mg | ORAL_TABLET | Freq: Two times a day (BID) | ORAL | 1 refills | Status: DC

## 2022-03-14 MED ORDER — LOSARTAN POTASSIUM 50 MG PO TABS: 50.0000 mg | ORAL_TABLET | Freq: Every day | ORAL | 1 refills | Status: DC

## 2022-03-14 MED ORDER — MONTELUKAST SODIUM 10 MG PO TABS: 10.0000 mg | ORAL_TABLET | Freq: Every day | ORAL | 1 refills | Status: DC

## 2022-03-14 MED ORDER — REPAGLINIDE 2 MG PO TABS: 2.0000 mg | ORAL_TABLET | Freq: Two times a day (BID) | ORAL | 1 refills | Status: DC

## 2022-03-15 LAB — COMPLETE METABOLIC PANEL WITH GFR
AG Ratio: 1.4 (calc) (ref 1.0–2.5)
ALT: 10 U/L (ref 6–29)
AST: 17 U/L (ref 10–35)
Albumin: 3.9 g/dL (ref 3.6–5.1)
Alkaline phosphatase (APISO): 61 U/L (ref 37–153)
BUN: 14 mg/dL (ref 7–25)
CO2: 28 mmol/L (ref 20–32)
Calcium: 8.9 mg/dL (ref 8.6–10.4)
Chloride: 106 mmol/L (ref 98–110)
Creat: 0.88 mg/dL (ref 0.60–0.95)
Globulin: 2.8 g/dL (calc) (ref 1.9–3.7)
Glucose, Bld: 183 mg/dL — ABNORMAL HIGH (ref 65–99)
Potassium: 4.3 mmol/L (ref 3.5–5.3)
Sodium: 143 mmol/L (ref 135–146)
Total Bilirubin: 0.4 mg/dL (ref 0.2–1.2)
Total Protein: 6.7 g/dL (ref 6.1–8.1)
eGFR: 64 mL/min/{1.73_m2} (ref >=60)

## 2022-03-15 LAB — CBC WITH DIFFERENTIAL/PLATELET
Absolute Monocytes: 334 cells/uL (ref 200–950)
Basophils Absolute: 32 cells/uL (ref 0–200)
Basophils Relative: 0.5 %
Eosinophils Absolute: 82 cells/uL (ref 15–500)
Eosinophils Relative: 1.3 %
HCT: 34.5 % — ABNORMAL LOW (ref 35.0–45.0)
Hemoglobin: 11.4 g/dL — ABNORMAL LOW (ref 11.7–15.5)
Lymphs Abs: 3830 cells/uL (ref 850–3900)
MCH: 31.1 pg (ref 27.0–33.0)
MCHC: 33 g/dL (ref 32.0–36.0)
MCV: 94.3 fL (ref 80.0–100.0)
MPV: 12.3 fL (ref 7.5–12.5)
Monocytes Relative: 5.3 %
Neutro Abs: 2022 cells/uL (ref 1500–7800)
Neutrophils Relative %: 32.1 %
Platelets: 203 10*3/uL (ref 140–400)
RBC: 3.66 10*6/uL — ABNORMAL LOW (ref 3.80–5.10)
RDW: 13 % (ref 11.0–15.0)
Total Lymphocyte: 60.8 %
WBC: 6.3 10*3/uL (ref 3.8–10.8)

## 2022-03-15 LAB — LIPID PANEL
Cholesterol: 125 mg/dL (ref <200)
HDL: 65 mg/dL (ref >=50)
LDL Cholesterol (Calc): 45 mg/dL (calc)
Non-HDL Cholesterol (Calc): 60 mg/dL (calc) (ref <130)
Total CHOL/HDL Ratio: 1.9 (calc) (ref <5.0)
Triglycerides: 73 mg/dL (ref <150)

## 2022-03-15 LAB — MICROALBUMIN / CREATININE URINE RATIO
Creatinine, Urine: 194 mg/dL (ref 20–275)
Microalb Creat Ratio: 8 mcg/mg creat (ref <30)
Microalb, Ur: 1.6 mg/dL

## 2022-03-15 LAB — VITAMIN B12: Vitamin B-12: 1338 pg/mL — ABNORMAL HIGH (ref 200–1100)

## 2022-03-15 LAB — VITAMIN D 25 HYDROXY (VIT D DEFICIENCY, FRACTURES): Vit D, 25-Hydroxy: 64 ng/mL (ref 30–100)

## 2022-03-23 ENCOUNTER — Telehealth: Payer: Self-pay | Admitting: Family Medicine

## 2022-03-23 NOTE — Telephone Encounter (Signed)
Contacted Beverly [name] to schedule their annual wellness visit. Appointment made for 04/28/2022.  [place] Direct Dial: [phone]

## 2022-03-31 ENCOUNTER — Telehealth: Payer: Self-pay | Admitting: Family Medicine

## 2022-03-31 NOTE — Telephone Encounter (Signed)
Chart notes faxed

## 2022-03-31 NOTE — Telephone Encounter (Signed)
Velva Harman calling from [employer] is calling to report need of Chart Notes for Diabetic Supplies. The notes that were faxed have missing pages.  [place] [phone] Fax- [phone]

## 2022-04-08 NOTE — Telephone Encounter (Signed)
Document were received on April 06, 2022. But they are still missing the diagnosis code on the prescription for patient Diabetic Supply  Please refax with code, so that we don't delay the patients care.    [phone] phone [phone] fax

## 2022-04-11 NOTE — Telephone Encounter (Signed)
Re-faxed again.

## 2022-04-12 ENCOUNTER — Telehealth: Payer: Self-pay | Admitting: Family Medicine

## 2022-04-12 NOTE — Telephone Encounter (Signed)
Contacted Beverly [name] to schedule their annual wellness visit. Appointment made for 04/28/2022.  Beverly [name] Care Guide [facility] TE[facility] Direct Dial: [phone]   

## 2022-04-28 ENCOUNTER — Ambulatory Visit (INDEPENDENT_AMBULATORY_CARE_PROVIDER_SITE_OTHER): Payer: Medicare HMO

## 2022-04-28 ENCOUNTER — Other Ambulatory Visit: Payer: Self-pay | Admitting: Family Medicine

## 2022-04-28 VITALS — BP 110/65 | Ht 65.0 in | Wt 175.6 lb

## 2022-04-28 DIAGNOSIS — E785 Hyperlipidemia, unspecified: Secondary | ICD-10-CM

## 2022-04-28 DIAGNOSIS — E11628 Type 2 diabetes mellitus with other skin complications: Secondary | ICD-10-CM

## 2022-04-28 DIAGNOSIS — E1169 Type 2 diabetes mellitus with other specified complication: Secondary | ICD-10-CM

## 2022-04-28 DIAGNOSIS — Z0000 Encounter for general adult medical examination without abnormal findings: Secondary | ICD-10-CM

## 2022-04-28 NOTE — Progress Notes (Signed)
Subjective:   Beverly [name] is a 88 y.o. female who presents for Medicare Annual (Subsequent) preventive examination.  Review of Systems     Cardiac Risk Factors include: advanced age (>63men, >27 women);diabetes mellitus;dyslipidemia;hypertension     Objective:    Today's Vitals   04/28/22 1133  BP: 110/65  Weight: 175 lb 9.6 oz (79.7 kg)  Height:  (1.651 m)   Body mass index is 29.22 kg/m.     04/28/2022   11:45 AM 04/20/2021    9:44 AM 04/16/2020    9:29 AM 04/16/2019    9:45 AM 09/24/2018    8:34 AM 07/24/2018    1:17 PM 03/28/2017    1:22 PM  Advanced Directives  Does Patient Have a Medical Advance Directive? Yes Yes Yes Yes Yes No Yes  Type of Estate agent of [place];Living will Healthcare Power of [place];Living will Healthcare Power of [place];Living will Healthcare Power of [place];Living will Healthcare Power of [place];Living will  Healthcare Power of [place];Living will  Does patient want to make changes to medical advance directive?   Yes (MAU/Ambulatory/Procedural Areas - Information given)    Yes (MAU/Ambulatory/Procedural Areas - Information given)  Copy of Healthcare Power of Attorney in Chart?  No - copy requested No - copy requested No - copy requested No - copy requested  No - copy requested  Would patient like information on creating a medical advance directive?      No - Patient declined     Current Medications (verified) Outpatient Encounter Medications as of 04/28/2022  Medication Sig   acetaminophen (TYLENOL) 650 MG CR tablet Take 1 tablet by mouth every 6 (six) hours as needed.   atorvastatin (LIPITOR) 40 MG tablet TAKE 1 TABLET BY MOUTH EVERY DAY   Cholecalciferol (VITAMIN D) 2000 units CAPS Take 1 capsule (2,000 Units total) by mouth daily.   ferrous sulfate 325 (65 FE) MG tablet Take 325 mg by mouth 2 (two) times daily with a meal.    furosemide (LASIX) 40 MG tablet Take 1 tablet (40 mg total) by mouth daily as  needed.   Glucosamine Sulfate 500 MG TABS Take 1 tablet by mouth once a week.    loratadine (CLARITIN) 10 MG tablet Take 1 tablet (10 mg total) by mouth daily.   losartan (COZAAR) 50 MG tablet Take 1 tablet (50 mg total) by mouth daily.   metFORMIN (GLUCOPHAGE) 500 MG tablet Take 1 tablet (500 mg total) by mouth 2 (two) times daily with a meal.   montelukast (SINGULAIR) 10 MG tablet Take 1 tablet (10 mg total) by mouth at bedtime.   Multiple Vitamin (MULTI-VITAMINS) TABS Take by mouth.   repaglinide (PRANDIN) 2 MG tablet Take 1 tablet (2 mg total) by mouth 2 (two) times daily before a meal.   vitamin B-12 (CYANOCOBALAMIN) 1000 MCG tablet Take 1,000 mcg by mouth 3 (three) times a week.   vitamin C (ASCORBIC ACID) 500 MG tablet Take 500 mg by mouth daily.   vitamin E 180 MG (400 UNITS) capsule Take 400 Units by mouth daily.   Zinc Acetate 50 MG CAPS Take by mouth daily.    Potassium Chloride ER 20 MEQ TBCR Take 10 mEq by mouth daily as needed. With lasix (Patient not taking: Reported on 03/14/2022)   No facility-administered encounter medications on file as of 04/28/2022.    Allergies (verified) Lisinopril   History: Past Medical History:  Diagnosis Date   Allergy    Arthritis  Diabetes mellitus without complication    Hyperlipidemia    Hypertension    Past Surgical History:  Procedure Laterality Date   ABDOMINAL HYSTERECTOMY     BUNIONECTOMY     COLONOSCOPY     COLONOSCOPY WITH PROPOFOL N/A 09/24/2018   Procedure: COLONOSCOPY WITH PROPOFOL;  Surgeon: Toney Reil, MD;  Location: [facility] ENDOSCOPY;  Service: Gastroenterology;  Laterality: N/A;   ESOPHAGOGASTRODUODENOSCOPY (EGD) WITH PROPOFOL N/A 09/24/2018   Procedure: ESOPHAGOGASTRODUODENOSCOPY (EGD) WITH PROPOFOL;  Surgeon: Toney Reil, MD;  Location: [facility] ENDOSCOPY;  Service: Gastroenterology;  Laterality: N/A;   GIVENS CAPSULE STUDY N/A 10/11/2018   Procedure: GIVENS CAPSULE STUDY;  Surgeon: Toney Reil, MD;   Location: [facility] ENDOSCOPY;  Service: Gastroenterology;  Laterality: N/A;   HERNIA REPAIR     Family History  Problem Relation Age of Onset   Hypertension Mother    Healthy Father    Healthy Daughter    Raynaud syndrome Daughter    Hypertension Daughter    Stroke Sister    Social History   Socioeconomic History   Marital status: Widowed    Spouse name: Sherilyn Cooter   Number of children: 2   Years of education: Not on file   Highest education level: Bachelor's degree (e.g., BA, AB, BS)  Occupational History   Occupation: Retired  Tobacco Use   Smoking status: Never   Smokeless tobacco: Never   Tobacco comments:    smoking cessation materials not required  Vaping Use   Vaping Use: Never used  Substance and Sexual Activity   Alcohol use: No    Alcohol/week: 0.0 standard drinks of alcohol   Drug use: No   Sexual activity: Not Currently  Other Topics Concern   Not on file  Social History Narrative   Pt lives alone.    Social Determinants of Health   Financial Resource Strain: Low Risk  (04/28/2022)   Overall Financial Resource Strain (CARDIA)    Difficulty of Paying Living Expenses: Not hard at all  Food Insecurity: No Food Insecurity (04/28/2022)   Hunger Vital Sign    Worried About Running Out of Food in the Last Year: Never true    Ran Out of Food in the Last Year: Never true  Transportation Needs: No Transportation Needs (04/28/2022)   PRAPARE - Administrator, Civil Service (Medical): No    Lack of Transportation (Non-Medical): No  Physical Activity: Insufficiently Active (04/28/2022)   Exercise Vital Sign    Days of Exercise per Week: 3 days    Minutes of Exercise per Session: 30 min  Stress: No Stress Concern Present (04/28/2022)   [employer] of Occupational Health - Occupational Stress Questionnaire    Feeling of Stress : Not at all  Social Connections: Moderately Isolated (04/28/2022)   Social Connection and Isolation Panel [NHANES]    Frequency of  Communication with Friends and Family: More than three times a week    Frequency of Social Gatherings with Friends and Family: Twice a week    Attends Religious Services: More than 4 times per year    Active Member of [employer] or Organizations: No    Attends Banker Meetings: Never    Marital Status: Widowed    Tobacco Counseling Counseling given: Not Answered Tobacco comments: smoking cessation materials not required   Clinical Intake:  Pre-visit preparation completed: Yes  Pain : No/denies pain     BMI - recorded: 29.22 Nutritional Status: BMI 25 -29 Overweight Nutritional Risks: None Diabetes:  Yes CBG done?: Yes (bs 98 this am at home) CBG resulted in Enter/ Edit results?: No Did pt. bring in CBG monitor from home?: No  How often do you need to have someone help you when you read instructions, pamphlets, or other written materials from your doctor or pharmacy?: 1 - Never  Diabetic?yes  Interpreter Needed?: No  Comments: lives alone Information entered by :: B.Summer Mccolgan,LPN   Activities of Daily Living    04/28/2022   11:45 AM 03/14/2022   11:00 AM  In your present state of health, do you have any difficulty performing the following activities:  Hearing? 0 0  Vision? 0 0  Difficulty concentrating or making decisions? 0 0  Walking or climbing stairs? 0 0  Dressing or bathing? 0 0  Doing errands, shopping? 0 0  Preparing Food and eating ? N   Using the Toilet? N   In the past six months, have you accidently leaked urine? N   Do you have problems with loss of bowel control? N   Managing your Medications? N   Managing your Finances? N   Housekeeping or managing your Housekeeping? N     Patient Care Team: Alba Cory, MD as PCP - General (Family Medicine) Linus Galas, DPM as Consulting Physician (Podiatry) Allegra Lai, Loel Dubonnet, MD as Consulting Physician (Gastroenterology) Duke Salvia, MD as Consulting Physician (Cardiology) Lucy Chris, MD  as Consulting Physician (Neurosurgery)  Indicate any recent Medical Services you may have received from other than [facility] providers in the past year (date may be approximate).     Assessment:   This is a routine wellness examination for [facility].  Hearing/Vision screen Hearing Screening - Comments:: Adequate hearing Vision Screening - Comments:: Adequate vision;readers only Dr Brooke Dare  Dietary issues and exercise activities discussed: Current Exercise Habits: Home exercise routine, Type of exercise: Other - see comments (Nu step machine), Time (Minutes): 30, Frequency (Times/Week): 3, Weekly Exercise (Minutes/Week): 90, Intensity: Mild, Exercise limited by: orthopedic condition(s)   Goals Addressed             This Visit's Progress    DIET - INCREASE WATER INTAKE   Not on track    Recommend to drink at least 6-8 8oz glasses of water per day.     Eat more vegetables   On track    Recommend eating at least 2 servings of vegetables a day.       Depression Screen    04/28/2022   11:42 AM 03/14/2022   11:00 AM 11/10/2021   10:36 AM 07/09/2021   10:54 AM 04/20/2021    9:41 AM 03/08/2021   10:58 AM 11/10/2020   10:56 AM  PHQ 2/9 Scores  PHQ - 2 Score 0 0 0 0 0 0 0  PHQ- 9 Score  0 0 0  0 0    Fall Risk    04/28/2022   11:36 AM 03/14/2022   10:59 AM 11/10/2021   10:36 AM 07/09/2021   10:54 AM 04/20/2021    9:45 AM  Fall Risk   Falls in the past year? 0 0 0 0 0  Number falls in past yr: 0  0 0 0  Injury with Fall? 0  0 0 0  Risk for fall due to : No Fall Risks  No Fall Risks Impaired balance/gait No Fall Risks  Follow up Education provided;Falls prevention discussed Falls prevention discussed;Education provided;Falls evaluation completed Falls prevention discussed Falls prevention discussed Falls prevention discussed    FALL RISK  PREVENTION PERTAINING TO THE HOME:  Any stairs in or around the home? No  If so, are there any without handrails? No  Home free of loose throw rugs in  walkways, pet beds, electrical cords, etc? Yes  Adequate lighting in your home to reduce risk of falls? Yes   ASSISTIVE DEVICES UTILIZED TO PREVENT FALLS:  Life alert? yes Use of a cane, walker or w/c? Yes cane sometimes Grab bars in the bathroom? Yes  Shower chair or bench in shower? Yes  Elevated toilet seat or a handicapped toilet? Yes   TIMED UP AND GO:  Was the test performed? Yes .  Length of time to ambulate 10 feet: 15 sec.   Gait steady and fast without use of assistive device  Cognitive Function:        04/28/2022   11:46 AM 04/16/2020    9:32 AM 04/16/2019    9:48 AM 03/28/2017    1:26 PM 04/11/2016   10:43 AM  6CIT Screen  What Year? 0 points 0 points 0 points 0 points 0 points  What month? 0 points 0 points 0 points 0 points 0 points  What time? 0 points 0 points 0 points 0 points 0 points  Count back from 20 0 points 0 points 0 points 0 points 0 points  Months in reverse 0 points 0 points 0 points 0 points 0 points  Repeat phrase 0 points 0 points 0 points 0 points 0 points  Total Score 0 points 0 points 0 points 0 points 0 points    Immunizations Immunization History  Administered Date(s) Administered   Fluad Quad(high Dose 65+) 09/05/2018, 09/06/2019, 11/10/2021   Influenza Split 01/27/2003, 10/16/2006, 10/25/2007, 09/03/2009   Influenza, High Dose Seasonal PF 10/06/2015, 09/25/2017, 10/22/2020   Influenza, Seasonal, Injecte, Preservative Fre 09/28/2010, 10/03/2011   Influenza,inj,Quad PF,6+ Mos 09/17/2013, 10/14/2016   Influenza-Unspecified 09/17/2013   PFIZER Comirnaty(Gray Top)Covid-19 Tri-Sucrose Vaccine 02/18/2020   PFIZER(Purple Top)SARS-COV-2 Vaccination 01/24/2019, 02/14/2019   Pfizer Covid-19 Vaccine Bivalent Booster 68yrs & up 11/11/2020   Pneumococcal Conjugate-13 09/17/2013   Pneumococcal Polysaccharide-23 01/03/2001, 11/01/2012   Tdap 05/25/2010, 07/24/2018   Zoster Recombinat (Shingrix) 10/23/2019, 12/20/2019   Zoster, Live 05/26/2010     TDAP status: Up to date  Flu Vaccine status: Up to date  Pneumococcal vaccine status: Up to date  Covid-19 vaccine status: Completed vaccines  Qualifies for Shingles Vaccine? Yes   Zostavax completed No   Shingrix Completed?: No.    Education has been provided regarding the importance of this vaccine. Patient has been advised to call insurance company to determine out of pocket expense if they have not yet received this vaccine. Advised may also receive vaccine at local pharmacy or Health Dept. Verbalized acceptance and understanding.  Screening Tests Health Maintenance  Topic Date Due   COVID-19 Vaccine (5 - 2023-24 season) 09/03/2021   INFLUENZA VACCINE  08/04/2022   HEMOGLOBIN A1C  09/14/2022   FOOT EXAM  11/11/2022   OPHTHALMOLOGY EXAM  02/04/2023   Medicare Annual Wellness (AWV)  04/28/2023   DTaP/Tdap/Td (3 - Td or Tdap) 07/23/2028   Pneumonia Vaccine 78+ Years old  Completed   DEXA SCAN  Completed   Zoster Vaccines- Shingrix  Completed   HPV VACCINES  Aged Out    Health Maintenance  Health Maintenance Due  Topic Date Due   COVID-19 Vaccine (5 - 2023-24 season) 09/03/2021    Colorectal cancer screening: No longer required.   Mammogram status: No longer required due to age.  Bone Density status: Completed yes. Results reflect: Bone density results: OSTEOPENIA. Repeat every 5 years.  Lung Cancer Screening: (Low Dose CT Chest recommended if Age 47-80 years, 30 pack-year currently smoking OR have quit w/in 15years.) does not qualify.   Lung Cancer Screening Referral: no  Additional Screening:  Hepatitis C Screening: does not qualify; Completed no  Vision Screening: Recommended annual ophthalmology exams for early detection of glaucoma and other disorders of the eye. Is the patient up to date with their annual eye exam?  Yes  Who is the provider or what is the name of the office in which the patient attends annual eye exams? Dr Brooke Dare If pt is not established  with a provider, would they like to be referred to a provider to establish care? No .   Dental Screening: Recommended annual dental exams for proper oral hygiene  Community Resource Referral / Chronic Care Management: CRR required this visit?  No   CCM required this visit?  No      Plan:     I have personally reviewed and noted the following in the patient's chart:   Medical and social history Use of alcohol, tobacco or illicit drugs  Current medications and supplements including opioid prescriptions. Patient is not currently taking opioid prescriptions. Functional ability and status Nutritional status Physical activity Advanced directives List of other physicians Hospitalizations, surgeries, and ER visits in previous 12 months Vitals Screenings to include cognitive, depression, and falls Referrals and appointments  In addition, I have reviewed and discussed with patient certain preventive protocols, quality metrics, and best practice recommendations. A written personalized care plan for preventive services as well as general preventive health recommendations were provided to patient.     Sue Lush, LPN   1/61/0960   Nurse Notes: The patient states  doing well and has no concerns or questions at this time.

## 2022-04-28 NOTE — Patient Instructions (Signed)
[name] , Thank you for taking time to come for your Medicare Wellness Visit. I appreciate your ongoing commitment to your health goals. Please review the following plan we discussed and let me know if I can assist you in the future.   These are the goals we discussed:  Goals      DIET - INCREASE WATER INTAKE     Recommend to drink at least 6-8 8oz glasses of water per day.     Eat more vegetables     Recommend eating at least 2 servings of vegetables a day.        This is a list of the screening recommended for you and due dates:  Health Maintenance  Topic Date Due   COVID-19 Vaccine (5 - 2023-24 season) 09/03/2021   Flu Shot  08/04/2022   Hemoglobin A1C  09/14/2022   Complete foot exam   11/11/2022   Eye exam for diabetics  02/04/2023   Medicare Annual Wellness Visit  04/28/2023   DTaP/Tdap/Td vaccine (3 - Td or Tdap) 07/23/2028   Pneumonia Vaccine  Completed   DEXA scan (bone density measurement)  Completed   Zoster (Shingles) Vaccine  Completed   HPV Vaccine  Aged Out    Advanced directives: yes  Conditions/risks identified: low falls risk  Next appointment: Follow up in one year for your annual wellness visit 05/04/2023 @ 11:00 telephone   Preventive Care 65 Years and Older, Female Preventive care refers to lifestyle choices and visits with your health care provider that can promote health and wellness. What does preventive care include? A yearly physical exam. This is also called an annual well check. Dental exams once or twice a year. Routine eye exams. Ask your health care provider how often you should have your eyes checked. Personal lifestyle choices, including: Daily care of your teeth and gums. Regular physical activity. Eating a healthy diet. Avoiding tobacco and drug use. Limiting alcohol use. Practicing safe sex. Taking low-dose aspirin every day. Taking vitamin and mineral supplements as recommended by your health care provider. What happens  during an annual well check? The services and screenings done by your health care provider during your annual well check will depend on your age, overall health, lifestyle risk factors, and family history of disease. Counseling  Your health care provider may ask you questions about your: Alcohol use. Tobacco use. Drug use. Emotional well-being. Home and relationship well-being. Sexual activity. Eating habits. History of falls. Memory and ability to understand (cognition). Work and work Astronomer. Reproductive health. Screening  You may have the following tests or measurements: Height, weight, and BMI. Blood pressure. Lipid and cholesterol levels. These may be checked every 5 years, or more frequently if you are over 73 years old. Skin check. Lung cancer screening. You may have this screening every year starting at age 52 if you have a 30-pack-year history of smoking and currently smoke or have quit within the past 15 years. Fecal occult blood test (FOBT) of the stool. You may have this test every year starting at age 10. Flexible sigmoidoscopy or colonoscopy. You may have a sigmoidoscopy every 5 years or a colonoscopy every 10 years starting at age 61. Hepatitis C blood test. Hepatitis B blood test. Sexually transmitted disease (STD) testing. Diabetes screening. This is done by checking your blood sugar (glucose) after you have not eaten for a while (fasting). You may have this done every 1-3 years. Bone density scan. This is done to screen for osteoporosis. You  may have this done starting at age 19. Mammogram. This may be done every 1-2 years. Talk to your health care provider about how often you should have regular mammograms. Talk with your health care provider about your test results, treatment options, and if necessary, the need for more tests. Vaccines  Your health care provider may recommend certain vaccines, such as: Influenza vaccine. This is recommended every  year. Tetanus, diphtheria, and acellular pertussis (Tdap, Td) vaccine. You may need a Td booster every 10 years. Zoster vaccine. You may need this after age 3. Pneumococcal 13-valent conjugate (PCV13) vaccine. One dose is recommended after age 29. Pneumococcal polysaccharide (PPSV23) vaccine. One dose is recommended after age 108. Talk to your health care provider about which screenings and vaccines you need and how often you need them. This information is not intended to replace advice given to you by your health care provider. Make sure you discuss any questions you have with your health care provider. Document Released: 01/16/2015 Document Revised: 09/09/2015 Document Reviewed: 10/21/2014 Elsevier Interactive Patient Education  2017 [place] Prevention in the Home Falls can cause injuries. They can happen to people of all ages. There are many things you can do to make your home safe and to help prevent falls. What can I do on the outside of my home? Regularly fix the edges of walkways and driveways and fix any cracks. Remove anything that might make you trip as you walk through a door, such as a raised step or threshold. Trim any bushes or trees on the path to your home. Use bright outdoor lighting. Clear any walking paths of anything that might make someone trip, such as rocks or tools. Regularly check to see if handrails are loose or broken. Make sure that both sides of any steps have handrails. Any raised decks and porches should have guardrails on the edges. Have any leaves, snow, or ice cleared regularly. Use sand or salt on walking paths during winter. Clean up any spills in your garage right away. This includes oil or grease spills. What can I do in the bathroom? Use night lights. Install grab bars by the toilet and in the tub and shower. Do not use towel bars as grab bars. Use non-skid mats or decals in the tub or shower. If you need to sit down in the shower, use a  plastic, non-slip stool. Keep the floor dry. Clean up any water that spills on the floor as soon as it happens. Remove soap buildup in the tub or shower regularly. Attach bath mats securely with double-sided non-slip rug tape. Do not have throw rugs and other things on the floor that can make you trip. What can I do in the bedroom? Use night lights. Make sure that you have a light by your bed that is easy to reach. Do not use any sheets or blankets that are too big for your bed. They should not hang down onto the floor. Have a firm chair that has side arms. You can use this for support while you get dressed. Do not have throw rugs and other things on the floor that can make you trip. What can I do in the kitchen? Clean up any spills right away. Avoid walking on wet floors. Keep items that you use a lot in easy-to-reach places. If you need to reach something above you, use a strong step stool that has a grab bar. Keep electrical cords out of the way. Do not use floor  polish or wax that makes floors slippery. If you must use wax, use non-skid floor wax. Do not have throw rugs and other things on the floor that can make you trip. What can I do with my stairs? Do not leave any items on the stairs. Make sure that there are handrails on both sides of the stairs and use them. Fix handrails that are broken or loose. Make sure that handrails are as long as the stairways. Check any carpeting to make sure that it is firmly attached to the stairs. Fix any carpet that is loose or worn. Avoid having throw rugs at the top or bottom of the stairs. If you do have throw rugs, attach them to the floor with carpet tape. Make sure that you have a light switch at the top of the stairs and the bottom of the stairs. If you do not have them, ask someone to add them for you. What else can I do to help prevent falls? Wear shoes that: Do not have high heels. Have rubber bottoms. Are comfortable and fit you  well. Are closed at the toe. Do not wear sandals. If you use a stepladder: Make sure that it is fully opened. Do not climb a closed stepladder. Make sure that both sides of the stepladder are locked into place. Ask someone to hold it for you, if possible. Clearly mark and make sure that you can see: Any grab bars or handrails. First and last steps. Where the edge of each step is. Use tools that help you move around (mobility aids) if they are needed. These include: Canes. Walkers. Scooters. Crutches. Turn on the lights when you go into a dark area. Replace any light bulbs as soon as they burn out. Set up your furniture so you have a clear path. Avoid moving your furniture around. If any of your floors are uneven, fix them. If there are any pets around you, be aware of where they are. Review your medicines with your doctor. Some medicines can make you feel dizzy. This can increase your chance of falling. Ask your doctor what other things that you can do to help prevent falls. This information is not intended to replace advice given to you by your health care provider. Make sure you discuss any questions you have with your health care provider. Document Released: 10/16/2008 Document Revised: 05/28/2015 Document Reviewed: 01/24/2014 Elsevier Interactive Patient Education  2017 [employer].

## 2022-07-14 NOTE — Progress Notes (Signed)
Name: Beverly [name]   MRN: [id number]    DOB: 22-Jul-1934   Date:07/15/2022       Progress Note  Subjective  Chief Complaint  Follow Up  HPI  DMII: she is now taking Metformin to 500  mg daily,  taking Prandin two  times a day before her meals, today A1C is stable at 7.2 % - that is well controlled for her.    Fasting  is usually 95-100 and post prandially around 150 .  Eye exam is up to date -  she has proliferative diabetic retinopathy and also macular edema of right eye, sees podiatrist for callus and has recurrence of bunion on left foot,  she has a history of microalbuminuria but is on ARB and last level normalized . She denies polyphagia or polydipsia or polyuria    Meningioma: History of syncopal episode: in 2020 seen by cardiologist, Dr. Graciela Husbands, echo showed normal EF. Possibly from change in bp medication because of shortage. She has not had any more episodes Had sub-dural hematoma, she had a repeat MRI done 08/2019 and showed a very mild increase in size of meningioma, repeat MRI done 11/22 but Dr. Adriana Simas left and she is now seeing Dr. Marcell Barlow and since very mild change no longer repeating imaging but to notify him if any symptoms such as headaches, double vision or balance problems, she still sees him yearly for follow ups. No problems at this time   IMPRESSION - MRI brain done 11/22 1. Minimal enlargement of a left paracentral planum sphenoidale meningioma since 2020, now up to 2.9 cm. Regional mass effect is stable. Mild vasogenic edema in the left inferior frontal gyrus has increased since 2020 but is stable from last year.   2. No new intracranial abnormality. 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  HTN:  BP at our office seems to be towards low end of normal, she states her bp is 120's/130's at home, denies orthostatic changes , no headaches, denies SOB. Discussed going down to half pill of losartan if  orthostatic problems, also needs to stay hydrated    Hyperlipidemia: taking Atorvastatin, no myalgias. Last LDL was at goal done 03/2022    AR: well controlled, taking medication prn, no rhinorrhea or nasal congestion at this time.    OA:  it started after a fall 35 plus years ago secondary to a fall, initially right knee, but now seems to be worse on the left side.  She states symptoms under control now  She is still seeing chiropractor - Dr. Chriss Czar and is stable.   Unintentional weight loss/Protein malnutrition : she had lost 18 lbs from 2018 to 2019 .  She states she was under a lot of stress since Summer of 2018 worried about grand-daughter  also her daughter was diagnosed with uterine cancer. Since she lost weight, she got motivated to eat healthier, she fell  Summer 2020 and broke her dentures during a syncopal episode.  Weight was stable in the 170 lbs however it went down to 167 lbs she has been more conscious about eating three meals and is now stable in the 170 lbs range   Osteopenia: last vitamin D was good , discussed high calcium diet . She stopped taking Alendronate since she took  it for over 5 years, last bone density showed osteopenia but low FRAX   Patient Active Problem List   Diagnosis Date Noted   B12 deficiency 11/10/2021  Mild protein-calorie malnutrition (HCC) 11/10/2021   History of subdural hematoma 11/10/2021   Meningioma (HCC) 11/10/2020   Iron deficiency anemia    Syncope and collapse 07/24/2018   Type 2 diabetes mellitus with stable proliferative retinopathy of both eyes, without long-term current use of insulin (HCC) 02/27/2018   Type 2 diabetes mellitus with pressure callus (HCC) 10/24/2017   Diabetic retinopathy (HCC) 11/12/2015   Osteoarthritis 06/02/2015   Osteopenia after menopause 06/02/2015   Narrowing of intervertebral disc space 06/02/2015   Dyslipidemia associated with type 2 diabetes mellitus (HCC) 06/02/2015   Perennial allergic rhinitis  02/28/2007   Essential (primary) hypertension 06/26/2006   Pure hypercholesterolemia 06/26/2006    Past Surgical History:  Procedure Laterality Date   ABDOMINAL HYSTERECTOMY     BUNIONECTOMY     COLONOSCOPY     COLONOSCOPY WITH PROPOFOL N/A 09/24/2018   Procedure: COLONOSCOPY WITH PROPOFOL;  Surgeon: Toney Reil, MD;  Location: [facility] ENDOSCOPY;  Service: Gastroenterology;  Laterality: N/A;   ESOPHAGOGASTRODUODENOSCOPY (EGD) WITH PROPOFOL N/A 09/24/2018   Procedure: ESOPHAGOGASTRODUODENOSCOPY (EGD) WITH PROPOFOL;  Surgeon: Toney Reil, MD;  Location: [facility] ENDOSCOPY;  Service: Gastroenterology;  Laterality: N/A;   GIVENS CAPSULE STUDY N/A 10/11/2018   Procedure: GIVENS CAPSULE STUDY;  Surgeon: Toney Reil, MD;  Location: [facility] ENDOSCOPY;  Service: Gastroenterology;  Laterality: N/A;   HERNIA REPAIR      Family History  Problem Relation Age of Onset   Hypertension Mother    Healthy Father    Healthy Daughter    Raynaud syndrome Daughter    Hypertension Daughter    Stroke Sister     Social History   Tobacco Use   Smoking status: Never   Smokeless tobacco: Never   Tobacco comments:    smoking cessation materials not required  Substance Use Topics   Alcohol use: No    Alcohol/week: 0.0 standard drinks of alcohol     Current Outpatient Medications:    acetaminophen (TYLENOL) 650 MG CR tablet, Take 1 tablet by mouth every 6 (six) hours as needed., Disp: , Rfl:    atorvastatin (LIPITOR) 40 MG tablet, TAKE 1 TABLET BY MOUTH EVERY DAY, Disp: 90 tablet, Rfl: 0   Cholecalciferol (VITAMIN D) 2000 units CAPS, Take 1 capsule (2,000 Units total) by mouth daily., Disp: 30 capsule, Rfl: 0   ferrous sulfate 325 (65 FE) MG tablet, Take 325 mg by mouth 2 (two) times daily with a meal. , Disp: , Rfl:    furosemide (LASIX) 40 MG tablet, Take 1 tablet (40 mg total) by mouth daily as needed., Disp: 90 tablet, Rfl: 0   Glucosamine Sulfate 500 MG TABS, Take 1 tablet by mouth  once a week. , Disp: , Rfl:    loratadine (CLARITIN) 10 MG tablet, Take 1 tablet (10 mg total) by mouth daily., Disp: 90 tablet, Rfl: 1   losartan (COZAAR) 50 MG tablet, Take 1 tablet (50 mg total) by mouth daily., Disp: 90 tablet, Rfl: 1   metFORMIN (GLUCOPHAGE) 500 MG tablet, Take 1 tablet (500 mg total) by mouth 2 (two) times daily with a meal., Disp: 180 tablet, Rfl: 1   montelukast (SINGULAIR) 10 MG tablet, Take 1 tablet (10 mg total) by mouth at bedtime., Disp: 90 tablet, Rfl: 1   Multiple Vitamin (MULTI-VITAMINS) TABS, Take by mouth., Disp: , Rfl:    Potassium Chloride ER 20 MEQ TBCR, Take 10 mEq by mouth daily as needed. With lasix, Disp: , Rfl:    repaglinide (PRANDIN) 2 MG  tablet, Take 1 tablet (2 mg total) by mouth 2 (two) times daily before a meal., Disp: 180 tablet, Rfl: 1   vitamin B-12 (CYANOCOBALAMIN) 1000 MCG tablet, Take 1,000 mcg by mouth 3 (three) times a week., Disp: , Rfl:    vitamin C (ASCORBIC ACID) 500 MG tablet, Take 500 mg by mouth daily., Disp: , Rfl:    vitamin E 180 MG (400 UNITS) capsule, Take 400 Units by mouth daily., Disp: , Rfl:    Zinc Acetate 50 MG CAPS, Take by mouth daily. , Disp: , Rfl:   Allergies  Allergen Reactions   Lisinopril     I personally reviewed active problem list, medication list, allergies, family history, social history, health maintenance with the patient/caregiver today.   ROS  Ten systems reviewed and is negative except as mentioned in HPI    Objective  Vitals:   07/15/22 1039  BP: 112/66  Pulse: 98  Resp: 16  Temp: 97.9 F (36.6 C)  TempSrc: Oral  SpO2: 97%  Weight: 172 lb (78 kg)  Height: 5\' 5"  (1.651 m)    Body mass index is 28.62 kg/m.  Physical Exam  Constitutional: Patient appears well-developed and well-nourished. Obese  No distress.  HEENT: head atraumatic, normocephalic, pupils equal and reactive to light, neck supple Cardiovascular: Normal rate, regular rhythm and normal heart sounds.  No murmur heard.  No BLE edema. Pulmonary/Chest: Effort normal and breath sounds normal. No respiratory distress. Abdominal: Soft.  There is no tenderness. Psychiatric: Patient has a normal mood and affect. behavior is normal. Judgment and thought content normal.   PHQ2/9:    04/28/2022   11:42 AM 03/14/2022   11:00 AM 11/10/2021   10:36 AM 07/09/2021   10:54 AM 04/20/2021    9:41 AM  Depression screen PHQ 2/9  Decreased Interest 0 0 0 0 0  Down, Depressed, Hopeless 0 0 0 0 0  PHQ - 2 Score 0 0 0 0 0  Altered sleeping  0 0 0   Tired, decreased energy  0 0 0   Change in appetite  0 0 0   Feeling bad or failure about yourself   0 0 0   Trouble concentrating  0 0 0   Moving slowly or fidgety/restless  0 0 0   Suicidal thoughts  0 0 0   PHQ-9 Score  0 0 0     phq 9 is negative   Fall Risk:    07/15/2022   10:40 AM 04/28/2022   11:36 AM 03/14/2022   10:59 AM 11/10/2021   10:36 AM 07/09/2021   10:54 AM  Fall Risk   Falls in the past year? 0 0 0 0 0  Number falls in past yr:  0  0 0  Injury with Fall?  0  0 0  Risk for fall due to : No Fall Risks No Fall Risks  No Fall Risks Impaired balance/gait  Follow up Falls prevention discussed Education provided;Falls prevention discussed Falls prevention discussed;Education provided;Falls evaluation completed Falls prevention discussed Falls prevention discussed     Assessment & Plan  1. Type 2 diabetes mellitus with microalbuminuria, without long-term current use of insulin (HCC)  - POCT HgB A1C

## 2022-07-15 ENCOUNTER — Encounter: Payer: Self-pay | Admitting: Family Medicine

## 2022-07-15 ENCOUNTER — Ambulatory Visit: Payer: Medicare HMO | Admitting: Family Medicine

## 2022-07-15 VITALS — BP 112/66 | HR 98 | Temp 97.9°F | Resp 16 | Ht 65.0 in | Wt 172.0 lb

## 2022-07-15 DIAGNOSIS — E1129 Type 2 diabetes mellitus with other diabetic kidney complication: Secondary | ICD-10-CM | POA: Diagnosis not present

## 2022-07-15 DIAGNOSIS — D329 Benign neoplasm of meninges, unspecified: Secondary | ICD-10-CM | POA: Diagnosis not present

## 2022-07-15 DIAGNOSIS — E11628 Type 2 diabetes mellitus with other skin complications: Secondary | ICD-10-CM

## 2022-07-15 DIAGNOSIS — E538 Deficiency of other specified B group vitamins: Secondary | ICD-10-CM

## 2022-07-15 DIAGNOSIS — J3089 Other allergic rhinitis: Secondary | ICD-10-CM

## 2022-07-15 DIAGNOSIS — E1169 Type 2 diabetes mellitus with other specified complication: Secondary | ICD-10-CM

## 2022-07-15 DIAGNOSIS — Z8679 Personal history of other diseases of the circulatory system: Secondary | ICD-10-CM

## 2022-07-15 DIAGNOSIS — R809 Proteinuria, unspecified: Secondary | ICD-10-CM | POA: Diagnosis not present

## 2022-07-15 DIAGNOSIS — M8580 Other specified disorders of bone density and structure, unspecified site: Secondary | ICD-10-CM

## 2022-07-15 DIAGNOSIS — I10 Essential (primary) hypertension: Secondary | ICD-10-CM

## 2022-07-15 DIAGNOSIS — M1711 Unilateral primary osteoarthritis, right knee: Secondary | ICD-10-CM

## 2022-07-15 DIAGNOSIS — Z780 Asymptomatic menopausal state: Secondary | ICD-10-CM

## 2022-07-15 DIAGNOSIS — E785 Hyperlipidemia, unspecified: Secondary | ICD-10-CM

## 2022-07-15 LAB — POCT GLYCOSYLATED HEMOGLOBIN (HGB A1C): Hemoglobin A1C: 7.2 % — AB (ref 4.0–5.6)

## 2022-07-15 MED ORDER — ATORVASTATIN CALCIUM 40 MG PO TABS: 40.0000 mg | ORAL_TABLET | Freq: Every day | ORAL | 1 refills | Status: DC

## 2022-08-01 ENCOUNTER — Other Ambulatory Visit: Payer: Self-pay | Admitting: Family Medicine

## 2022-08-01 DIAGNOSIS — E1169 Type 2 diabetes mellitus with other specified complication: Secondary | ICD-10-CM

## 2022-09-11 ENCOUNTER — Other Ambulatory Visit: Payer: Self-pay | Admitting: Family Medicine

## 2022-09-11 DIAGNOSIS — E1169 Type 2 diabetes mellitus with other specified complication: Secondary | ICD-10-CM

## 2022-09-27 ENCOUNTER — Other Ambulatory Visit: Payer: Self-pay | Admitting: Family Medicine

## 2022-09-27 DIAGNOSIS — I10 Essential (primary) hypertension: Secondary | ICD-10-CM

## 2022-09-28 ENCOUNTER — Other Ambulatory Visit: Payer: Self-pay

## 2022-09-28 DIAGNOSIS — I10 Essential (primary) hypertension: Secondary | ICD-10-CM

## 2022-10-12 ENCOUNTER — Telehealth: Payer: Self-pay

## 2022-10-12 NOTE — Telephone Encounter (Signed)
Attempted to reach patient to obtain information on how many times a day patient checks her blood glucose levels and if it has been stable or not. Dr. Carlynn Purl received paperwork needing clarification from [employer] who sends out the supplies.

## 2022-11-10 NOTE — Progress Notes (Signed)
Name: Beverly [name]   MRN: [id number]    DOB: [birth year]   Date:11/11/2022       Progress Note  Subjective  Chief Complaint  Follow Up  HPI DMII: she is now taking Metformin to 500  mg daily,  taking Prandin two times a day before her meals, today A1C is up again from 7.2 % to 7.6 %   Fasting  is usually 95-104  and post prandially around 145-150 , she is willing to go up on Metformin to three pills daily and monitor .  Eye exam is up to date -  she has proliferative diabetic retinopathy and also macular edema of right eye, sees podiatrist for callus and has recurrence of bunion on left foot,  she has a history of microalbuminuria but is on ARB and last level normalized, we will have to stop ARB today due to low bp and continue to monitor kidney function  . She denies polyphagia or polydipsia or polyuria    Meningioma: History of syncopal episode: in 2020 seen by cardiologist, Dr. Graciela Husbands, echo showed normal EF. Possibly from change in bp medication because of shortage. She has not had any more episodes Had sub-dural hematoma, she had a repeat MRI done 08/2019 and showed a very mild increase in size of meningioma, repeat MRI done 11/22 but Dr. Adriana Simas left and she is now seeing Dr. Marcell Barlow and since very mild change no longer repeating imaging but to notify him if any symptoms such as headaches, double vision or balance problems. She recently fail while organizing a closet , no dizziness , she just felt weak and slowly fell down to the floor. She pushed her emergency alert and EMS helped her get up   IMPRESSION - MRI brain done 11/22 1. Minimal enlargement of a left paracentral planum sphenoidale meningioma since 2020, now up to 2.9 cm. Regional mass effect is stable. Mild vasogenic edema in the left inferior frontal gyrus has increased since 2020 but is stable from last year.   2. No new intracranial abnormality. 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  HTN:  BP at our office seems to be towards low end of normal, last visit we asked her to cut down the losartan from 50 mg to half pill and bp is still low , we will stop ARB and monitor. No chest pain or palpitation    Hyperlipidemia: taking Atorvastatin, no myalgias. Last LDL was at goal done 03/2022 . Recheck labs next visit    AR: well controlled, taking medication prn, no rhinorrhea or nasal congestion at this time.    OA:  it started after a fall 35 plus years ago secondary to a fall, initially right knee, but now seems to be worse on the left side. Symptoms are stable  She is still seeing chiropractor - Dr. Chriss Czar and is stable.   Unintentional weight loss/Protein malnutrition : she had lost 18 lbs from 2018 to 2019 .  She states she was under a lot of stress since Summer of 2018 worried about grand-daughter  also her daughter was diagnosed with uterine cancer. Since she lost weight, she got motivated to eat healthier, she fell  Summer 2020 and broke her dentures during a syncopal episode.  Weight was stable in the 170 lbs but she states appetite is poor again and weight is below 170 lbs again. Discussed protein supplementation   Osteopenia: last vitamin D was good , discussed high  calcium diet . She stopped taking Alendronate since she took  it for over 5 years, last bone density showed osteopenia but low FRAX , we will recheck labs in 2026   Patient Active Problem List   Diagnosis Date Noted   B12 deficiency 11/10/2021   Mild protein-calorie malnutrition (HCC) 11/10/2021   History of subdural hematoma 11/10/2021   Meningioma (HCC) 11/10/2020   Iron deficiency anemia    Type 2 diabetes mellitus with stable proliferative retinopathy of both eyes, without long-term current use of insulin (HCC) 02/27/2018   Type 2 diabetes mellitus with pressure callus (HCC) 10/24/2017   Diabetic retinopathy (HCC) 11/12/2015   Osteoarthritis 06/02/2015   Osteopenia after  menopause 06/02/2015   Narrowing of intervertebral disc space 06/02/2015   Dyslipidemia associated with type 2 diabetes mellitus (HCC) 06/02/2015   Perennial allergic rhinitis 02/28/2007   Essential (primary) hypertension 06/26/2006   Pure hypercholesterolemia 06/26/2006    Past Surgical History:  Procedure Laterality Date   ABDOMINAL HYSTERECTOMY     BUNIONECTOMY     COLONOSCOPY     COLONOSCOPY WITH PROPOFOL N/A 09/24/2018   Procedure: COLONOSCOPY WITH PROPOFOL;  Surgeon: Toney Reil, MD;  Location: [facility] ENDOSCOPY;  Service: Gastroenterology;  Laterality: N/A;   ESOPHAGOGASTRODUODENOSCOPY (EGD) WITH PROPOFOL N/A 09/24/2018   Procedure: ESOPHAGOGASTRODUODENOSCOPY (EGD) WITH PROPOFOL;  Surgeon: Toney Reil, MD;  Location: [facility] ENDOSCOPY;  Service: Gastroenterology;  Laterality: N/A;   GIVENS CAPSULE STUDY N/A 10/11/2018   Procedure: GIVENS CAPSULE STUDY;  Surgeon: Toney Reil, MD;  Location: [facility] ENDOSCOPY;  Service: Gastroenterology;  Laterality: N/A;   HERNIA REPAIR      Family History  Problem Relation Age of Onset   Hypertension Mother    Healthy Father    Healthy Daughter    Raynaud syndrome Daughter    Hypertension Daughter    Stroke Sister     Social History   Tobacco Use   Smoking status: Never   Smokeless tobacco: Never   Tobacco comments:    smoking cessation materials not required  Substance Use Topics   Alcohol use: No    Alcohol/week: 0.0 standard drinks of alcohol     Current Outpatient Medications:    acetaminophen (TYLENOL) 650 MG CR tablet, Take 1 tablet by mouth every 6 (six) hours as needed., Disp: , Rfl:    Cholecalciferol (VITAMIN D) 2000 units CAPS, Take 1 capsule (2,000 Units total) by mouth daily., Disp: 30 capsule, Rfl: 0   ferrous sulfate 325 (65 FE) MG tablet, Take 325 mg by mouth 2 (two) times daily with a meal. , Disp: , Rfl:    Glucosamine Sulfate 500 MG TABS, Take 1 tablet by mouth once a week. , Disp: , Rfl:     loratadine (CLARITIN) 10 MG tablet, Take 1 tablet (10 mg total) by mouth daily., Disp: 90 tablet, Rfl: 1   Multiple Vitamin (MULTI-VITAMINS) TABS, Take by mouth., Disp: , Rfl:    repaglinide (PRANDIN) 2 MG tablet, TAKE 1 TABLET (2 MG TOTAL) BY MOUTH 2 (TWO) TIMES DAILY BEFORE A MEAL., Disp: 180 tablet, Rfl: 1   vitamin B-12 (CYANOCOBALAMIN) 1000 MCG tablet, Take 1,000 mcg by mouth 3 (three) times a week., Disp: , Rfl:    vitamin C (ASCORBIC ACID) 500 MG tablet, Take 500 mg by mouth daily., Disp: , Rfl:    vitamin E 180 MG (400 UNITS) capsule, Take 400 Units by mouth daily., Disp: , Rfl:    Zinc Acetate 50 MG CAPS, Take by mouth  daily. , Disp: , Rfl:    atorvastatin (LIPITOR) 40 MG tablet, Take 1 tablet (40 mg total) by mouth daily., Disp: 90 tablet, Rfl: 1   metFORMIN (GLUCOPHAGE) 500 MG tablet, Take 1-2 tablets (500-1,000 mg total) by mouth 2 (two) times daily with a meal., Disp: 270 tablet, Rfl: 0   montelukast (SINGULAIR) 10 MG tablet, Take 1 tablet (10 mg total) by mouth at bedtime., Disp: 90 tablet, Rfl: 1  Allergies  Allergen Reactions   Lisinopril     I personally reviewed active problem list, medication list, allergies, family history, social history, health maintenance with the patient/caregiver today.   ROS  Ten systems reviewed and is negative except as mentioned in HPI    Objective  Vitals:   11/11/22 1045  BP: 116/66  Pulse: 98  Resp: 16  Temp: 98.2 F (36.8 C)  TempSrc: Oral  SpO2: 99%  Weight: 168 lb 4.8 oz (76.3 kg)  Height: 5\' 5"  (1.651 m)    Body mass index is 28.01 kg/m.  Physical Exam  Constitutional: Patient appears well-developed and well-nourished. No distress.  HEENT: head atraumatic, normocephalic, pupils equal and reactive to light, neck supple Cardiovascular: Normal rate, regular rhythm and normal heart sounds.  No murmur heard. No BLE edema. Pulmonary/Chest: Effort normal and breath sounds normal. No respiratory distress. Abdominal: Soft.   There is no tenderness. Psychiatric: Patient has a normal mood and affect. behavior is normal. Judgment and thought content normal.   Recent Results (from the past 2160 hour(s))  POCT HgB A1C     Status: Abnormal   Collection Time: 11/11/22 10:50 AM  Result Value Ref Range   Hemoglobin A1C 7.6 (A) 4.0 - 5.6 %   HbA1c POC (<> result, manual entry)     HbA1c, POC (prediabetic range)     HbA1c, POC (controlled diabetic range)       PHQ2/9:    11/11/2022   10:49 AM 07/15/2022   10:40 AM 04/28/2022   11:42 AM 03/14/2022   11:00 AM 11/10/2021   10:36 AM  Depression screen PHQ 2/9  Decreased Interest 1 0 0 0 0  Down, Depressed, Hopeless 1 0 0 0 0  PHQ - 2 Score 2 0 0 0 0  Altered sleeping 0 0  0 0  Tired, decreased energy 0 0  0 0  Change in appetite 0 0  0 0  Feeling bad or failure about yourself  0 0  0 0  Trouble concentrating 0 0  0 0  Moving slowly or fidgety/restless 0 0  0 0  Suicidal thoughts 0 0  0 0  PHQ-9 Score 2 0  0 0  Difficult doing work/chores Somewhat difficult        phq 9 is positive - her neighbors dog chewed her cable    Fall Risk:    11/11/2022   11:21 AM 11/11/2022   10:46 AM 07/15/2022   10:40 AM 04/28/2022   11:36 AM 03/14/2022   10:59 AM  Fall Risk   Falls in the past year? 1 1 0 0 0  Number falls in past yr: 0 0  0   Injury with Fall? 0 0  0   Risk for fall due to : Other (Comment) History of fall(s) No Fall Risks No Fall Risks   Follow up Education provided Falls prevention discussed;Education provided;Falls evaluation completed Falls prevention discussed Education provided;Falls prevention discussed Falls prevention discussed;Education provided;Falls evaluation completed      Functional Status Survey:  Is the patient deaf or have difficulty hearing?: No Does the patient have difficulty seeing, even when wearing glasses/contacts?: No Does the patient have difficulty concentrating, remembering, or making decisions?: No Does the patient have  difficulty walking or climbing stairs?: No Does the patient have difficulty dressing or bathing?: No Does the patient have difficulty doing errands alone such as visiting a doctor's office or shopping?: No    Assessment & Plan  1. Type 2 diabetes mellitus with microalbuminuria, without long-term current use of insulin (HCC)  - POCT HgB A1C - [facility] Diabetes Foot Exam  2. Need for immunization against influenza  Up to date  3. Meningioma [facility])  Keep follow up with neurosurgeon  4. Dyslipidemia associated with type 2 diabetes mellitus (HCC)  - metFORMIN (GLUCOPHAGE) 500 MG tablet; Take 1-2 tablets (500-1,000 mg total) by mouth 2 (two) times daily with a meal.  Dispense: 270 tablet; Refill: 0 - atorvastatin (LIPITOR) 40 MG tablet; Take 1 tablet (40 mg total) by mouth daily.  Dispense: 90 tablet; Refill: 1  5. Mild protein-calorie malnutrition (HCC)  Discussed protein supplementation   6. History of recent fall  Discussed fall prevention  7. Osteopenia after menopause  Recheck 2026   8. Primary osteoarthritis of right knee   9. Type 2 diabetes mellitus with pressure callus (HCC)  - atorvastatin (LIPITOR) 40 MG tablet; Take 1 tablet (40 mg total) by mouth daily.  Dispense: 90 tablet; Refill: 1  10. Perennial allergic rhinitis  - montelukast (SINGULAIR) 10 MG tablet; Take 1 tablet (10 mg total) by mouth at bedtime.  Dispense: 90 tablet; Refill: 1

## 2022-11-11 ENCOUNTER — Encounter: Payer: Self-pay | Admitting: Family Medicine

## 2022-11-11 ENCOUNTER — Ambulatory Visit: Payer: Medicare HMO | Admitting: Family Medicine

## 2022-11-11 VITALS — BP 116/66 | HR 98 | Temp 98.2°F | Resp 16 | Ht 65.0 in | Wt 168.3 lb

## 2022-11-11 DIAGNOSIS — Z9181 History of falling: Secondary | ICD-10-CM

## 2022-11-11 DIAGNOSIS — E441 Mild protein-calorie malnutrition: Secondary | ICD-10-CM

## 2022-11-11 DIAGNOSIS — E11628 Type 2 diabetes mellitus with other skin complications: Secondary | ICD-10-CM

## 2022-11-11 DIAGNOSIS — Z23 Encounter for immunization: Secondary | ICD-10-CM | POA: Diagnosis not present

## 2022-11-11 DIAGNOSIS — M1711 Unilateral primary osteoarthritis, right knee: Secondary | ICD-10-CM

## 2022-11-11 DIAGNOSIS — E1129 Type 2 diabetes mellitus with other diabetic kidney complication: Secondary | ICD-10-CM

## 2022-11-11 DIAGNOSIS — E1169 Type 2 diabetes mellitus with other specified complication: Secondary | ICD-10-CM | POA: Diagnosis not present

## 2022-11-11 DIAGNOSIS — J3089 Other allergic rhinitis: Secondary | ICD-10-CM

## 2022-11-11 DIAGNOSIS — M8580 Other specified disorders of bone density and structure, unspecified site: Secondary | ICD-10-CM

## 2022-11-11 DIAGNOSIS — Z780 Asymptomatic menopausal state: Secondary | ICD-10-CM

## 2022-11-11 DIAGNOSIS — D329 Benign neoplasm of meninges, unspecified: Secondary | ICD-10-CM

## 2022-11-11 DIAGNOSIS — R809 Proteinuria, unspecified: Secondary | ICD-10-CM

## 2022-11-11 DIAGNOSIS — E785 Hyperlipidemia, unspecified: Secondary | ICD-10-CM

## 2022-11-11 LAB — POCT GLYCOSYLATED HEMOGLOBIN (HGB A1C): Hemoglobin A1C: 7.6 % — AB (ref 4.0–5.6)

## 2022-11-11 MED ORDER — METFORMIN HCL 500 MG PO TABS: 500.0000 mg | ORAL_TABLET | Freq: Two times a day (BID) | ORAL | 0 refills | Status: DC

## 2022-11-11 MED ORDER — MONTELUKAST SODIUM 10 MG PO TABS
10.0000 mg | ORAL_TABLET | Freq: Every day | ORAL | 1 refills | Status: DC
Start: 2022-11-11 — End: 2023-07-21

## 2022-11-11 MED ORDER — ATORVASTATIN CALCIUM 40 MG PO TABS
40.0000 mg | ORAL_TABLET | Freq: Every day | ORAL | 1 refills | Status: DC
Start: 2022-11-11 — End: 2023-07-21

## 2022-12-20 ENCOUNTER — Telehealth: Payer: Self-pay | Admitting: Family Medicine

## 2022-12-20 NOTE — Telephone Encounter (Signed)
Pt states she got the leg braces that Dr Carlynn Purl ordered for her.  She said she did know Dr Carlynn Purl was going to order. Pt states no way she can put these on, so she is going to return.  BTW, pt is on the way to the post office to return these.  Pt states she lives alone and it would be difficult for her on to put these on.

## 2022-12-20 NOTE — Telephone Encounter (Signed)
FYI

## 2022-12-21 NOTE — Telephone Encounter (Signed)
Pt verbalized understanding.

## 2023-02-09 LAB — HM DIABETES EYE EXAM

## 2023-03-16 ENCOUNTER — Other Ambulatory Visit: Payer: Self-pay | Admitting: Family Medicine

## 2023-03-16 DIAGNOSIS — E1169 Type 2 diabetes mellitus with other specified complication: Secondary | ICD-10-CM

## 2023-03-21 ENCOUNTER — Encounter: Payer: Self-pay | Admitting: Family Medicine

## 2023-03-21 ENCOUNTER — Ambulatory Visit (INDEPENDENT_AMBULATORY_CARE_PROVIDER_SITE_OTHER): Payer: Medicare HMO | Admitting: Family Medicine

## 2023-03-21 VITALS — BP 120/66 | HR 94 | Resp 16 | Ht 65.0 in | Wt 164.0 lb

## 2023-03-21 DIAGNOSIS — D329 Benign neoplasm of meninges, unspecified: Secondary | ICD-10-CM

## 2023-03-21 DIAGNOSIS — R809 Proteinuria, unspecified: Secondary | ICD-10-CM | POA: Diagnosis not present

## 2023-03-21 DIAGNOSIS — E785 Hyperlipidemia, unspecified: Secondary | ICD-10-CM

## 2023-03-21 DIAGNOSIS — E1129 Type 2 diabetes mellitus with other diabetic kidney complication: Secondary | ICD-10-CM | POA: Diagnosis not present

## 2023-03-21 DIAGNOSIS — Z780 Asymptomatic menopausal state: Secondary | ICD-10-CM

## 2023-03-21 DIAGNOSIS — E441 Mild protein-calorie malnutrition: Secondary | ICD-10-CM

## 2023-03-21 DIAGNOSIS — J3089 Other allergic rhinitis: Secondary | ICD-10-CM

## 2023-03-21 DIAGNOSIS — M8580 Other specified disorders of bone density and structure, unspecified site: Secondary | ICD-10-CM

## 2023-03-21 DIAGNOSIS — E1169 Type 2 diabetes mellitus with other specified complication: Secondary | ICD-10-CM

## 2023-03-21 DIAGNOSIS — E113553 Type 2 diabetes mellitus with stable proliferative diabetic retinopathy, bilateral: Secondary | ICD-10-CM | POA: Diagnosis not present

## 2023-03-21 DIAGNOSIS — Z862 Personal history of diseases of the blood and blood-forming organs and certain disorders involving the immune mechanism: Secondary | ICD-10-CM

## 2023-03-21 DIAGNOSIS — E538 Deficiency of other specified B group vitamins: Secondary | ICD-10-CM

## 2023-03-21 LAB — POCT GLYCOSYLATED HEMOGLOBIN (HGB A1C): Hemoglobin A1C: 7.4 % — AB (ref 4.0–5.6)

## 2023-03-21 NOTE — Progress Notes (Signed)
 Name: Beverly [name]   MRN: [id number]    DOB: 11-25-1934   Date:03/21/2023       Progress Note  Subjective  Chief Complaint  Chief Complaint  Patient presents with   Medical Management of Chronic Issues   HPI   DMII: she is now taking Metformin to 500  mg daily,  taking Prandin two times a day before her meals, today A1C is down from 7.6 % to 7.4 % today.   Fasting  is usually 95-105  and post prandially around  150 , she is willing to go up on Metformin to three pills daily and monitor .  Eye exam is up to date -  she has proliferative diabetic retinopathy and also macular edema of right eye, sees podiatrist for callus and has recurrence of bunion on left foot,  she has a history of microalbuminuria but is on ARB and last level normalized, she is off ARB due to low BP, we will recheck labs today . She denies polyphagia or polydipsia or polyuria    Meningioma: History of syncopal episode: in 2020 seen by cardiologist, Dr. Graciela Husbands, echo showed normal EF. Possibly from change in bp medication because of shortage. She has not had any more episodes Had sub-dural hematoma, she had a repeat MRI done 08/2019 and showed a very mild increase in size of meningioma, repeat MRI done 11/22 but Dr. Adriana Simas left and she is now seeing Dr. Marcell Barlow and since very mild change no longer repeating imaging but to notify him if any symptoms such as headaches, double vision or balance problems. Doing well.    IMPRESSION - MRI brain done 11/22 1. Minimal enlargement of a left paracentral planum sphenoidale meningioma since 2020, now up to 2.9 cm. Regional mass effect is stable. Mild vasogenic edema in the left inferior frontal gyrus has increased since 2020 but is stable from last year.   2. No new intracranial abnormality. 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   Hyperlipidemia: taking Atorvastatin, no myalgias. Last LDL was at goal done 03/2022 .  We will recheck labs    AR: well controlled, taking loratadine prn and singulair daily , no rhinorrhea or nasal congestion .    OA:  it started after a fall 35 plus years ago secondary to a fall, initially right knee, but now seems to be worse on the left side. Symptoms are stable  She is still seeing chiropractor - Dr. Chriss Czar , doing well    Unintentional weight loss/Protein malnutrition : she had lost 18 lbs from 2018 to 2019 .  She states she was under a lot of stress since Summer of 2018 worried about grand-daughter  also her daughter was diagnosed with uterine cancer. Since she lost weight, she got motivated to eat healthier, she fell  Summer 2020 and broke her dentures during a syncopal episode.  Weight was stable in the 170 lbs, however gradually going down again down to 164 lbs today. She states she has normal appetite but eats smaller portions, she is eating protein shakes and healthy meals. She denies abdominal pain, early satiety or change in bowel movements  Vitamin B12 deficiency: we will recheck labs    Osteopenia: last vitamin D was good , discussed high calcium diet . She stopped taking Alendronate since she took  it for over 5 years.   Last bone density done 03/2022 some progression of osteopenia on femur   She lives alone, still  drivers, cooks and is independent , she has an emergency alert   Patient Active Problem List   Diagnosis Date Noted   B12 deficiency 11/10/2021   Mild protein-calorie malnutrition (HCC) 11/10/2021   History of subdural hematoma 11/10/2021   Meningioma (HCC) 11/10/2020   Iron deficiency anemia    Type 2 diabetes mellitus with stable proliferative retinopathy of both eyes, without long-term current use of insulin (HCC) 02/27/2018   Type 2 diabetes mellitus with pressure callus (HCC) 10/24/2017   Diabetic retinopathy (HCC) 11/12/2015   Osteoarthritis 06/02/2015   Osteopenia after menopause 06/02/2015   Narrowing of intervertebral disc space  06/02/2015   Dyslipidemia associated with type 2 diabetes mellitus (HCC) 06/02/2015   Perennial allergic rhinitis 02/28/2007   Essential (primary) hypertension 06/26/2006   Pure hypercholesterolemia 06/26/2006    Past Surgical History:  Procedure Laterality Date   ABDOMINAL HYSTERECTOMY     BUNIONECTOMY     COLONOSCOPY     COLONOSCOPY WITH PROPOFOL N/A 09/24/2018   Procedure: COLONOSCOPY WITH PROPOFOL;  Surgeon: Toney Reil, MD;  Location: [facility] ENDOSCOPY;  Service: Gastroenterology;  Laterality: N/A;   ESOPHAGOGASTRODUODENOSCOPY (EGD) WITH PROPOFOL N/A 09/24/2018   Procedure: ESOPHAGOGASTRODUODENOSCOPY (EGD) WITH PROPOFOL;  Surgeon: Toney Reil, MD;  Location: [facility] ENDOSCOPY;  Service: Gastroenterology;  Laterality: N/A;   GIVENS CAPSULE STUDY N/A 10/11/2018   Procedure: GIVENS CAPSULE STUDY;  Surgeon: Toney Reil, MD;  Location: [facility] ENDOSCOPY;  Service: Gastroenterology;  Laterality: N/A;   HERNIA REPAIR      Family History  Problem Relation Age of Onset   Hypertension Mother    Healthy Father    Healthy Daughter    Raynaud syndrome Daughter    Hypertension Daughter    Stroke Sister     Social History   Tobacco Use   Smoking status: Never   Smokeless tobacco: Never   Tobacco comments:    smoking cessation materials not required  Substance Use Topics   Alcohol use: No    Alcohol/week: 0.0 standard drinks of alcohol     Current Outpatient Medications:    acetaminophen (TYLENOL) 650 MG CR tablet, Take 1 tablet by mouth every 6 (six) hours as needed., Disp: , Rfl:    atorvastatin (LIPITOR) 40 MG tablet, Take 1 tablet (40 mg total) by mouth daily., Disp: 90 tablet, Rfl: 1   Cholecalciferol (VITAMIN D) 2000 units CAPS, Take 1 capsule (2,000 Units total) by mouth daily., Disp: 30 capsule, Rfl: 0   ferrous sulfate 325 (65 FE) MG tablet, Take 325 mg by mouth 2 (two) times daily with a meal. , Disp: , Rfl:    Glucosamine Sulfate 500 MG TABS, Take 1 tablet  by mouth once a week. , Disp: , Rfl:    loratadine (CLARITIN) 10 MG tablet, Take 1 tablet (10 mg total) by mouth daily., Disp: 90 tablet, Rfl: 1   metFORMIN (GLUCOPHAGE) 500 MG tablet, Take 1-2 tablets (500-1,000 mg total) by mouth 2 (two) times daily with a meal., Disp: 270 tablet, Rfl: 0   montelukast (SINGULAIR) 10 MG tablet, Take 1 tablet (10 mg total) by mouth at bedtime., Disp: 90 tablet, Rfl: 1   Multiple Vitamin (MULTI-VITAMINS) TABS, Take by mouth., Disp: , Rfl:    repaglinide (PRANDIN) 2 MG tablet, TAKE 1 TABLET (2 MG TOTAL) BY MOUTH 2 (TWO) TIMES DAILY BEFORE A MEAL., Disp: 180 tablet, Rfl: 1   vitamin B-12 (CYANOCOBALAMIN) 1000 MCG tablet, Take 1,000 mcg by mouth 3 (three) times a week., Disp: ,  Rfl:    vitamin C (ASCORBIC ACID) 500 MG tablet, Take 500 mg by mouth daily., Disp: , Rfl:    vitamin E 180 MG (400 UNITS) capsule, Take 400 Units by mouth daily., Disp: , Rfl:    Zinc Acetate 50 MG CAPS, Take by mouth daily. , Disp: , Rfl:   Allergies  Allergen Reactions   Lisinopril     I personally reviewed active problem list, medication list, allergies, family history with the patient/caregiver today.   ROS  Ten systems reviewed and is negative except as mentioned in HPI    Objective  Vitals:   03/21/23 1052  BP: 120/66  Pulse: 94  Resp: 16  SpO2: 100%  Weight: 164 lb (74.4 kg)  Height: 5\' 5"  (1.651 m)    Body mass index is 27.29 kg/m.  Physical Exam  Constitutional: Patient appears well-developed and well-nourished.  No distress.  HEENT: head atraumatic, normocephalic, pupils equal and reactive to light, neck supple Cardiovascular: Normal rate, regular rhythm and normal heart sounds.  No murmur heard. No BLE edema. Pulmonary/Chest: Effort normal and breath sounds normal. No respiratory distress. Abdominal: Soft.  There is no tenderness. Psychiatric: Patient has a normal mood and affect. behavior is normal. Judgment and thought content normal.   Recent Results  (from the past 2160 hours)  POCT glycosylated hemoglobin (Hb A1C)     Status: Abnormal   Collection Time: 03/21/23 10:55 AM  Result Value Ref Range   Hemoglobin A1C 7.4 (A) 4.0 - 5.6 %   HbA1c POC (<> result, manual entry)     HbA1c, POC (prediabetic range)     HbA1c, POC (controlled diabetic range)      Diabetic Foot Exam:     PHQ2/9:    03/21/2023   10:51 AM 11/11/2022   10:49 AM 07/15/2022   10:40 AM 04/28/2022   11:42 AM 03/14/2022   11:00 AM  Depression screen PHQ 2/9  Decreased Interest 0 1 0 0 0  Down, Depressed, Hopeless 0 1 0 0 0  PHQ - 2 Score 0 2 0 0 0  Altered sleeping 0 0 0  0  Tired, decreased energy 0 0 0  0  Change in appetite 0 0 0  0  Feeling bad or failure about yourself  0 0 0  0  Trouble concentrating 0 0 0  0  Moving slowly or fidgety/restless 0 0 0  0  Suicidal thoughts 0 0 0  0  PHQ-9 Score 0 2 0  0  Difficult doing work/chores Not difficult at all Somewhat difficult       phq 9 is negative  Fall Risk:    03/21/2023   10:51 AM 11/11/2022   11:21 AM 11/11/2022   10:46 AM 07/15/2022   10:40 AM 04/28/2022   11:36 AM  Fall Risk   Falls in the past year? 0 1 1 0 0  Number falls in past yr: 0 0 0  0  Injury with Fall? 0 0 0  0  Risk for fall due to : No Fall Risks Other (Comment) History of fall(s) No Fall Risks No Fall Risks  Follow up Falls prevention discussed;Education provided;Falls evaluation completed Education provided Falls prevention discussed;Education provided;Falls evaluation completed Falls prevention discussed Education provided;Falls prevention discussed     Assessment & Plan  1. Type 2 diabetes mellitus with microalbuminuria, without long-term current use of insulin (HCC) (Primary)  - POCT glycosylated hemoglobin (Hb A1C) - Lipid panel - Microalbumin / creatinine urine  ratio - COMPLETE METABOLIC PANEL WITH GFR  2. Dyslipidemia associated with type 2 diabetes mellitus (HCC)  - Lipid panel  3. Meningioma (HCC)  Asymptomatic    4. Type 2 diabetes mellitus with stable proliferative retinopathy of both eyes, without long-term current use of insulin (HCC)  Up to date with Eye exam and we will obtain copy of results  5. Mild protein-calorie malnutrition (HCC)  Continue protein shakes  6. B12 deficiency  - B12 and Folate Panel  7. Perennial allergic rhinitis  Doing well on medication  8. Osteopenia after menopause  - VITAMIN D 25 Hydroxy (Vit-D Deficiency, Fractures)  9. History of iron deficiency anemia  - CBC with Differential/Platelet - Iron, TIBC and Ferritin Panel

## 2023-03-21 NOTE — Addendum Note (Signed)
 Addended by: Alba Cory F on: 03/21/2023 11:22 AM   Modules accepted: Level of Service

## 2023-03-22 LAB — COMPLETE METABOLIC PANEL WITH GFR
AG Ratio: 1.4 (calc) (ref 1.0–2.5)
ALT: 9 U/L (ref 6–29)
AST: 19 U/L (ref 10–35)
Albumin: 4.1 g/dL (ref 3.6–5.1)
Alkaline phosphatase (APISO): 69 U/L (ref 37–153)
BUN: 15 mg/dL (ref 7–25)
CO2: 29 mmol/L (ref 20–32)
Calcium: 9.5 mg/dL (ref 8.6–10.4)
Chloride: 104 mmol/L (ref 98–110)
Creat: 0.91 mg/dL (ref 0.60–0.95)
Globulin: 2.9 g/dL (ref 1.9–3.7)
Glucose, Bld: 179 mg/dL — ABNORMAL HIGH (ref 65–99)
Potassium: 4 mmol/L (ref 3.5–5.3)
Sodium: 143 mmol/L (ref 135–146)
Total Bilirubin: 0.5 mg/dL (ref 0.2–1.2)
Total Protein: 7 g/dL (ref 6.1–8.1)

## 2023-03-22 LAB — LIPID PANEL
Cholesterol: 135 mg/dL (ref <200)
HDL: 60 mg/dL (ref >=50)
LDL Cholesterol (Calc): 62 mg/dL
Non-HDL Cholesterol (Calc): 75 mg/dL (ref <130)
Total CHOL/HDL Ratio: 2.3 (calc) (ref <5.0)
Triglycerides: 57 mg/dL (ref <150)

## 2023-03-22 LAB — IRON,TIBC AND FERRITIN PANEL
%SAT: 36 % (ref 16–45)
Ferritin: 31 ng/mL (ref 16–288)
Iron: 89 ug/dL (ref 45–160)
TIBC: 248 ug/dL — ABNORMAL LOW (ref 250–450)

## 2023-03-22 LAB — CBC WITH DIFFERENTIAL/PLATELET
Absolute Lymphocytes: 2739 {cells}/uL (ref 850–3900)
Absolute Monocytes: 319 {cells}/uL (ref 200–950)
Basophils Absolute: 10 {cells}/uL (ref 0–200)
Basophils Relative: 0.2 %
Eosinophils Absolute: 59 {cells}/uL (ref 15–500)
Eosinophils Relative: 1.2 %
HCT: 36.3 % (ref 35.0–45.0)
Hemoglobin: 12.1 g/dL (ref 11.7–15.5)
MCH: 31.3 pg (ref 27.0–33.0)
MCHC: 33.3 g/dL (ref 32.0–36.0)
MCV: 94 fL (ref 80.0–100.0)
MPV: 12.4 fL (ref 7.5–12.5)
Monocytes Relative: 6.5 %
Neutro Abs: 1774 {cells}/uL (ref 1500–7800)
Neutrophils Relative %: 36.2 %
Platelets: 193 10*3/uL (ref 140–400)
RBC: 3.86 10*6/uL (ref 3.80–5.10)
RDW: 12.9 % (ref 11.0–15.0)
Total Lymphocyte: 55.9 %
WBC: 4.9 10*3/uL (ref 3.8–10.8)

## 2023-03-22 LAB — MICROALBUMIN / CREATININE URINE RATIO
Creatinine, Urine: 158 mg/dL (ref 20–275)
Microalb Creat Ratio: 29 mg/g{creat} (ref <30)
Microalb, Ur: 4.6 mg/dL

## 2023-03-22 LAB — B12 AND FOLATE PANEL
Folate: >24 ng/mL
Vitamin B-12: 1449 pg/mL — ABNORMAL HIGH (ref 200–1100)

## 2023-03-22 LAB — VITAMIN D 25 HYDROXY (VIT D DEFICIENCY, FRACTURES): Vit D, 25-Hydroxy: 74 ng/mL (ref 30–100)

## 2023-05-03 ENCOUNTER — Other Ambulatory Visit: Payer: Self-pay | Admitting: Family Medicine

## 2023-05-03 DIAGNOSIS — E1169 Type 2 diabetes mellitus with other specified complication: Secondary | ICD-10-CM

## 2023-05-04 ENCOUNTER — Ambulatory Visit: Payer: Self-pay

## 2023-05-04 VITALS — BP 122/60 | Ht 65.0 in | Wt 163.4 lb

## 2023-05-04 DIAGNOSIS — Z0000 Encounter for general adult medical examination without abnormal findings: Secondary | ICD-10-CM

## 2023-05-04 NOTE — Patient Instructions (Addendum)
 Beverly [name] , Thank you for taking time to come for your Medicare Wellness Visit. I appreciate your ongoing commitment to your health goals. Please review the following plan we discussed and let me know if I can assist you in the future.   Referrals/Orders/Follow-Ups/Clinician Recommendations: NONE  This is a list of the screening recommended for you and due dates:  Health Maintenance  Topic Date Due   COVID-19 Vaccine (6 - Pfizer risk 2024-25 season) 04/06/2023   Flu Shot  08/04/2023   Hemoglobin A1C  09/21/2023   Complete foot exam   11/11/2023   Eye exam for diabetics  02/09/2024   Medicare Annual Wellness Visit  05/03/2024   DTaP/Tdap/Td vaccine (3 - Td or Tdap) 07/23/2028   Pneumonia Vaccine  Completed   DEXA scan (bone density measurement)  Completed   Zoster (Shingles) Vaccine  Completed   HPV Vaccine  Aged Out   Meningitis B Vaccine  Aged Out    Advanced directives: (ACP Link)Information on Advanced Care Planning can be found at [place]  Secretary of [facility] Advance Health Care Directives Advance Health Care Directives. [email or web address]   Next Medicare Annual Wellness Visit scheduled for next year: Yes  05/16/24 @ 10:10 AM IN PERSON

## 2023-05-04 NOTE — Progress Notes (Signed)
 Subjective:   Beverly [name] is a 89 y.o. who presents for a Medicare Wellness preventive visit.  Visit Complete: In person  Persons Participating in Visit: Patient.  AWV Questionnaire: No: Patient Medicare AWV questionnaire was not completed prior to this visit.  Cardiac Risk Factors include: advanced age (>73men, >35 women);dyslipidemia;hypertension;diabetes mellitus     Objective:    Today's Vitals   05/04/23 1054  BP: 122/60  Weight: 163 lb 6.4 oz (74.1 kg)  Height: 5\' 5"  (1.651 m)   Body mass index is 27.19 kg/m.     05/04/2023   11:06 AM 04/28/2022   11:45 AM 04/20/2021    9:44 AM 04/16/2020    9:29 AM 04/16/2019    9:45 AM 09/24/2018    8:34 AM 07/24/2018    1:17 PM  Advanced Directives  Does Patient Have a Medical Advance Directive? No Yes Yes Yes Yes Yes No  Type of Special educational needs teacher of [place];Living will Healthcare Power of [place];Living will Healthcare Power of [place];Living will Healthcare Power of [place];Living will Healthcare Power of [place];Living will   Does patient want to make changes to medical advance directive?    Yes (MAU/Ambulatory/Procedural Areas - Information given)     Copy of Healthcare Power of Attorney in Chart?   No - copy requested No - copy requested No - copy requested No - copy requested   Would patient like information on creating a medical advance directive? No - Patient declined      No - Patient declined    Current Medications (verified) Outpatient Encounter Medications as of 05/04/2023  Medication Sig   acetaminophen  (TYLENOL ) 650 MG CR tablet Take 1 tablet by mouth every 6 (six) hours as needed.   atorvastatin  (LIPITOR) 40 MG tablet Take 1 tablet (40 mg total) by mouth daily.   Cholecalciferol (VITAMIN D ) 2000 units CAPS Take 1 capsule (2,000 Units total) by mouth daily.   ferrous sulfate  325 (65 FE) MG tablet Take 325 mg by mouth 2 (two) times daily with a meal.    Glucosamine Sulfate 500 MG TABS Take 1  tablet by mouth once a week.    metFORMIN  (GLUCOPHAGE ) 500 MG tablet TAKE 1 TABLET BY MOUTH TWICE A DAY WITH FOOD   montelukast  (SINGULAIR ) 10 MG tablet Take 1 tablet (10 mg total) by mouth at bedtime.   Multiple Vitamin (MULTI-VITAMINS) TABS Take by mouth.   repaglinide  (PRANDIN ) 2 MG tablet TAKE 1 TABLET (2 MG TOTAL) BY MOUTH 2 (TWO) TIMES DAILY BEFORE A MEAL.   vitamin B-12 (CYANOCOBALAMIN ) 1000 MCG tablet Take 1,000 mcg by mouth 3 (three) times a week.   vitamin C  (ASCORBIC ACID ) 500 MG tablet Take 500 mg by mouth daily.   vitamin E 180 MG (400 UNITS) capsule Take 400 Units by mouth daily.   Zinc Acetate 50 MG CAPS Take by mouth daily.    loratadine  (CLARITIN ) 10 MG tablet Take 1 tablet (10 mg total) by mouth daily.   No facility-administered encounter medications on file as of 05/04/2023.    Allergies (verified) Lisinopril   History: Past Medical History:  Diagnosis Date   Allergy    Arthritis    Diabetes mellitus without complication (HCC)    Hyperlipidemia    Hypertension    Past Surgical History:  Procedure Laterality Date   ABDOMINAL HYSTERECTOMY     BUNIONECTOMY     COLONOSCOPY     COLONOSCOPY WITH PROPOFOL  N/A 09/24/2018   Procedure: COLONOSCOPY WITH PROPOFOL ;  Surgeon: Selena Daily, MD;  Location: [facility] ENDOSCOPY;  Service: Gastroenterology;  Laterality: N/A;   ESOPHAGOGASTRODUODENOSCOPY (EGD) WITH PROPOFOL  N/A 09/24/2018   Procedure: ESOPHAGOGASTRODUODENOSCOPY (EGD) WITH PROPOFOL ;  Surgeon: Selena Daily, MD;  Location: [facility] ENDOSCOPY;  Service: Gastroenterology;  Laterality: N/A;   GIVENS CAPSULE STUDY N/A 10/11/2018   Procedure: GIVENS CAPSULE STUDY;  Surgeon: Selena Daily, MD;  Location: [facility] ENDOSCOPY;  Service: Gastroenterology;  Laterality: N/A;   HERNIA REPAIR     Family History  Problem Relation Age of Onset   Hypertension Mother    Healthy Father    Healthy Daughter    Raynaud syndrome Daughter    Hypertension Daughter    Stroke Sister     Social History   Socioeconomic History   Marital status: Widowed    Spouse name: Ace Abu   Number of children: 2   Years of education: Not on file   Highest education level: Bachelor's degree (e.g., BA, AB, BS)  Occupational History   Occupation: Retired  Tobacco Use   Smoking status: Never   Smokeless tobacco: Never   Tobacco comments:    smoking cessation materials not required  Vaping Use   Vaping status: Never Used  Substance and Sexual Activity   Alcohol use: No    Alcohol/week: 0.0 standard drinks of alcohol   Drug use: No   Sexual activity: Not Currently  Other Topics Concern   Not on file  Social History Narrative   Pt lives alone.    Social Drivers of Corporate investment banker Strain: Low Risk  (05/04/2023)   Overall Financial Resource Strain (CARDIA)    Difficulty of Paying Living Expenses: Not hard at all  Food Insecurity: No Food Insecurity (05/04/2023)   Hunger Vital Sign    Worried About Running Out of Food in the Last Year: Never true    Ran Out of Food in the Last Year: Never true  Transportation Needs: No Transportation Needs (05/04/2023)   PRAPARE - Administrator, Civil Service (Medical): No    Lack of Transportation (Non-Medical): No  Physical Activity: Insufficiently Active (05/04/2023)   Exercise Vital Sign    Days of Exercise per Week: 3 days    Minutes of Exercise per Session: 40 min  Stress: No Stress Concern Present (05/04/2023)   [employer] of Occupational Health - Occupational Stress Questionnaire    Feeling of Stress : Not at all  Social Connections: Moderately Integrated (05/04/2023)   Social Connection and Isolation Panel [NHANES]    Frequency of Communication with Friends and Family: More than three times a week    Frequency of Social Gatherings with Friends and Family: Twice a week    Attends Religious Services: More than 4 times per year    Active Member of [employer] or Organizations: Yes    Attends Banker  Meetings: More than 4 times per year    Marital Status: Widowed    Tobacco Counseling Counseling given: Not Answered Tobacco comments: smoking cessation materials not required    Clinical Intake:  Pre-visit preparation completed: Yes  Pain : No/denies pain     BMI - recorded: 27.19 Nutritional Status: BMI 25 -29 Overweight Nutritional Risks: None Diabetes: Yes CBG done?: No Did pt. bring in CBG monitor from home?: No  Lab Results  Component Value Date   HGBA1C 7.4 (A) 03/21/2023   HGBA1C 7.6 (A) 11/11/2022   HGBA1C 7.2 (A) 07/15/2022     How often do you  need to have someone help you when you read instructions, pamphlets, or other written materials from your doctor or pharmacy?: 1 - Never  Interpreter Needed?: No  Information entered by :: Dellie Fergusson, LPN   Activities of Daily Living    05/04/2023   11:07 AM 11/11/2022   10:46 AM  In your present state of health, do you have any difficulty performing the following activities:  Hearing? 0 0  Vision? 0 0  Difficulty concentrating or making decisions? 0 0  Walking or climbing stairs? 0 0  Dressing or bathing? 0 0  Doing errands, shopping? 0 0  Preparing Food and eating ? N   Using the Toilet? N   In the past six months, have you accidently leaked urine? N   Do you have problems with loss of bowel control? N   Managing your Medications? N   Managing your Finances? N   Housekeeping or managing your Housekeeping? Y     Patient Care Team: Sowles, Krichna, MD as PCP - General (Family Medicine) Angel Barba, DPM as Consulting Physician (Podiatry) Baldomero Bone, Elson Halon, MD as Consulting Physician (Gastroenterology) Verona Goodwill, MD as Consulting Physician (Cardiology) Berta Brittle, MD as Consulting Physician (Neurosurgery) Rosa College, MD as Consulting Physician (Ophthalmology)  Indicate any recent Medical Services you may have received from other than [facility] providers in the past year (date may be  approximate).     Assessment:   This is a routine wellness examination for [facility]  Hearing/Vision screen Hearing Screening - Comments:: NO AIDS Vision Screening - Comments:: READERS- DR.KING- GOES Q6 MONTHS, NEXT DUE IN AUGUST   Goals Addressed             This Visit's Progress    DIET - EAT MORE FRUITS AND VEGETABLES         Depression Screen     05/04/2023   11:03 AM 03/21/2023   10:51 AM 11/11/2022   10:49 AM 07/15/2022   10:40 AM 04/28/2022   11:42 AM 03/14/2022   11:00 AM 11/10/2021   10:36 AM  PHQ 2/9 Scores  PHQ - 2 Score 0 0 2 0 0 0 0  PHQ- 9 Score 0 0 2 0  0 0    Fall Risk     05/04/2023   11:07 AM 03/21/2023   10:51 AM 11/11/2022   11:21 AM 11/11/2022   10:46 AM 07/15/2022   10:40 AM  Fall Risk   Falls in the past year? 0 0 1 1 0  Number falls in past yr: 0 0 0 0   Injury with Fall? 0 0 0 0   Risk for fall due to : Impaired balance/gait;History of fall(s) No Fall Risks Other (Comment) History of fall(s) No Fall Risks  Follow up Falls prevention discussed;Falls evaluation completed Falls prevention discussed;Education provided;Falls evaluation completed Education provided Falls prevention discussed;Education provided;Falls evaluation completed Falls prevention discussed    MEDICARE RISK AT HOME:  Medicare Risk at Home Any stairs in or around the home?: Yes If so, are there any without handrails?: No Home free of loose throw rugs in walkways, pet beds, electrical cords, etc?: Yes Adequate lighting in your home to reduce risk of falls?: Yes Life alert?: Yes Use of a cane, walker or w/c?: No Grab bars in the bathroom?: Yes Shower chair or bench in shower?: Yes Elevated toilet seat or a handicapped toilet?: Yes  TIMED UP AND GO:  Was the test performed?  Yes  Length of time  to ambulate 10 feet: 6 sec Gait slow and steady without use of assistive device  Cognitive Function: 6CIT completed        05/04/2023   11:11 AM 04/28/2022   11:46 AM 04/16/2020     9:32 AM 04/16/2019    9:48 AM 03/28/2017    1:26 PM  6CIT Screen  What Year? 0 points 0 points 0 points 0 points 0 points  What month? 0 points 0 points 0 points 0 points 0 points  What time? 0 points 0 points 0 points 0 points 0 points  Count back from 20 0 points 0 points 0 points 0 points 0 points  Months in reverse 0 points 0 points 0 points 0 points 0 points  Repeat phrase 0 points 0 points 0 points 0 points 0 points  Total Score 0 points 0 points 0 points 0 points 0 points    Immunizations Immunization History  Administered Date(s) Administered   Fluad Quad(high Dose 65+) 09/05/2018, 09/06/2019, 11/10/2021, 08/19/2022   Influenza Split 01/27/2003, 10/16/2006, 10/25/2007, 09/03/2009   Influenza, High Dose Seasonal PF 10/06/2015, 09/25/2017, 10/22/2020   Influenza, Seasonal, Injecte, Preservative Fre 09/28/2010, 10/03/2011   Influenza,inj,Quad PF,6+ Mos 09/17/2013, 10/14/2016   Influenza-Unspecified 09/17/2013   PFIZER Comirnaty(Gray Top)Covid-19 Tri-Sucrose Vaccine 02/18/2020   PFIZER(Purple Top)SARS-COV-2 Vaccination 01/24/2019, 02/14/2019   Pfizer Covid-19 Vaccine Bivalent Booster 90yrs & up 11/11/2020   Pfizer(Comirnaty)Fall Seasonal Vaccine 12 years and older 10/06/2022   Pneumococcal Conjugate-13 09/17/2013   Pneumococcal Polysaccharide-23 01/03/2001, 11/01/2012   Tdap 05/25/2010, 07/24/2018   Zoster Recombinant(Shingrix) 10/23/2019, 12/20/2019   Zoster, Live 05/26/2010    Screening Tests Health Maintenance  Topic Date Due   COVID-19 Vaccine (6 - Pfizer risk 2024-25 season) 04/06/2023   INFLUENZA VACCINE  08/04/2023   HEMOGLOBIN A1C  09/21/2023   FOOT EXAM  11/11/2023   OPHTHALMOLOGY EXAM  02/09/2024   Medicare Annual Wellness (AWV)  05/03/2024   DTaP/Tdap/Td (3 - Td or Tdap) 07/23/2028   Pneumonia Vaccine 51+ Years old  Completed   DEXA SCAN  Completed   Zoster Vaccines- Shingrix  Completed   HPV VACCINES  Aged Out   Meningococcal B Vaccine  Aged Out     Health Maintenance  Health Maintenance Due  Topic Date Due   COVID-19 Vaccine (6 - Pfizer risk 2024-25 season) 04/06/2023   Health Maintenance Items Addressed: UP TO DATE ON SHOTS  Additional Screening:  Vision Screening: Recommended annual ophthalmology exams for early detection of glaucoma and other disorders of the eye.  Dental Screening: Recommended annual dental exams for proper oral hygiene  Community Resource Referral / Chronic Care Management: CRR required this visit?  No   CCM required this visit?  No     Plan:     I have personally reviewed and noted the following in the patient's chart:   Medical and social history Use of alcohol, tobacco or illicit drugs  Current medications and supplements including opioid prescriptions. Patient is not currently taking opioid prescriptions. Functional ability and status Nutritional status Physical activity Advanced directives List of other physicians Hospitalizations, surgeries, and ER visits in previous 12 months Vitals Screenings to include cognitive, depression, and falls Referrals and appointments  In addition, I have reviewed and discussed with patient certain preventive protocols, quality metrics, and best practice recommendations. A written personalized care plan for preventive services as well as general preventive health recommendations were provided to patient.     Pinky Bright, LPN   3/0/8657   After Visit  Summary: (In Person-Declined) Patient declined AVS at this time.  Notes: Nothing significant to report at this time.

## 2023-07-21 ENCOUNTER — Ambulatory Visit: Admitting: Family Medicine

## 2023-07-21 ENCOUNTER — Encounter: Payer: Self-pay | Admitting: Family Medicine

## 2023-07-21 VITALS — BP 132/76 | HR 92 | Resp 16 | Ht 65.0 in | Wt 160.2 lb

## 2023-07-21 DIAGNOSIS — R634 Abnormal weight loss: Secondary | ICD-10-CM

## 2023-07-21 DIAGNOSIS — E1169 Type 2 diabetes mellitus with other specified complication: Secondary | ICD-10-CM

## 2023-07-21 DIAGNOSIS — D329 Benign neoplasm of meninges, unspecified: Secondary | ICD-10-CM | POA: Diagnosis not present

## 2023-07-21 DIAGNOSIS — J3089 Other allergic rhinitis: Secondary | ICD-10-CM

## 2023-07-21 DIAGNOSIS — Z780 Asymptomatic menopausal state: Secondary | ICD-10-CM

## 2023-07-21 DIAGNOSIS — E1129 Type 2 diabetes mellitus with other diabetic kidney complication: Secondary | ICD-10-CM | POA: Diagnosis not present

## 2023-07-21 DIAGNOSIS — E113553 Type 2 diabetes mellitus with stable proliferative diabetic retinopathy, bilateral: Secondary | ICD-10-CM

## 2023-07-21 DIAGNOSIS — E785 Hyperlipidemia, unspecified: Secondary | ICD-10-CM

## 2023-07-21 DIAGNOSIS — R809 Proteinuria, unspecified: Secondary | ICD-10-CM

## 2023-07-21 DIAGNOSIS — M8580 Other specified disorders of bone density and structure, unspecified site: Secondary | ICD-10-CM

## 2023-07-21 DIAGNOSIS — Z862 Personal history of diseases of the blood and blood-forming organs and certain disorders involving the immune mechanism: Secondary | ICD-10-CM

## 2023-07-21 LAB — POCT GLYCOSYLATED HEMOGLOBIN (HGB A1C): Hemoglobin A1C: 7.2 % — AB (ref 4.0–5.6)

## 2023-07-21 MED ORDER — MONTELUKAST SODIUM 10 MG PO TABS: 10.0000 mg | ORAL_TABLET | Freq: Every day | ORAL | 1 refills | Status: DC

## 2023-07-21 MED ORDER — PIOGLITAZONE HCL 15 MG PO TABS: 15.0000 mg | ORAL_TABLET | Freq: Every day | ORAL | 1 refills | Status: DC

## 2023-07-21 MED ORDER — REPAGLINIDE 2 MG PO TABS: 2.0000 mg | ORAL_TABLET | Freq: Two times a day (BID) | ORAL | 1 refills | Status: DC

## 2023-07-21 MED ORDER — METFORMIN HCL 500 MG PO TABS: 500.0000 mg | ORAL_TABLET | Freq: Every day | ORAL | 1 refills | Status: DC

## 2023-07-21 MED ORDER — ATORVASTATIN CALCIUM 40 MG PO TABS: 40.0000 mg | ORAL_TABLET | Freq: Every day | ORAL | 1 refills | Status: DC

## 2023-07-21 NOTE — Progress Notes (Signed)
 Name: Beverly [name]   MRN: [id number]    DOB: 16-Jan-1934   Date:07/21/2023       Progress Note  Subjective  Chief Complaint  Chief Complaint  Patient presents with   Medical Management of Chronic Issues   Discussed the use of AI scribe software for clinical note transcription with the patient, who gave verbal consent to proceed.  History of Present Illness Beverly [name] is an 89 year old female with type 2 diabetes, dyslipidemia, and chronic kidney disease who presents for a routine follow-up visit.  There have been no significant changes in her health since her last visit in March. She continues to live independently, performing daily activities such as cooking and cleaning, with her memory remaining intact.  Her type 2 diabetes is managed with metformin  500 mg twice daily and repaglinide  2 mg before two meals a day. Her HbA1c has decreased from 7.4% to 7.2%. She has adjusted her diet by eliminating certain foods like fruit oatmeal and mixed nuts, opting for Raisin Bran with fruit and roasted peanuts instead. She has experienced a weight loss of 12 pounds over the past year, from 172 lbs to 160 lbs, and does not want to lose more weight. She has a history of proliferative retinopathy associated with her diabetes and continues to see her eye doctor regularly, with no new symptoms related to her eyes.  Her dyslipidemia is managed with atorvastatin  40 mg daily, and she reports no side effects from this medication. Her cholesterol levels were noted to be fine during her last blood work.  She has chronic kidney disease with previously positive proteinuria, which has since normalized.  She takes montelukast  for allergies and reports no issues with cough, sneezing, or runny nose. She takes this medication daily for both indoor and outdoor allergies.  She has a history of osteopenia, with a bone density scan last year showing some bone loss. She maintains a high calcium  diet and takes vitamin D   supplements.  She has a history of a sphenoidal meningioma, which was stable on her last imaging in 2022. No symptoms such as headaches, double vision, or balance issues. Her last neurosurgical follow-up was in March 2023.    Patient Active Problem List   Diagnosis Date Noted   B12 deficiency 11/10/2021   Mild protein-calorie malnutrition (HCC) 11/10/2021   History of subdural hematoma 11/10/2021   Meningioma (HCC) 11/10/2020   Iron deficiency anemia    Type 2 diabetes mellitus with stable proliferative retinopathy of both eyes, without long-term current use of insulin  (HCC) 02/27/2018   Type 2 diabetes mellitus with pressure callus (HCC) 10/24/2017   Diabetic retinopathy (HCC) 11/12/2015   Osteoarthritis 06/02/2015   Osteopenia after menopause 06/02/2015   Narrowing of intervertebral disc space 06/02/2015   Dyslipidemia associated with type 2 diabetes mellitus (HCC) 06/02/2015   Perennial allergic rhinitis 02/28/2007   Essential (primary) hypertension 06/26/2006   Pure hypercholesterolemia 06/26/2006    Past Surgical History:  Procedure Laterality Date   ABDOMINAL HYSTERECTOMY     BUNIONECTOMY     COLONOSCOPY     COLONOSCOPY WITH PROPOFOL  N/A 09/24/2018   Procedure: COLONOSCOPY WITH PROPOFOL ;  Surgeon: Unk Corinn Skiff, MD;  Location: [facility] ENDOSCOPY;  Service: Gastroenterology;  Laterality: N/A;   ESOPHAGOGASTRODUODENOSCOPY (EGD) WITH PROPOFOL  N/A 09/24/2018   Procedure: ESOPHAGOGASTRODUODENOSCOPY (EGD) WITH PROPOFOL ;  Surgeon: Unk Corinn Skiff, MD;  Location: [facility] ENDOSCOPY;  Service: Gastroenterology;  Laterality: N/A;   GIVENS CAPSULE STUDY N/A 10/11/2018   Procedure: GIVENS  CAPSULE STUDY;  Surgeon: Unk Corinn Skiff, MD;  Location: [facility] ENDOSCOPY;  Service: Gastroenterology;  Laterality: N/A;   HERNIA REPAIR      Family History  Problem Relation Age of Onset   Hypertension Mother    Healthy Father    Healthy Daughter    Raynaud syndrome Daughter    Hypertension  Daughter    Stroke Sister     Social History   Tobacco Use   Smoking status: Never   Smokeless tobacco: Never   Tobacco comments:    smoking cessation materials not required  Substance Use Topics   Alcohol use: No    Alcohol/week: 0.0 standard drinks of alcohol     Current Outpatient Medications:    acetaminophen  (TYLENOL ) 650 MG CR tablet, Take 1 tablet by mouth every 6 (six) hours as needed., Disp: , Rfl:    atorvastatin  (LIPITOR) 40 MG tablet, Take 1 tablet (40 mg total) by mouth daily., Disp: 90 tablet, Rfl: 1   Cholecalciferol (VITAMIN D ) 2000 units CAPS, Take 1 capsule (2,000 Units total) by mouth daily., Disp: 30 capsule, Rfl: 0   ferrous sulfate  325 (65 FE) MG tablet, Take 325 mg by mouth 2 (two) times daily with a meal. , Disp: , Rfl:    Glucosamine Sulfate 500 MG TABS, Take 1 tablet by mouth once a week. , Disp: , Rfl:    loratadine  (CLARITIN ) 10 MG tablet, Take 1 tablet (10 mg total) by mouth daily., Disp: 90 tablet, Rfl: 1   metFORMIN  (GLUCOPHAGE ) 500 MG tablet, TAKE 1 TABLET BY MOUTH TWICE A DAY WITH FOOD, Disp: 180 tablet, Rfl: 1   montelukast  (SINGULAIR ) 10 MG tablet, Take 1 tablet (10 mg total) by mouth at bedtime., Disp: 90 tablet, Rfl: 1   Multiple Vitamin (MULTI-VITAMINS) TABS, Take by mouth., Disp: , Rfl:    repaglinide  (PRANDIN ) 2 MG tablet, TAKE 1 TABLET (2 MG TOTAL) BY MOUTH 2 (TWO) TIMES DAILY BEFORE A MEAL., Disp: 180 tablet, Rfl: 1   vitamin B-12 (CYANOCOBALAMIN ) 1000 MCG tablet, Take 1,000 mcg by mouth 3 (three) times a week., Disp: , Rfl:    vitamin C  (ASCORBIC ACID ) 500 MG tablet, Take 500 mg by mouth daily., Disp: , Rfl:    vitamin E 180 MG (400 UNITS) capsule, Take 400 Units by mouth daily., Disp: , Rfl:    Zinc Acetate 50 MG CAPS, Take by mouth daily. , Disp: , Rfl:   Allergies  Allergen Reactions   Lisinopril     I personally reviewed active problem list, medication list, allergies with the patient/caregiver today.   ROS  Ten systems  reviewed and is negative except as mentioned in HPI    Objective Physical Exam  CONSTITUTIONAL: Patient appears well-developed and malnourished  HEENT: Head atraumatic, normocephalic, neck supple. CARDIOVASCULAR: Normal rate, regular rhythm and normal heart sounds. No murmur heard. 1 plus pitting  BLE edema. PULMONARY: Effort normal and breath sounds normal. Lungs clear to auscultation bilaterally. No respiratory distress. ABDOMINAL: There is no tenderness or distention. MUSCULOSKELETAL: slow gait PSYCHIATRIC: Patient has a normal mood and affect. Behavior is normal. Judgment and thought content normal.  Vitals:   07/21/23 1129  BP: 132/76  Pulse: 92  Resp: 16  SpO2: 99%  Weight: 160 lb 3.2 oz (72.7 kg)  Height: 5' 5 (1.651 m)    Body mass index is 26.66 kg/m.  Recent Results (from the past 2160 hours)  POCT glycosylated hemoglobin (Hb A1C)     Status: Abnormal  Collection Time: 07/21/23 11:36 AM  Result Value Ref Range   Hemoglobin A1C 7.2 (A) 4.0 - 5.6 %   HbA1c POC (<> result, manual entry)     HbA1c, POC (prediabetic range)     HbA1c, POC (controlled diabetic range)       PHQ2/9:    07/21/2023   11:24 AM 05/04/2023   11:03 AM 03/21/2023   10:51 AM 11/11/2022   10:49 AM 07/15/2022   10:40 AM  Depression screen PHQ 2/9  Decreased Interest 0 0 0 1 0  Down, Depressed, Hopeless 0 0 0 1 0  PHQ - 2 Score 0 0 0 2 0  Altered sleeping  0 0 0 0  Tired, decreased energy  0 0 0 0  Change in appetite  0 0 0 0  Feeling bad or failure about yourself   0 0 0 0  Trouble concentrating  0 0 0 0  Moving slowly or fidgety/restless  0 0 0 0  Suicidal thoughts  0 0 0 0  PHQ-9 Score  0 0 2 0  Difficult doing work/chores  Not difficult at all Not difficult at all Somewhat difficult     phq 9 is negative  Fall Risk:    07/21/2023   11:24 AM 05/04/2023   11:07 AM 03/21/2023   10:51 AM 11/11/2022   11:21 AM 11/11/2022   10:46 AM  Fall Risk   Falls in the past year? 0 0 0 1 1   Number falls in past yr: 0 0 0 0 0  Injury with Fall? 0 0 0 0 0  Risk for fall due to : No Fall Risks Impaired balance/gait;History of fall(s) No Fall Risks Other (Comment) History of fall(s)  Follow up Falls evaluation completed Falls prevention discussed;Falls evaluation completed Falls prevention discussed;Education provided;Falls evaluation completed Education provided Falls prevention discussed;Education provided;Falls evaluation completed     Assessment & Plan Type 2 diabetes mellitus with proliferative retinopathy and diabetic chronic kidney disease A1c improved to 7.2. Blood sugar control essential for retinopathy and kidney disease management. Retinopathy monitored by ophthalmologist. - Reduce metformin  to 500 mg once daily. - Add pioglitazone to replace second metformin  dose. - Continue repaglinide  before meals. - Continue regular ophthalmologic exams.  Unintentional weight loss 12-pound weight loss over past year. Potential causes include metformin  and dietary changes. Concerns about weight loss in elderly. She prefers not to undergo extensive cancer screening. - Reduce metformin  to 500 mg once daily and add pioglitazone 15 mg daily  - Encourage increased caloric intake, including protein supplements like Glucerna - Consider mammogram and stool test for blood if desired.  Dyslipidemia associated with type 2 diabetes Managed with atorvastatin  40 mg daily. No side effects reported. - Continue atorvastatin  40 mg daily. - Refill atorvastatin  prescription.  Allergic rhinitis Managed with montelukast . No current symptoms. Takes montelukast  daily for allergies. - Continue montelukast  daily. - Refill montelukast  prescription.  Osteopenia Low fracture risk. Emphasis on adequate calcium  and vitamin D  intake. - Ensure high calcium  diet with foods like broccoli and fortified orange juice. - Continue vitamin D  supplementation.  Sphenoidal meningioma Slight enlargement noted from  2020 to 2022. No symptoms. Decision to stop regular imaging unless symptoms develop. - Monitor for symptoms such as headaches, diplopia, or balance issues. - Return for imaging if symptoms develop.

## 2023-08-30 ENCOUNTER — Emergency Department
Admission: EM | Admit: 2023-08-30 | Discharge: 2023-08-30 | Disposition: A | Discharge status: Home / Self Care | Attending: Emergency Medicine | Admitting: Emergency Medicine

## 2023-08-30 ENCOUNTER — Encounter: Payer: Self-pay | Admitting: Emergency Medicine

## 2023-08-30 ENCOUNTER — Other Ambulatory Visit: Payer: Self-pay

## 2023-08-30 ENCOUNTER — Emergency Department

## 2023-08-30 ENCOUNTER — Telehealth: Payer: Self-pay

## 2023-08-30 DIAGNOSIS — Y9301 Activity, walking, marching and hiking: Secondary | ICD-10-CM | POA: Diagnosis not present

## 2023-08-30 DIAGNOSIS — S025XXA Fracture of tooth (traumatic), initial encounter for closed fracture: Secondary | ICD-10-CM | POA: Insufficient documentation

## 2023-08-30 DIAGNOSIS — W19XXXA Unspecified fall, initial encounter: Secondary | ICD-10-CM | POA: Insufficient documentation

## 2023-08-30 DIAGNOSIS — S42291A Other displaced fracture of upper end of right humerus, initial encounter for closed fracture: Secondary | ICD-10-CM | POA: Insufficient documentation

## 2023-08-30 DIAGNOSIS — E119 Type 2 diabetes mellitus without complications: Secondary | ICD-10-CM | POA: Insufficient documentation

## 2023-08-30 DIAGNOSIS — Y9239 Other specified sports and athletic area as the place of occurrence of the external cause: Secondary | ICD-10-CM | POA: Diagnosis not present

## 2023-08-30 DIAGNOSIS — S00511A Abrasion of lip, initial encounter: Secondary | ICD-10-CM | POA: Diagnosis not present

## 2023-08-30 DIAGNOSIS — S4991XA Unspecified injury of right shoulder and upper arm, initial encounter: Secondary | ICD-10-CM | POA: Diagnosis present

## 2023-08-30 LAB — CBG MONITORING, ED: Glucose-Capillary: 155 mg/dL — ABNORMAL HIGH (ref 70–99)

## 2023-08-30 MED ORDER — ACETAMINOPHEN 325 MG PO TABS
650.0000 mg | ORAL_TABLET | Freq: Once | ORAL | Status: AC
  Administered 2023-08-30: 650 mg via ORAL
  Filled 2023-08-30: qty 2

## 2023-08-30 MED ORDER — TRAMADOL HCL 50 MG PO TABS: 50.0000 mg | ORAL_TABLET | Freq: Four times a day (QID) | ORAL | 0 refills | Status: DC | PRN

## 2023-08-30 NOTE — ED Notes (Signed)
 X-ray at bedside.

## 2023-08-30 NOTE — ED Triage Notes (Signed)
 [facility] and had a mechanical fall. No LOC. No thinners. Was not dizzy. Unknown reason for fall. Pt has a arm and shoulder pain. Bit lip and has a chipped tooth. Declined pain medication from EMS.   EMS VS  126/78 74 100% ra

## 2023-08-30 NOTE — Telephone Encounter (Signed)
 Copied from CRM [id number]. Topic: Referral - Request for Referral >> Aug 30, 2023  4:12 PM Fredrica W wrote: Did the patient discuss referral with their provider in the last year? No (If No - schedule appointment) (If Yes - send message)  Appointment offered? No  Type of order/referral and detailed reason for visit: Patient seen at ED today had xrays and everything and has appt upcoming with ortho surgeon. Patient hurt right shoulder is 82 and lives alone. Daughter requesting home health to assist patient with daily needs. Call daughter back with any questions or concerns.   Preference of office, provider, location: N/A  If referral order, have you been seen by this specialty before? N/A (If Yes, this issue or another issue? When? Where?  Can we respond through MyChart? No

## 2023-08-30 NOTE — ED Notes (Signed)
 Fall bundle in place at this time.

## 2023-08-30 NOTE — ED Provider Notes (Addendum)
 [facility] Provider Note    Event Date/Time   First MD Initiated Contact with Patient 08/30/23 1205     (approximate)   History   Fall   HPI  Beverly [name] is a 89 y.o. female with history of diabetes, not on blood thinners who presents after mechanical fall.  She fell onto her right shoulder.  Complains only of pain in the right shoulder.  She did not hit her head, no neck pain, no back pain, no chest pain, no abdominal pain.  No lower extremity injuries.     Physical Exam   Triage Vital Signs: ED Triage Vitals  Encounter Vitals Group     BP 08/30/23 1200 (!) 141/65     Girls Systolic BP Percentile --      Girls Diastolic BP Percentile --      Boys Systolic BP Percentile --      Boys Diastolic BP Percentile --      Pulse Rate 08/30/23 1200 70     Resp 08/30/23 1200 19     Temp 08/30/23 1204 98.2 F (36.8 C)     Temp Source 08/30/23 1204 Oral     SpO2 08/30/23 1200 100 %     Weight --      Height --      Head Circumference --      Peak Flow --      Pain Score 08/30/23 1201 7     Pain Loc --      Pain Education --      Exclude from Growth Chart --     Most recent vital signs: Vitals:   08/30/23 1204 08/30/23 1230  BP:  (!) 130/56  Pulse:  76  Resp:  10  Temp: 98.2 F (36.8 C)   SpO2:  100%     General: Awake, no distress.  CV:  Good peripheral perfusion.  Resp:  Normal effort.  Abd:  No distention.  Other:  Mild tenderness along the proximal anterior humerus, warm and well-perfused distally.  No vertebral tenderness to palpation, no chest wall tenderness, no abdominal tenderness to palpation, normal range of motion of the lower extremities with no pain with axial load on both hips  Chipped right front upper tooth, lower lip abrasion   ED Results / Procedures / Treatments   Labs (all labs ordered are listed, but only abnormal results are displayed) Labs Reviewed  CBG MONITORING, ED - Abnormal; Notable for the following  components:      Result Value   Glucose-Capillary 155 (*)    All other components within normal limits     EKG     RADIOLOGY  Shoulder x-ray   PROCEDURES:  Critical Care performed:   Procedures   MEDICATIONS ORDERED IN ED: Medications  acetaminophen  (TYLENOL ) tablet 650 mg (has no administration in time range)     IMPRESSION / MDM / ASSESSMENT AND PLAN / ED COURSE  I reviewed the triage vital signs and the nursing notes. Patient's presentation is most consistent with acute complicated illness / injury requiring diagnostic workup.  Patient was observed fall as detailed above, primary complaint is right shoulder pain, also has chipped tooth she will follow-up with dentist for this  X-ray pending, has declined pain medication at this time  Shoulder x-ray demonstrates humeral head fracture, discussed with Dr. Edie  shoulder immobilizer, will follow-up as an outpatient  She reports her daughter and her neighbors will be able to assist her as  needed, discussed side effects of analgesics prescribed, close outpatient follow-up with orthopedics, return precautions discussed       FINAL CLINICAL IMPRESSION(S) / ED DIAGNOSES   Final diagnoses:  Fall, initial encounter  Closed fracture of head of right humerus, initial encounter     Rx / DC Orders   ED Discharge Orders          Ordered    traMADol  (ULTRAM ) 50 MG tablet  Every 6 hours PRN        08/30/23 1359             Note:  This document was prepared using Dragon voice recognition software and may include unintentional dictation errors.   Arlander Charleston, MD 08/30/23 1407    Arlander Charleston, MD 08/30/23 [phone]

## 2023-08-31 NOTE — Telephone Encounter (Signed)
 Tried calling pt not able to leave message.

## 2023-11-24 ENCOUNTER — Encounter: Payer: Self-pay | Admitting: Family Medicine

## 2023-11-24 ENCOUNTER — Ambulatory Visit: Admitting: Family Medicine

## 2023-11-24 VITALS — BP 124/72 | HR 75 | Resp 16 | Ht 65.0 in | Wt 179.9 lb

## 2023-11-24 DIAGNOSIS — E785 Hyperlipidemia, unspecified: Secondary | ICD-10-CM | POA: Diagnosis not present

## 2023-11-24 DIAGNOSIS — D329 Benign neoplasm of meninges, unspecified: Secondary | ICD-10-CM | POA: Diagnosis not present

## 2023-11-24 DIAGNOSIS — Z23 Encounter for immunization: Secondary | ICD-10-CM

## 2023-11-24 DIAGNOSIS — E1129 Type 2 diabetes mellitus with other diabetic kidney complication: Secondary | ICD-10-CM | POA: Diagnosis not present

## 2023-11-24 DIAGNOSIS — E538 Deficiency of other specified B group vitamins: Secondary | ICD-10-CM

## 2023-11-24 DIAGNOSIS — Z862 Personal history of diseases of the blood and blood-forming organs and certain disorders involving the immune mechanism: Secondary | ICD-10-CM

## 2023-11-24 DIAGNOSIS — J3089 Other allergic rhinitis: Secondary | ICD-10-CM

## 2023-11-24 DIAGNOSIS — E1169 Type 2 diabetes mellitus with other specified complication: Secondary | ICD-10-CM

## 2023-11-24 DIAGNOSIS — G936 Cerebral edema: Secondary | ICD-10-CM | POA: Diagnosis not present

## 2023-11-24 DIAGNOSIS — R809 Proteinuria, unspecified: Secondary | ICD-10-CM

## 2023-11-24 DIAGNOSIS — E113553 Type 2 diabetes mellitus with stable proliferative diabetic retinopathy, bilateral: Secondary | ICD-10-CM

## 2023-11-24 DIAGNOSIS — M8580 Other specified disorders of bone density and structure, unspecified site: Secondary | ICD-10-CM

## 2023-11-24 DIAGNOSIS — Z8781 Personal history of (healed) traumatic fracture: Secondary | ICD-10-CM

## 2023-11-24 DIAGNOSIS — R600 Localized edema: Secondary | ICD-10-CM

## 2023-11-24 DIAGNOSIS — Z7984 Long term (current) use of oral hypoglycemic drugs: Secondary | ICD-10-CM

## 2023-11-24 LAB — POCT GLYCOSYLATED HEMOGLOBIN (HGB A1C): Hemoglobin A1C: 7 % — AB (ref 4.0–5.6)

## 2023-11-24 MED ORDER — METFORMIN HCL 500 MG PO TABS: 500.0000 mg | ORAL_TABLET | Freq: Every day | ORAL | 1 refills | Status: AC

## 2023-11-24 MED ORDER — PIOGLITAZONE HCL 15 MG PO TABS: 15.0000 mg | ORAL_TABLET | Freq: Every day | ORAL | 1 refills | Status: AC

## 2023-11-24 MED ORDER — MONTELUKAST SODIUM 10 MG PO TABS: 10.0000 mg | ORAL_TABLET | Freq: Every day | ORAL | 1 refills | Status: AC

## 2023-11-24 MED ORDER — REPAGLINIDE 2 MG PO TABS: 2.0000 mg | ORAL_TABLET | Freq: Two times a day (BID) | ORAL | 1 refills | Status: AC

## 2023-11-24 MED ORDER — ATORVASTATIN CALCIUM 40 MG PO TABS: 40.0000 mg | ORAL_TABLET | Freq: Every day | ORAL | 1 refills | Status: AC

## 2023-11-24 NOTE — Progress Notes (Signed)
 Name: Beverly [name]   MRN: [id number]    DOB: Oct 13, 1934   Date:11/24/2023       Progress Note  Subjective  Chief Complaint  Chief Complaint  Patient presents with   Medical Management of Chronic Issues   Discussed the use of AI scribe software for clinical note transcription with the patient, who gave verbal consent to proceed.  History of Present Illness Beverly [name] is an 89 year old female with type 2 diabetes and osteopenia who presents for a regular follow-up visit.  She experienced a fall on August 27, resulting in a right proximal humerus fracture. She was briefly hospitalized and discharged the same day with her shoulder immobilized in a sling. She stayed with her daughter for two months to aid in recovery and has been undergoing physical and occupational therapy at home. Although the fracture has healed, she continues to have limited range of motion, unable to reach high shelves and requiring items to be placed lower. No pain is reported, but she acknowledges the need to continue exercising to improve mobility.  She has a history of osteopenia and is due for a bone density test in March. She takes vitamin D , calcium , and vitamin C  supplements. She attributes the fall to an unexpected loss of balance while walking with a group.  Her type 2 diabetes is managed with metformin  500 mg once daily, pioglitazone  50 mg once daily, and repaglinide  2 mg twice daily before meals. No episodes of hypoglycemia, excessive hunger, thirst, or frequent urination are reported. Her last A1c was 7.0%, down from 7.2%.  She has dyslipidemia and is taking atorvastatin  40 mg daily without any side effects. Her cholesterol levels are well-controlled, with a recent LDL of 62 mg/dL.  She has a history of microalbuminuria, which has improved without specific medication due to an allergy to lisinopril. Her proteinuria has normalized, with recent levels below 30 mg/dL.  She has a history of iron deficiency  anemia, but her hemoglobin levels are currently normal. She continues to take iron supplements. Her vitamin B12, folic acid, and vitamin D  levels are within normal ranges, although vitamin D  was slightly elevated at 74 ng/mL.  She has stable proliferative retinopathy in both eyes and needs to reschedule an eye doctor appointment that was missed due to her shoulder injury. No vision changes are reported.  She has a meningioma with mild vasogenic edema noted in 2022. No headaches, vision changes, or olfactory changes are reported. Last visit with neurosurgeon was two years ago and during the visit they decided to stop imaging and go back if needed for follow up  She experiences swelling in her legs, which has been more pronounced since her fall. The swelling is described as tight and non-pitting, with no associated shortness of breath or chest pain. She notes that her legs feel softer today compared to usual.    Patient Active Problem List   Diagnosis Date Noted   History of iron deficiency anemia 07/21/2023   B12 deficiency 11/10/2021   Mild protein-calorie malnutrition 11/10/2021   History of subdural hematoma 11/10/2021   Meningioma (HCC) 11/10/2020   Iron deficiency anemia    Type 2 diabetes mellitus with stable proliferative retinopathy of both eyes, without long-term current use of insulin  (HCC) 02/27/2018   Type 2 diabetes mellitus with microalbuminuria, without long-term current use of insulin  (HCC) 10/24/2017   Diabetic retinopathy (HCC) 11/12/2015   Osteoarthritis 06/02/2015   Osteopenia after menopause 06/02/2015   Narrowing of intervertebral disc  space 06/02/2015   Dyslipidemia associated with type 2 diabetes mellitus (HCC) 06/02/2015   Perennial allergic rhinitis 02/28/2007   Essential (primary) hypertension 06/26/2006   Pure hypercholesterolemia 06/26/2006    Past Surgical History:  Procedure Laterality Date   ABDOMINAL HYSTERECTOMY     BUNIONECTOMY     COLONOSCOPY      COLONOSCOPY WITH PROPOFOL  N/A 09/24/2018   Procedure: COLONOSCOPY WITH PROPOFOL ;  Surgeon: Unk Corinn Skiff, MD;  Location: [facility] ENDOSCOPY;  Service: Gastroenterology;  Laterality: N/A;   ESOPHAGOGASTRODUODENOSCOPY (EGD) WITH PROPOFOL  N/A 09/24/2018   Procedure: ESOPHAGOGASTRODUODENOSCOPY (EGD) WITH PROPOFOL ;  Surgeon: Unk Corinn Skiff, MD;  Location: [facility] ENDOSCOPY;  Service: Gastroenterology;  Laterality: N/A;   GIVENS CAPSULE STUDY N/A 10/11/2018   Procedure: GIVENS CAPSULE STUDY;  Surgeon: Unk Corinn Skiff, MD;  Location: [facility] ENDOSCOPY;  Service: Gastroenterology;  Laterality: N/A;   HERNIA REPAIR      Family History  Problem Relation Age of Onset   Hypertension Mother    Healthy Father    Healthy Daughter    Raynaud syndrome Daughter    Hypertension Daughter    Stroke Sister     Social History   Tobacco Use   Smoking status: Never   Smokeless tobacco: Never   Tobacco comments:    smoking cessation materials not required  Substance Use Topics   Alcohol use: No    Alcohol/week: 0.0 standard drinks of alcohol     Current Outpatient Medications:    acetaminophen  (TYLENOL ) 650 MG CR tablet, Take 1 tablet by mouth every 6 (six) hours as needed., Disp: , Rfl:    atorvastatin  (LIPITOR) 40 MG tablet, Take 1 tablet (40 mg total) by mouth daily., Disp: 90 tablet, Rfl: 1   Cholecalciferol (VITAMIN D ) 2000 units CAPS, Take 1 capsule (2,000 Units total) by mouth daily., Disp: 30 capsule, Rfl: 0   ferrous sulfate  325 (65 FE) MG tablet, Take 325 mg by mouth 2 (two) times daily with a meal. , Disp: , Rfl:    Glucosamine Sulfate 500 MG TABS, Take 1 tablet by mouth once a week. , Disp: , Rfl:    loratadine  (CLARITIN ) 10 MG tablet, Take 1 tablet (10 mg total) by mouth daily., Disp: 90 tablet, Rfl: 1   metFORMIN  (GLUCOPHAGE ) 500 MG tablet, Take 1 tablet (500 mg total) by mouth daily with breakfast., Disp: 90 tablet, Rfl: 1   montelukast  (SINGULAIR ) 10 MG tablet, Take 1 tablet (10 mg  total) by mouth at bedtime., Disp: 90 tablet, Rfl: 1   Multiple Vitamin (MULTI-VITAMINS) TABS, Take by mouth., Disp: , Rfl:    pioglitazone  (ACTOS ) 15 MG tablet, Take 1 tablet (15 mg total) by mouth daily., Disp: 90 tablet, Rfl: 1   repaglinide  (PRANDIN ) 2 MG tablet, Take 1 tablet (2 mg total) by mouth 2 (two) times daily before a meal., Disp: 180 tablet, Rfl: 1   traMADol  (ULTRAM ) 50 MG tablet, Take 1 tablet (50 mg total) by mouth every 6 (six) hours as needed., Disp: 20 tablet, Rfl: 0   vitamin B-12 (CYANOCOBALAMIN ) 1000 MCG tablet, Take 1,000 mcg by mouth 3 (three) times a week., Disp: , Rfl:    vitamin C  (ASCORBIC ACID ) 500 MG tablet, Take 500 mg by mouth daily., Disp: , Rfl:    vitamin E 180 MG (400 UNITS) capsule, Take 400 Units by mouth daily., Disp: , Rfl:    Zinc Acetate 50 MG CAPS, Take by mouth daily. , Disp: , Rfl:   Allergies  Allergen Reactions  Lisinopril     I personally reviewed active problem list, medication list, allergies, family history with the patient/caregiver today.   ROS  Ten systems reviewed and is negative except as mentioned in HPI    Objective Physical Exam CONSTITUTIONAL: Patient appears well-developed and well-nourished. No distress. HEENT: Head atraumatic, normocephalic, neck supple. CARDIOVASCULAR: Normal rate, regular rhythm and normal heart sounds. No murmur heard. Bilateral leg swelling, non-pitting edema. PULMONARY: Effort normal and breath sounds normal. Lungs clear to auscultation. No respiratory distress. ABDOMINAL: There is no tenderness or distention. MUSCULOSKELETAL: Normal gait. Without gross motor or sensory deficit. PSYCHIATRIC: Patient has a normal mood and affect. Behavior is normal. Judgment and thought content normal.  Vitals:   11/24/23 1032  BP: 124/72  Pulse: 75  Resp: 16  SpO2: 97%  Weight: 179 lb 14.4 oz (81.6 kg)  Height: 5' 5 (1.651 m)    Body mass index is 29.94 kg/m.  Recent Results (from the past 2160 hours)   CBG monitoring, ED     Status: Abnormal   Collection Time: 08/30/23 12:04 PM  Result Value Ref Range   Glucose-Capillary 155 (H) 70 - 99 mg/dL    Comment: Glucose reference range applies only to samples taken after fasting for at least 8 hours.  POCT glycosylated hemoglobin (Hb A1C)     Status: Abnormal   Collection Time: 11/24/23 10:38 AM  Result Value Ref Range   Hemoglobin A1C 7.0 (A) 4.0 - 5.6 %   HbA1c POC (<> result, manual entry)     HbA1c, POC (prediabetic range)     HbA1c, POC (controlled diabetic range)      Diabetic Foot Exam:     PHQ2/9:    11/24/2023   10:22 AM 07/21/2023   11:24 AM 05/04/2023   11:03 AM 03/21/2023   10:51 AM 11/11/2022   10:49 AM  Depression screen PHQ 2/9  Decreased Interest 0 0 0 0 1  Down, Depressed, Hopeless 0 0 0 0 1  PHQ - 2 Score 0 0 0 0 2  Altered sleeping   0 0 0  Tired, decreased energy   0 0 0  Change in appetite   0 0 0  Feeling bad or failure about yourself    0 0 0  Trouble concentrating   0 0 0  Moving slowly or fidgety/restless   0 0 0  Suicidal thoughts   0 0 0  PHQ-9 Score   0  0  2   Difficult doing work/chores   Not difficult at all Not difficult at all Somewhat difficult     Data saved with a previous flowsheet row definition    phq 9 is negative  Fall Risk:    11/24/2023   10:22 AM 07/21/2023   11:24 AM 05/04/2023   11:07 AM 03/21/2023   10:51 AM 11/11/2022   11:21 AM  Fall Risk   Falls in the past year? 0 0 0 0 1  Number falls in past yr: 0 0 0 0 0  Injury with Fall? 0 0 0 0 0  Risk for fall due to : No Fall Risks No Fall Risks Impaired balance/gait;History of fall(s) No Fall Risks Other (Comment)  Follow up Falls evaluation completed Falls evaluation completed Falls prevention discussed;Falls evaluation completed Falls prevention discussed;Education provided;Falls evaluation completed Education provided     Assessment & Plan Type 2 diabetes mellitus with stable proliferative diabetic retinopathy and  microalbuminuria, dyslipidemia  Diabetes well-controlled, A1c improved to 7.0%. Microalbuminuria normalized.  No need for additional kidney protection due to lisinopril allergy and proteinuria normalization. - Continue metformin  500 mg once daily. - Continue pioglitazone  50 mg once daily. - Continue repaglinide  2 mg twice daily before meals. - Ensure follow-up with eye doctor for diabetic retinopathy.  Dyslipidemia Well-controlled with atorvastatin . LDL at 62 mg/dL, within target. - Continue atorvastatin  40 mg daily. - Renewed atorvastatin  prescription for December.  Osteopenia, postmenopausal Postmenopausal osteopenia with recent right proximal humerus fracture. Bone density due for repeat testing in March. - Ordered bone density test for March.  Right proximal humerus fracture, healing/resolved Fracture healed, no surgery required. Some range of motion regained. - Continue exercises to improve range of motion.  Bilateral lower extremities Persistent non-pitting bilateral lower extremity swelling, likely lymphedema . No shortness of breath or chest pain. - Referred to vascular surgeon for evaluation and management of lymphedema.  Benign meningioma with mild vasogenic edema, left frontal lobe Benign meningioma with mild vasogenic edema. - reminded her to contact us  if symptoms of headache, double vision, hallucinations...  Allergic rhinitis Well-controlled with montelukast . No congestion symptoms. - Continue montelukast  daily.  General Health Maintenance Flu shot received. No mammograms needed due to age. - Continue routine health maintenance.

## 2023-11-27 ENCOUNTER — Telehealth: Payer: Self-pay | Admitting: Family Medicine

## 2023-11-27 NOTE — Telephone Encounter (Signed)
 Called pt back no answer, unable to leave vm

## 2023-11-27 NOTE — Telephone Encounter (Signed)
 Copied from CRM #[id number]. Topic: Referral - Question >> Nov 27, 2023  9:18 AM Tonda B wrote: Reason for CRM:pt is calling has questions about referral  Referred To [facility]  please call pt back [phone] (H) [phone] (M

## 2023-11-28 ENCOUNTER — Telehealth: Payer: Self-pay

## 2023-11-28 NOTE — Telephone Encounter (Signed)
 Copied from CRM #[id number]. Topic: Referral - Status >> Nov 28, 2023  9:50 AM Myrick T wrote: Reason for CRM: patient called to find out who her vascular referral was sent to. Please f/u with patient

## 2023-11-28 NOTE — Telephone Encounter (Signed)
 Called and lvm informing it went to [facility]

## 2024-01-15 ENCOUNTER — Ambulatory Visit: Payer: Self-pay

## 2024-01-15 ENCOUNTER — Telehealth: Payer: Self-pay | Admitting: Family Medicine

## 2024-01-15 NOTE — Telephone Encounter (Signed)
 New prescription for furosemide 

## 2024-01-15 NOTE — Telephone Encounter (Signed)
Not on active list

## 2024-01-15 NOTE — Telephone Encounter (Signed)
 FYI Only or Action Required?: Action required by provider: update on patient condition and request for updated Lasix  40mg  script .  Patient was last seen in primary care on 11/24/2023 by Glenard Mire, MD.  Called Nurse Triage reporting Leg Swelling.  Symptoms began several weeks ago.  Interventions attempted: Rest, hydration, or home remedies.  Symptoms are: gradually worsening.  Triage Disposition: See Physician Within 24 Hours  Patient/caregiver understands and will follow disposition?: Yes  Copied from CRM [id number]. Topic: Clinical - Prescription Issue >> Jan 15, 2024 11:19 AM Alfonso ORN wrote: Reason for CRM:  [facility] home [facility] called to advise have been seeing her for ot seen Friday for nursing . pt has 3 plus (swollen legs) . Home health would like to know dosage and frequency of fuerosmide (pt said as needed ) but they show should be scheduled.  they would like a callback to advise. Rx is showing signed by calton molly  Callback: [phone] Reason for Disposition  [1] MODERATE leg swelling (e.g., swelling extends up to knees) AND [2] new-onset or getting worse  Answer Assessment - Initial Assessment Questions Spoke with Mylinda from [facility]. Nursing and OT out to the house on Friday- 3+pitting edema in the LE, up to the thigh. Has progressively gotten worse since injury and OP OT.  Patient was going to take doses this weekend to try to pull off fluid but the prescription has expired- last prescribed in 2014. Refill request placed to [facility] in [place].  Bilateral LE edema- patient states from knee down. RN/OT say some on the thighs bilaterally but right >left. Glossy pvd skin. Some ducky color to legs but applying cocoa/shea butter to them with improvement per pt. Podiatry appt today with no complications. Keeping feet elevated.   Denies CP, SOB, Dizzy, fever. Denies hot hard knot, temp changes.   Refill encounter was placed but hasn't been prescribed since 2014 since  it was PRN script and wasn't needed often. PLEASE ADVISE on Lasix  script  1. ONSET: When did the swelling start? (e.g., minutes, hours, days)     Ongoing worsened  2. LOCATION: What part of the leg is swollen?  Are both legs swollen or just one leg?     Bilateral legs - right leg >left leg - knee down  3. SEVERITY: How bad is the swelling? (e.g., localized; mild, moderate, severe)     3+, tight glossy PVD skin.  4. REDNESS: Is there redness or signs of infection?     Denies redness 5. PAIN: Is the swelling painful to touch? If Yes, ask: How painful is it?   (Scale 1-10; mild, moderate or severe)     denies 6. FEVER: Do you have a fever? If Yes, ask: What is it, how was it measured, and when did it start?      denies 7. CAUSE: What do you think is causing the leg swelling?     Vascular  8. MEDICAL HISTORY: Do you have a history of blood clots (e.g., DVT), cancer, heart failure, kidney disease, or liver failure?     HTN, DM  9. RECURRENT SYMPTOM: Have you had leg swelling before? If Yes, ask: When was the last time? What happened that time?     Worsening since injury 10. OTHER SYMPTOMS: Do you have any other symptoms? (e.g., chest pain, difficulty breathing)       Denies  Protocols used: Leg Swelling and Edema-A-AH

## 2024-01-16 NOTE — Telephone Encounter (Signed)
 Beverley skilled nurse with [facility] called in stating he saw the patient yesterday and she has significant edema in her lower extremities. He says she told him she used to be on Lasix  but hasn't taken it in a long time. She had a surgery back in August of last year and a Dr Fonda Koyanagi is the one who set up a referral for [facility] due to the edema. Beverley called just to report the edema to the provider and to see if she will put her back on the Lasix . Please assist patient further

## 2024-01-17 ENCOUNTER — Encounter: Payer: Self-pay | Admitting: Family Medicine

## 2024-01-17 ENCOUNTER — Ambulatory Visit (INDEPENDENT_AMBULATORY_CARE_PROVIDER_SITE_OTHER): Admitting: Family Medicine

## 2024-01-17 VITALS — BP 130/72 | HR 76 | Resp 16 | Ht 65.0 in | Wt 176.4 lb

## 2024-01-17 DIAGNOSIS — R600 Localized edema: Secondary | ICD-10-CM | POA: Diagnosis not present

## 2024-01-17 MED ORDER — POTASSIUM CHLORIDE CRYS ER 20 MEQ PO TBCR: 20.0000 meq | EXTENDED_RELEASE_TABLET | Freq: Every day | ORAL | 0 refills | Status: AC

## 2024-01-17 MED ORDER — FUROSEMIDE 20 MG PO TABS: 20.0000 mg | ORAL_TABLET | Freq: Every morning | ORAL | 0 refills | Status: AC

## 2024-01-17 NOTE — Patient Instructions (Signed)
 [facility]  Address: [address], [place], [place] [address] Phone: [phone]

## 2024-01-17 NOTE — Progress Notes (Signed)
 Name: Beverly [name]   MRN: [id number]    DOB: Aug 16, 1934   Date:01/17/2024       Progress Note  Subjective  Chief Complaint  Chief Complaint  Patient presents with   Leg Swelling    X1 week, bilateral legs   Discussed the use of AI scribe software for clinical note transcription with the patient, who gave verbal consent to proceed.  History of Present Illness Beverly [name] is an 89 year old female with a history of lower extremity edema who presents with worsening leg swelling.  She has experienced worsening leg swelling over the past week without any apparent trigger. The swelling is described as 'retaining water.' She attempted to use an old prescription of furosemide  from 2014 - furosemide  , which was ineffective.  She has a history of lower extremity edema, previously noted in November 2025. At that time, she was referred to a vascular surgeon, but she did not follow up on the referral. The swelling has worsened since a fall on August 30, 2023, and she has been less active due to shoulder pain from the fall.  No shortness of breath, orthopnea, or chest pain. She is not consuming more salt than usual. She has not experienced this level of swelling before.  Her current medications include pioglitazone  for diabetes, which she has been taking for a while without previous issues of swelling.    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, without long-term current use of insulin  (HCC) 02/27/2018   Type 2 diabetes mellitus with microalbuminuria, without long-term current use of insulin  (HCC) 10/24/2017   Diabetic retinopathy (HCC) 11/12/2015    Osteoarthritis 06/02/2015   Osteopenia after menopause 06/02/2015   Narrowing of intervertebral disc space 06/02/2015   Dyslipidemia associated with type 2 diabetes mellitus (HCC) 06/02/2015   Perennial allergic rhinitis 02/28/2007   Essential (primary) hypertension 06/26/2006   Pure hypercholesterolemia 06/26/2006    Past Surgical History:  Procedure Laterality Date   ABDOMINAL HYSTERECTOMY     BUNIONECTOMY     COLONOSCOPY     COLONOSCOPY WITH PROPOFOL  N/A 09/24/2018   Procedure: COLONOSCOPY WITH PROPOFOL ;  Surgeon: Unk Corinn Skiff, MD;  Location: [facility] ENDOSCOPY;  Service: Gastroenterology;  Laterality: N/A;   ESOPHAGOGASTRODUODENOSCOPY (EGD) WITH PROPOFOL  N/A 09/24/2018   Procedure: ESOPHAGOGASTRODUODENOSCOPY (EGD) WITH PROPOFOL ;  Surgeon: Unk Corinn Skiff, MD;  Location: [facility] ENDOSCOPY;  Service: Gastroenterology;  Laterality: N/A;   GIVENS CAPSULE STUDY N/A 10/11/2018   Procedure: GIVENS CAPSULE STUDY;  Surgeon: Unk Corinn Skiff, MD;  Location: [facility] ENDOSCOPY;  Service: Gastroenterology;  Laterality: N/A;   HERNIA REPAIR      Family History  Problem Relation Age of Onset   Hypertension Mother    Healthy Father    Healthy Daughter    Raynaud syndrome Daughter    Hypertension Daughter    Stroke Sister     Social History   Tobacco Use   Smoking status: Never   Smokeless tobacco: Never   Tobacco comments:    smoking cessation materials not required  Substance Use Topics   Alcohol use: No    Alcohol/week: 0.0 standard drinks of alcohol  Current Medications[1]  Allergies[2]  I personally reviewed active problem list, medication list, allergies with the patient/caregiver today.   ROS  Ten systems reviewed and is negative except as mentioned in HPI    Objective Physical Exam CONSTITUTIONAL: Patient appears well-developed and well-nourished. No distress. HEENT: Head atraumatic, normocephalic, neck supple. CARDIOVASCULAR: Normal rate, regular rhythm  and normal heart sounds. No murmur heard. Bilateral lower extremity edema with skin tight and thin. Mostly non pitting  PULMONARY: Effort normal and breath sounds normal. Lungs clear to auscultation. No respiratory distress. MUSCULOSKELETAL: slow gait, decrease rom of right shoulder  PSYCHIATRIC: Patient has a normal mood and affect. Behavior is normal. Judgment and thought content normal.     Vitals:   01/17/24 1116  BP: 130/72  Pulse: 76  Resp: 16  SpO2: 97%  Weight: 176 lb 6.4 oz (80 kg)  Height: 5' 5 (1.651 m)    Body mass index is 29.35 kg/m.  Recent Results (from the past 2160 hours)  POCT glycosylated hemoglobin (Hb A1C)     Status: Abnormal   Collection Time: 11/24/23 10:38 AM  Result Value Ref Range   Hemoglobin A1C 7.0 (A) 4.0 - 5.6 %   HbA1c POC (<> result, manual entry)     HbA1c, POC (prediabetic range)     HbA1c, POC (controlled diabetic range)        PHQ2/9:    01/17/2024   11:11 AM 11/24/2023   10:22 AM 07/21/2023   11:24 AM 05/04/2023   11:03 AM 03/21/2023   10:51 AM  Depression screen PHQ 2/9  Decreased Interest 0 0 0 0 0  Down, Depressed, Hopeless 0 0 0 0 0  PHQ - 2 Score 0 0 0 0 0  Altered sleeping    0 0  Tired, decreased energy    0 0  Change in appetite    0 0  Feeling bad or failure about yourself     0 0  Trouble concentrating    0 0  Moving slowly or fidgety/restless    0 0  Suicidal thoughts    0 0  PHQ-9 Score    0  0   Difficult doing work/chores    Not difficult at all Not difficult at all     Data saved with a previous flowsheet row definition    phq 9 is negative  Fall Risk:    01/17/2024   11:11 AM 11/24/2023   10:22 AM 07/21/2023   11:24 AM 05/04/2023   11:07 AM 03/21/2023   10:51 AM  Fall Risk   Falls in the past year? 0 0 0 0 0  Number falls in past yr: 0 0 0 0 0  Injury with Fall? 0 0  0  0  0   Risk for fall due to : No Fall Risks No Fall Risks No Fall Risks Impaired balance/gait;History of fall(s) No Fall Risks   Follow up Falls evaluation completed Falls evaluation completed Falls evaluation completed Falls prevention discussed;Falls evaluation completed Falls prevention discussed;Education provided;Falls evaluation completed     Data saved with a previous flowsheet row definition      Assessment & Plan Bilateral lower extremity lymphedema Persistent non-pitting edema, likely lymphedema. Previous vascular surgeon referral incomplete. Right shoulder pain may reduce mobility, worsening edema. - Referred to vascular surgeon for evaluation and management. - Prescribed furosemide  10 tablets, morning and night, with potassium supplement, but explained it will likely not work  - Advised leg elevation above heart level. -  Instructed on leg pumping exercises. - Provided contact information for vascular surgeon and pharmacy details.        [1]  Current Outpatient Medications:    acetaminophen  (TYLENOL ) 650 MG CR tablet, Take 1 tablet by mouth every 6 (six) hours as needed., Disp: , Rfl:    atorvastatin  (LIPITOR) 40 MG tablet, Take 1 tablet (40 mg total) by mouth daily., Disp: 90 tablet, Rfl: 1   Cholecalciferol (VITAMIN D ) 2000 units CAPS, Take 1 capsule (2,000 Units total) by mouth daily., Disp: 30 capsule, Rfl: 0   ferrous sulfate  325 (65 FE) MG tablet, Take 325 mg by mouth 2 (two) times daily with a meal. , Disp: , Rfl:    Glucosamine Sulfate 500 MG TABS, Take 1 tablet by mouth once a week. , Disp: , Rfl:    loratadine  (CLARITIN ) 10 MG tablet, Take 1 tablet (10 mg total) by mouth daily., Disp: 90 tablet, Rfl: 1   metFORMIN  (GLUCOPHAGE ) 500 MG tablet, Take 1 tablet (500 mg total) by mouth daily with breakfast., Disp: 90 tablet, Rfl: 1   montelukast  (SINGULAIR ) 10 MG tablet, Take 1 tablet (10 mg total) by mouth at bedtime., Disp: 90 tablet, Rfl: 1   Multiple Vitamin (MULTI-VITAMINS) TABS, Take by mouth., Disp: , Rfl:    pioglitazone  (ACTOS ) 15 MG tablet, Take 1 tablet (15 mg total) by mouth daily.,  Disp: 90 tablet, Rfl: 1   repaglinide  (PRANDIN ) 2 MG tablet, Take 1 tablet (2 mg total) by mouth 2 (two) times daily before a meal., Disp: 180 tablet, Rfl: 1   vitamin B-12 (CYANOCOBALAMIN ) 1000 MCG tablet, Take 1,000 mcg by mouth 3 (three) times a week., Disp: , Rfl:    vitamin C  (ASCORBIC ACID ) 500 MG tablet, Take 500 mg by mouth daily., Disp: , Rfl:    vitamin E 180 MG (400 UNITS) capsule, Take 400 Units by mouth daily., Disp: , Rfl:    Zinc Acetate 50 MG CAPS, Take by mouth daily. , Disp: , Rfl:  [2]  Allergies Allergen Reactions   Lisinopril

## 2024-01-24 ENCOUNTER — Telehealth: Payer: Self-pay

## 2024-01-24 NOTE — Telephone Encounter (Signed)
 Copied from CRM #[id number]. Topic: Clinical - Medication Question >> Jan 24, 2024 11:13 AM Revonda D wrote: Reason for CRM: Pt wanted to inform Dr.Sowles that she is done taking the water pills and has lost 15 pounds. Pt stated that her legs feel and look better and wanted to know if/what Dr.Sowles has planned next for her. Pt would like a callback with an update.

## 2024-03-15 ENCOUNTER — Ambulatory Visit: Payer: Medicare HMO | Admitting: Family Medicine

## 2024-03-25 ENCOUNTER — Ambulatory Visit: Admitting: Family Medicine

## 2024-05-16 ENCOUNTER — Ambulatory Visit
